# Patient Record
Sex: Female | Born: 1935
Health system: Southern US, Community
[De-identification: ages and names within clinical notes are randomized; demographics above are authoritative.]

## PROBLEM LIST (undated history)

## (undated) DIAGNOSIS — K831 Obstruction of bile duct: Secondary | ICD-10-CM

## (undated) DIAGNOSIS — M810 Age-related osteoporosis without current pathological fracture: Secondary | ICD-10-CM

## (undated) DIAGNOSIS — C169 Malignant neoplasm of stomach, unspecified: Secondary | ICD-10-CM

## (undated) DIAGNOSIS — E785 Hyperlipidemia, unspecified: Secondary | ICD-10-CM

## (undated) DIAGNOSIS — E559 Vitamin D deficiency, unspecified: Secondary | ICD-10-CM

## (undated) DIAGNOSIS — E538 Deficiency of other specified B group vitamins: Secondary | ICD-10-CM

## (undated) DIAGNOSIS — I10 Essential (primary) hypertension: Secondary | ICD-10-CM

## (undated) DIAGNOSIS — A3790 Whooping cough, unspecified species without pneumonia: Secondary | ICD-10-CM

## (undated) DIAGNOSIS — K219 Gastro-esophageal reflux disease without esophagitis: Secondary | ICD-10-CM

## (undated) DIAGNOSIS — I495 Sick sinus syndrome: Secondary | ICD-10-CM

## (undated) DIAGNOSIS — M51369 Other intervertebral disc degeneration, lumbar region without mention of lumbar back pain or lower extremity pain: Secondary | ICD-10-CM

## (undated) DIAGNOSIS — M5136 Other intervertebral disc degeneration, lumbar region: Secondary | ICD-10-CM

## (undated) DIAGNOSIS — N182 Chronic kidney disease, stage 2 (mild): Secondary | ICD-10-CM

## (undated) DIAGNOSIS — E119 Type 2 diabetes mellitus without complications: Secondary | ICD-10-CM

## (undated) DIAGNOSIS — J309 Allergic rhinitis, unspecified: Secondary | ICD-10-CM

## (undated) DIAGNOSIS — I4891 Unspecified atrial fibrillation: Secondary | ICD-10-CM

## (undated) DIAGNOSIS — H3530 Unspecified macular degeneration: Secondary | ICD-10-CM

## (undated) DIAGNOSIS — M1990 Unspecified osteoarthritis, unspecified site: Secondary | ICD-10-CM

## (undated) DIAGNOSIS — D649 Anemia, unspecified: Secondary | ICD-10-CM

## (undated) HISTORY — DX: Sick sinus syndrome: I49.5

## (undated) HISTORY — DX: Deficiency of other specified B group vitamins: E53.8

## (undated) HISTORY — DX: Other intervertebral disc degeneration, lumbar region without mention of lumbar back pain or lower extremity pain: M51.369

## (undated) HISTORY — DX: Obstruction of bile duct: K83.1

## (undated) HISTORY — DX: Anemia, unspecified: D64.9

## (undated) HISTORY — DX: Age-related osteoporosis without current pathological fracture: M81.0

## (undated) HISTORY — DX: Unspecified macular degeneration: H35.30

## (undated) HISTORY — DX: Other intervertebral disc degeneration, lumbar region: M51.36

## (undated) HISTORY — PX: BACK SURGERY: SHX140

## (undated) HISTORY — DX: Whooping cough, unspecified species without pneumonia: A37.90

## (undated) HISTORY — PX: CHOLECYSTECTOMY: SHX55

## (undated) HISTORY — DX: Unspecified osteoarthritis, unspecified site: M19.90

## (undated) HISTORY — DX: Chronic kidney disease, stage 2 (mild): N18.2

## (undated) HISTORY — DX: Vitamin D deficiency, unspecified: E55.9

## (undated) HISTORY — DX: Allergic rhinitis, unspecified: J30.9

## (undated) HISTORY — DX: Gastro-esophageal reflux disease without esophagitis: K21.9

## (undated) HISTORY — DX: Hyperlipidemia, unspecified: E78.5

## (undated) HISTORY — DX: Type 2 diabetes mellitus without complications: E11.9

## (undated) HISTORY — DX: Unspecified atrial fibrillation: I48.91

---

## 1966-01-25 HISTORY — PX: PARTIAL HYSTERECTOMY: SHX80

## 1989-01-25 HISTORY — PX: TOTAL ABDOMINAL HYSTERECTOMY: SHX209

## 1990-01-25 HISTORY — PX: PARTIAL GASTRECTOMY: SHX2172

## 1997-09-16 ENCOUNTER — Emergency Department (HOSPITAL_COMMUNITY): Admission: EM | Admit: 1997-09-16 | Discharge: 1997-09-16 | Payer: Self-pay | Admitting: Emergency Medicine

## 1997-11-08 ENCOUNTER — Other Ambulatory Visit: Admission: RE | Admit: 1997-11-08 | Discharge: 1997-11-08 | Payer: Self-pay | Admitting: Obstetrics and Gynecology

## 1998-04-04 ENCOUNTER — Emergency Department (HOSPITAL_COMMUNITY): Admission: EM | Admit: 1998-04-04 | Discharge: 1998-04-04 | Payer: Self-pay | Admitting: Emergency Medicine

## 1998-04-05 ENCOUNTER — Encounter: Payer: Self-pay | Admitting: Emergency Medicine

## 1998-12-23 ENCOUNTER — Other Ambulatory Visit: Admission: RE | Admit: 1998-12-23 | Discharge: 1998-12-23 | Payer: Self-pay | Admitting: Obstetrics and Gynecology

## 1999-03-24 ENCOUNTER — Inpatient Hospital Stay (HOSPITAL_COMMUNITY): Admission: EM | Admit: 1999-03-24 | Discharge: 1999-03-26 | Payer: Self-pay | Admitting: Cardiology

## 1999-03-25 ENCOUNTER — Encounter: Payer: Self-pay | Admitting: Cardiology

## 1999-12-31 ENCOUNTER — Other Ambulatory Visit: Admission: RE | Admit: 1999-12-31 | Discharge: 1999-12-31 | Payer: Self-pay | Admitting: Obstetrics and Gynecology

## 2000-01-04 ENCOUNTER — Encounter: Admission: RE | Admit: 2000-01-04 | Discharge: 2000-01-04 | Payer: Self-pay | Admitting: Internal Medicine

## 2000-01-04 ENCOUNTER — Encounter: Payer: Self-pay | Admitting: Internal Medicine

## 2000-02-12 ENCOUNTER — Encounter: Payer: Self-pay | Admitting: Gastroenterology

## 2000-02-12 ENCOUNTER — Encounter: Payer: Self-pay | Admitting: Emergency Medicine

## 2000-02-12 ENCOUNTER — Encounter: Payer: Self-pay | Admitting: Internal Medicine

## 2000-02-12 ENCOUNTER — Inpatient Hospital Stay (HOSPITAL_COMMUNITY): Admission: EM | Admit: 2000-02-12 | Discharge: 2000-02-15 | Payer: Self-pay | Admitting: Emergency Medicine

## 2000-04-13 ENCOUNTER — Observation Stay (HOSPITAL_COMMUNITY): Admission: EM | Admit: 2000-04-13 | Discharge: 2000-04-14 | Payer: Self-pay | Admitting: Internal Medicine

## 2000-04-13 ENCOUNTER — Encounter: Payer: Self-pay | Admitting: Internal Medicine

## 2001-01-02 ENCOUNTER — Other Ambulatory Visit: Admission: RE | Admit: 2001-01-02 | Discharge: 2001-01-02 | Payer: Self-pay | Admitting: Obstetrics and Gynecology

## 2001-01-17 ENCOUNTER — Encounter: Admission: RE | Admit: 2001-01-17 | Discharge: 2001-01-17 | Payer: Self-pay | Admitting: Internal Medicine

## 2001-01-17 ENCOUNTER — Encounter: Payer: Self-pay | Admitting: Internal Medicine

## 2002-10-22 ENCOUNTER — Encounter: Payer: Self-pay | Admitting: Internal Medicine

## 2002-10-22 ENCOUNTER — Encounter: Admission: RE | Admit: 2002-10-22 | Discharge: 2002-10-22 | Payer: Self-pay | Admitting: Internal Medicine

## 2003-03-12 ENCOUNTER — Ambulatory Visit (HOSPITAL_COMMUNITY): Admission: RE | Admit: 2003-03-12 | Discharge: 2003-03-12 | Payer: Self-pay | Admitting: Gastroenterology

## 2003-03-12 ENCOUNTER — Encounter (INDEPENDENT_AMBULATORY_CARE_PROVIDER_SITE_OTHER): Payer: Self-pay | Admitting: Specialist

## 2004-01-21 ENCOUNTER — Ambulatory Visit: Payer: Self-pay | Admitting: *Deleted

## 2004-02-10 ENCOUNTER — Encounter: Admission: RE | Admit: 2004-02-10 | Discharge: 2004-02-10 | Payer: Self-pay | Admitting: Internal Medicine

## 2004-08-27 ENCOUNTER — Ambulatory Visit (HOSPITAL_COMMUNITY): Admission: RE | Admit: 2004-08-27 | Discharge: 2004-08-27 | Payer: Self-pay | Admitting: Ophthalmology

## 2005-02-26 ENCOUNTER — Encounter: Admission: RE | Admit: 2005-02-26 | Discharge: 2005-02-26 | Payer: Self-pay | Admitting: Internal Medicine

## 2006-04-08 ENCOUNTER — Encounter: Admission: RE | Admit: 2006-04-08 | Discharge: 2006-04-08 | Payer: Self-pay | Admitting: Internal Medicine

## 2006-06-02 ENCOUNTER — Ambulatory Visit (HOSPITAL_COMMUNITY): Admission: RE | Admit: 2006-06-02 | Discharge: 2006-06-02 | Payer: Self-pay | Admitting: Internal Medicine

## 2007-05-29 ENCOUNTER — Encounter: Admission: RE | Admit: 2007-05-29 | Discharge: 2007-05-29 | Payer: Self-pay | Admitting: Internal Medicine

## 2007-07-05 ENCOUNTER — Ambulatory Visit (HOSPITAL_COMMUNITY): Admission: RE | Admit: 2007-07-05 | Discharge: 2007-07-05 | Payer: Self-pay | Admitting: Internal Medicine

## 2008-06-03 ENCOUNTER — Encounter: Admission: RE | Admit: 2008-06-03 | Discharge: 2008-06-03 | Payer: Self-pay | Admitting: Internal Medicine

## 2009-06-02 ENCOUNTER — Ambulatory Visit: Payer: Self-pay | Admitting: Surgery

## 2009-06-30 ENCOUNTER — Encounter: Admission: RE | Admit: 2009-06-30 | Discharge: 2009-06-30 | Payer: Self-pay | Admitting: Internal Medicine

## 2009-09-04 ENCOUNTER — Emergency Department (HOSPITAL_COMMUNITY): Admission: EM | Admit: 2009-09-04 | Discharge: 2009-09-04 | Payer: Self-pay | Admitting: Emergency Medicine

## 2010-06-09 NOTE — Procedures (Signed)
DUPLEX DEEP VENOUS EXAM - LOWER EXTREMITY   INDICATION:  Swelling in left leg.   HISTORY:  Edema:  Left.  Trauma/Surgery:  No.  Pain:  Left.  PE:  No.  Previous DVT:  No.  Anticoagulants:  No.  Other:   DUPLEX EXAM:                CFV   SFV   PopV  PTV    GSV                R  L  R  L  R  L  R   L  R  L  Thrombosis    o  o     o     o      o     o  Spontaneous   +  +     +     +      +     +  Phasic        +  +     +     +      +     +  Augmentation  +  +     +     +      +     +  Compressible  +  +     +     +      +     +  Competent     +  +     +     +      +     +   Legend:  + - yes  o - no  p - partial  D - decreased    IMPRESSION:  1. Left leg appears to be negative for deep venous thrombosis.  2. Inflammation noted behind left knee.   Preliminary results were called into Dr. Laurey Morale office.        _____________________________  Seth Bake. Charlena Cross, MD   NT/MEDQ  D:  06/02/2009  T:  06/02/2009  Job:  [id number]

## 2010-06-12 NOTE — Op Note (Signed)
[name] [name], [name] [name]                          ACCOUNT NO.:  [id number]   MEDICAL RECORD NO.:  [id number]                   PATIENT TYPE:  AMB   LOCATION:  ENDO                                 FACILITY:  [facility]   PHYSICIAN:  James L. Malon Kindle., M.D.          DATE OF BIRTH:  1917/06/29   DATE OF PROCEDURE:  03/12/2003  DATE OF DISCHARGE:                                 OPERATIVE REPORT   PROCEDURE:  Endoscopy with biopsy.   MEDICATIONS:  1. Cetacaine spray.  2. Fentanyl 20 mcg.  3. Versed 2 mg IV.   INDICATION:  Iron deficiency anemia in a woman who has had a previous  partial gastrectomy for lymphoma years ago.   DESCRIPTION OF PROCEDURE:  The procedure had been explained to the patient  and consent obtained.  The patient in the left lateral decubitus position,  the Olympus scope was inserted and advanced under direct visualization.  The  prep was quite good.  There was still some sticky adherent food material in  the stomach.  The gastric pouch was dramatically inflamed with marked  redness.  The gastric outlet was wide open, and both limbs of the  gastrojejunostomy were entered and were completely normal for 10 cm.  The  scope was withdrawn back in the stomach.  There were no ulcerations, and  multiple biopsies were taken.  Mucosa appeared friable, consistent with  gastritis.  The scope was withdrawn.  The distal and proximal and  esophagitis were endoscopically normal.  The patient tolerated the procedure  well.   ASSESSMENT:  1. Gastritis.  535.00.  2. Iron deficiency anemia.  280.0.   PLAN:  1. We will start her on oral iron.  2. We will check path results, give her Carafate empirically.  3. Proceed with colonoscopy as planned.                                               James L. Malon Kindle., M.D.    Waldron Session  D:  03/12/2003  T:  03/12/2003  Job:  [id number]   cc:   Loraine Leriche A. Waynard Edwards, M.D.  [address]  [place]  [place] [address]  Fax: [phone]

## 2010-06-12 NOTE — Op Note (Signed)
NAMEANEEKA, BOWDEN                          ACCOUNT NO.:  [id number]   MEDICAL RECORD NO.:  [id number]                   PATIENT TYPE:  AMB   LOCATION:  ENDO                                 FACILITY:  [facility]   PHYSICIAN:  James L. Malon Kindle., M.D.          DATE OF BIRTH:  April 09, 1917   DATE OF PROCEDURE:  03/12/2003  DATE OF DISCHARGE:                                 OPERATIVE REPORT   PROCEDURE:  Colonoscopy.   MEDICATIONS:  Fentanyl 50 mcg, Versed 5 mg IV.   INDICATIONS:  Iron-deficiency anemia.  Endoscopy just before this showed  marked gastritis in former postop stomach.   SCOPE:  Pediatric colonoscope.   DESCRIPTION OF PROCEDURE:  The procedure had been explained to the patient  and consent obtained.  With the patient in the left lateral decubitus  position, the Olympus scope was inserted and advanced.  We advanced easily  to the cecum.  The ileocecal valve and appendiceal orifice were seen.  The  scope was withdrawn and the cecum, ascending colon, transverse colon,  descending and sigmoid colon were seen well.  No polyps were seen.  There  was moderate diverticulosis in the sigmoid colon.  The rectum was free of  polyps with minimal hemorrhoids.  The patient tolerated the procedure well.  Was maintained on low-flow oxygen and pulse oximetry throughout the  procedure with no obvious problems.   ASSESSMENT:  1. Iron-deficiency anemia, 280.0.  2. Diverticulosis, 562.10.   PLAN:  Will treat with oral iron.  See back in the office in two months.                                               James L. Malon Kindle., M.D.    Waldron Session  D:  03/12/2003  T:  03/12/2003  Job:  [id number]   cc:   Loraine Leriche A. Waynard Edwards, M.D.  [address]  [place]  [place] [address]  Fax: [phone]

## 2010-06-12 NOTE — H&P (Signed)
[facility]  Patient:    [name] [name], [name] [name]                         MRN: [id number] Adm. Date:  [id number] Attending:  Estella [name] Dictator:   Mike Gip, P.A.-C. CC:         [name] [name], M.D.   History and Physical  CHIEF COMPLAINT:  Bile duct stones.  HISTORY:  [name] [name] is a very nice 77 year old white female, generally in good health, who does have a history of hypertension and remote Billroth II anastomosis; she is also status post remote open cholecystectomy.  Patient originally was admitted to [facility], February 12, 2000, with acute cholangitis associated with E. coli bacteremia.  She was found to have multiple common bile duct stones on ERCP per Dr. Barbette Hair. [name] [name]; however, the stones were large and unable to be removed and therefore a stent was placed with plans for followup ERCP in four to six weeks.  Patient did take a course of antibiotic at the time of discharge.  In the interim, she has done well, her jaundice has resolved and she has been feeling well without any complaints of abdominal pain, nausea, vomiting or fever.  She had seen Dr. Wilhemina [name]. [name] Keys., who has had prior experience with Billroth II anatomy, and plans were made for him to complete her followup ERCP.  ERCP this morning was successful with removal of multiple stones, sphincterotomy was performed and her stent was removed.  She was noted to have a stricture of the left hepatic duct just above the bifurcation and stones above the stricture.  These were not removed despite passing a balloon above the stricture.  Patient has tolerated the procedure well, is pain free at this time and is admitted overnight for IV antibiotics and observation.  CURRENT MEDICATIONS: 1. Metoprolol 25 b.i.d. 2. Baby aspirin daily. 3. Vitamins daily. 4. Lorazepam 0.5 mg p.r.n.  ALLERGIES:  None.  PAST HISTORY:  In addition to above, she is also status partial hysterectomy and  laminectomy.  FAMILY HISTORY:  Negative for GI disease.  SOCIAL HISTORY:  The patient is married, retired and has four grown children. No ETOH and no tobacco.  REVIEW OF SYSTEMS:  CARDIOVASCULAR:  Denies any chest pain or anginal symptoms.  PULMONARY:  Negative for cough, shortness of breath or sputum production.  GENITOURINARY:  Negative for dysuria, urgency or frequency.  GI: As listed above.  MUSCULOSKELETAL:  Pertinent for arthritic symptoms.  PHYSICAL EXAMINATION:  GENERAL:  Well-developed, elderly white female, healthy appearing in no acute distress.  She is comfortable post procedure, alert and oriented.  VITAL SIGNS:  Temperature is 97.4, blood pressure 194/84, pulse is 76.  HEENT:  Non-traumatic, normocephalic.  EOMI.  PERLA.  Sclerae anicteric.  NECK:  There is no JVD and no nodes.  CARDIOVASCULAR:  Regular rate and rhythm with S1 and S2.  No murmur, rub or gallop.  PULMONARY:  Clear to A&P.  ABDOMEN:  Soft.  Bowel sounds are active.  She is basically nontender.  There is no palpable mass or hepatosplenomegaly.  She does have midline incisional scars and prior cholecystectomy scar.  RECTAL:  Exam is not done at this time.  IMPRESSION: 1. [age]-year-old white female, status post endoscopic retrograde    cholangiopancreatogram, stent removal, sphincterotomy and extraction of    multiple common bile duct stones. 2. Left common hepatic duct stone/stricture, probably asymptomatic. 3. Remote cholecystectomy. 4.  Remote Billroth II. 5. Hypertension. 6. Hysterectomy. 7. Laminectomy.  PLAN:  Patient is admitted overnight for observation to the service of Dr. Yancey [name].  She will be kept on a clear liquid diet, IV fluids, will cover her with IV Unasyn, will maintain her home medications and plan discharge to home in a.m., assuming she remains stable and pain free.DD: 04/13/00 TD:  04/14/00 Job: [id number]

## 2010-06-12 NOTE — H&P (Signed)
[facility]  Patient:    [name] [name], [name] [name]                         MRN: [id number] Adm. Date:  [id number] Attending:  Rodrigo Ran A                         History and Physical  CHIEF COMPLAINT:  Abdominal pain and vomiting.  HISTORY OF PRESENT ILLNESS:  [name] [name] is an 77 year old female with past history as below who developed some nausea and vomiting and mild right upper quadrant and epigastric pain at approximately 5 in the morning on February 11, 2000.  However, this eased off and the patient did well for most of the day until approximately 10 p.m. when she developed acute severe abdominal pain and persistent nausea and vomiting.  The patient was advised to present to the Emergency Department and was found to have elevated LFTs very similar to an episode from February of 2001.  She is to be admitted for further management.  PAST MEDICAL HISTORY: 1. Gastric lymphoma, status post gastric resection, 1992. 2. Status post TAH-BSO and appendectomy. 3. History of lumbar disk surgery. 4. History of similar symptoms as today in February 2001, felt to have been    a common bile duct stone which could have passed. 5. History of cholecystectomy in 1963. 6. History of osteoarthritis. 7. History of angina, although no clear workup has ever been done and she    takes a very rare nitroglycerin tablet, although none recently.  ALLERGIES:  No known drug allergies.  MEDICATIONS:  Premarin, aspirin, and a rare nitroglycerin tablet and a rare Actifed tablet.  In the Emergency Department patient has been given normal saline, morphine, and Phenergan.  SOCIAL HISTORY:  Patient is married.  She denies tobacco, alcohol, or drug use.  FAMILY HISTORY:  Father died at age 74 of congestive heart failure.  Mother died at age 14 after childbirth.  REVIEW OF SYSTEMS:  There has been no fever, diarrhea, or constipation.  There has been no blood seen from above or below.   Patient has had normal brown stools.  She denies any dysuria or other urinary symptoms.  Her weight has been stable recently.  She has had no other acute problems.  She did feel some irregularity of her heart rate tonight when she had the pain, but she denies any chest pain, shortness of breath, or edema.  PHYSICAL EXAMINATION:  VITAL SIGNS:  Blood pressure 158/73, pulse 70, respiratory rate 18, temperature 97.6, oxygen saturation 98% on room air.  GENERAL:  She is lying supine.  She is a little sleepy from her Phenergan, but is in no acute distress.  She is still heaving and gagging a little.  HEENT:  Pupils are equally round and reactive to light.  There is no clear icterus seen, nor is there pallor.  There is no JVD.  HEART:  Patient is currently in normal sinus rhythm, both by exam and by telemetry.  Heart is regular with a 1/6 murmur at the left sternal border.  LUNGS:  Clear to auscultation bilaterally with good air movement.  ABDOMEN:  There are decreased bowel sounds, but there are some bowel sounds present.  There is significant right upper quadrant tenderness with perhaps some guarding in the right upper quadrant, but otherwise, the abdomen is soft with no rebound.  EXTREMITIES:  There is no clubbing, cyanosis, or  edema.  There are good pulses throughout.  NEUROLOGIC:  Patient is alert and oriented x 4 and appropriate.  LABORATORY DATA:  Urinalysis is essentially negative except for trace ketones and 100 g percent sugar.  White blood count is 8.7 with 92% segs.  Hemoglobin 11.5, platelet count 208,000.  BMET reveals sodium 139, potassium 3.3, chloride 107, CO2 23, BUN 20, creatinine 0.9, glucose 191, amylase 73, lipase 7.  LFTs revealed mild elevation of the total bilirubin of 1.3.  Alkaline phosphatase is elevated at 160.  AST is elevated at 260 and ALT is elevated at 106.  EKG reveals atrial fibrillation with nonspecific T-wave abnormality and slightly prolonged  QT interval.  Acute abdominal series also personally reviewed reveals no free air with a fair amount of stool, but no obstructive pattern.  There is no acute disease seen on the PA view of the chest.  ASSESSMENT/PLAN:  An 77 year old female with acute abdominal pain and elevated LFTs with an obstructive picture as in February 2001.  She is also presenting with a brief episode of atrial fibrillation, but has now converted spontaneously to normal sinus rhythm.  1. GI:  Will admit to telemetry bed, hydrate with normal saline at 100 cc    an hour.  Will keep n.p.o. except medications for now.  Will follow LFTs.    Coag studies are normal with an INR of 1.0, PTT of 31.  Will obtain an    abdominal ultrasound in the morning to assess for bile duct dilation and    will also ask GI to evaluate the patient, as she may need further imaging    or ERCP at some point.  Will place on PPI for prophylaxis and we will send    a blood culture and start patient on Zosyn as she is at high risk for    cholangitis. 2. Cardiovascular:  Place on telemetry.  Check set of cardiac enzymes and will    continue aspirin, subcu heparin, and DVT prophylaxis.  Will not do any    specific therapy for atrial fibrillation at this point, but if she remains    mildly hypertensive or it recurs, we will add a beta blocker for rate    control. 3. Fluid electrolytes:  Will replace potassium, check magnesium and phosphorus    levels. 4. Mild normocytic anemia:  No change from last year.  Will follow. 5. Condition is fair and patient is a full code status.DD:  02/12/00 TD:  02/12/00 Job: [id number]

## 2010-06-12 NOTE — Procedures (Signed)
[facility]  Patient:    [name] [name], [name] [name]                         MRN: [id number] Proc. Date: 04/13/00 Adm. Date:  [id number] Disc. Date: [id number] Attending:  Ezequiel Kayser CC:         Rodrigo Ran, M.D.   Procedure Report  PROCEDURE: 1. Endoscopic retrograde cholangiography with biliary sphincterotomy. 2. Biliary stent extraction. 3. Common bile duct stone extraction.  GASTROENTEROLOGIST:  Wilhemina Bonito. Eda Keys., M.D. [facility]  INDICATIONS:  Known choledocholithiasis with acute cholangitis.  HISTORY:  This is a pleasant 77 year old white female with a remote history of gastric lymphoma for which she underwent resection with Billroth II anastomosis, remote open cholecystectomy, and osteoarthritis, who was hospitalized in January with acute cholangeitis.  She underwent ERCP and was found to have multiple common duct stone.  Biliary stent was placed to allow drainage.  The patient improved on antibiotics. She was seen in followup in the office on March 15, 2000.  She was asymptomatic.  Followup liver tests were normal except for minimally elevated hepatic transaminase and elevated alkaline phosphatase.  She is now for ERCP with sphincterotomy and common duct stone extraction.  The procedure as well as the risks, benefits, and alternatives were discussed in great detail.  She understood and agreed to proceed.  PHYSICAL EXAMINATION:  GENERAL:  Well appearing female in no acute distress. She is alert and oriented.  VITAL SIGNS:  Stable.  LUNGS:  Clear.  HEART:  Regular.  ABDOMEN:  Soft.  DESCRIPTION OF PROCEDURE:  After informed consent was obtained, the patient was sedated over the course of the procedure with 100 mg of Demerol and 10 mg of Versed IV.   Unasyn 1.5 g IV was given preprocedure.   Glucagon 1.0 mg was given as a duodenal relaxant.  The Olympus therapeutic side viewing endoscope was then passed blindly into the esophagus.  The stomach  revealed gastric remnant Billroth II anatomy.   The efferent limb was entered and the previously placed biliary stent observed to be protruding into the duodenal lumen.  Stat radiograph of the abdomen with the stent and endoscope in position was obtained.  Subsequently, an HPC 2 needle knife was used to perform a biliary sphincterotomy.  The sphincterotomy was made by cutting over the biliary stent in the "6 oclock" position.  The sphincterotomy was deemed large.  The previously placed stent was then removed using a retrieval basket.  The common duct was then selectively and freely cannulated with the biliary basket.  Contrast was injected through the basket.  Filling of the biliary tree revealed a dilated system with multiple common duct stones.  The basket as well as the balloon was used to clear the common duct.  Occlusion cholangiogram demonstrated a stricture at the takeoff of the left hepatic duct junction with the common hepatic duct.  Above this, multiple smaller stones were noted.  A guidewire was passed beyond the stones into the left system. The attempted extraction of the stones was made.  However, the stones would not pass through the small stricture despite multiple attempts.  The right system was devoid of any filling defects.  IMPRESSION: 1. Choledocholithiasis status post biliary sphincterotomy, biliary stent removal, and common bile duct stone removal with complete clearance of the common duct. 2. Stricture in the region of the common hepatic duct and left hepatic duct    with residual stones proximally. 3.  Status post Billroth II gastrectomy for lymphoma.  RECOMMENDATIONS: 1. Admit for observation. 2. Antibiotics overnight. 3. Advance diet as tolerated. 4. Anticipate discharge thereafter. DD:  04/13/00 TD:  04/13/00 Job: [id number]

## 2010-06-12 NOTE — Discharge Summary (Signed)
[facility]  Patient:    [name] [name], [name] [name]                         MRN: [id number] Adm. Date:  [id number] Disc. Date: 02/15/00 Attending:  Ezequiel Kayser CC:         Reece Agar, M.D.  Barbette Hair. Arlyce Dice, M.D. [facility]  John N. Eda Keys., M.D. [facility]   Discharge Summary  PRIMARY CARE PHYSICIAN:  Dr. Rodrigo Ran.  DISCHARGE DIAGNOSES: 1. Cholangitis secondary to common bile duct stones which were multiple,    large, and unable to be removed, therefore, a stent was placed in her    common bile duct this admission. 2. Escherichia coli bacteremia secondary to #1. 3. Mild normocytic anemia, chronic. 4. Past history of gastric lymphoma, status post resection in 1992, no    evidence of recurrence. 5. History of angina with no specific cardiac workup, not active currently. 6. Brief episode of atrial fibrillation on admission, no evidence of    recurrence of this during the remainder of the admission. 7. Osteoarthritis. 8. Mildly hypertensive this admission, felt secondary to volume expansion with    normal saline. 9. Hypokalemia, replaced on admission.  PROCEDURES: 1. Endoscopic retrograde cholangiopancreatography with stent placement. 2. Abdominal ultrasound.  DISCHARGE MEDICATIONS: 1. Ciprofloxacin 500 mg p.o. b.i.d. x 6 days. 2. Metoprolol 25 mg b.i.d. 3. Darvocet-N 100 one or two q.6h. p.r.n. pain. 4. The patient is not to resume her Premarin until further directed or    discussed in clinic. 5. She may take her baby coated aspirin as before.  HISTORY OF PRESENT ILLNESS:  [name] [name] is a pleasant 77 year old female with a history of possible angina long-term as an outpatient, and gastric lymphoma status post resection.  She presented with severe epigastric abdominal pain and nausea and vomiting early in the morning on February 12, 2000.  She also had elevated liver function tests and an obstructive pattern, and this was very similar to an episode from  February 2001.  She was admitted with possible common bile duct stone with cholangitis.  Blood culture was sent, and she was placed on Zosyn.  HOSPITAL COURSE:  [name] [name] subsequently underwent abdominal ultrasound which showed a dilated common bile duct.  Gastroenterology was consulted, and the patient subsequently underwent endoscopic retrograde cholangiopancreatography which showed multiple large common bile duct stones, at least numbering five total.  These were unable to be removed, but a stent was placed.  Subsequently, the patient had a marked improvement in her symptoms, and over the next few days gradual improvement in her liver function tests.  Blood culture did show E. coli and she was continued on antibiotics. The patient remained stable during the remainder of her hospital course.  She had no recurrence of atrial fibrillation which she was briefly in in the emergency room on presentation.  The patient did have some mild hypertension, although this was felt to be largely due to volume expansion from normal saline that she received during this hospital admission, as she has not had a history of this previously.  DISCHARGE LABORATORY DATA:  White blood cell count 5.0 with a normal differential.  Hemoglobin 10.1 with MCV 95, platelet count 155,000.  Basic metabolic panel was within normal limits.  Liver function tests showed a total bilirubin of 1.9, alkaline phosphatase of 138, ALT 44, AST 31, total protein 5.6, albumin 2.4.  ACTIVITY:  The patient can be up as tolerated.  DIET:  She is to eat small portions for the next few weeks.  Low fat, low cholesterol foods.  She is to call if she has any recurrent problems.  FOLLOWUP:  She is to follow up on March 15, 2000, at 10:45 a.m. with Dr. Yancey Flemings at [facility].  Furthermore, she is to call [phone] for a follow-up appointment with Dr. Waynard Edwards in two weeks to follow blood pressure and anemia. DD:  02/15/00 TD:   02/15/00 Job: [id number]

## 2010-06-12 NOTE — Op Note (Signed)
NAMECHERITY, BLICKENSTAFF                ACCOUNT NO.:  [id number]   MEDICAL RECORD NO.:  [id number]          PATIENT TYPE:  AMB   LOCATION:  DAY                           FACILITY:  [facility]   PHYSICIAN:  Trish Fountain, MD    DATE OF BIRTH:  May 02, 1917   DATE OF PROCEDURE:  08/27/2004  DATE OF DISCHARGE:                                 OPERATIVE REPORT   /PREOPERATIVE DIAGNOSIS:  Cataract, left eye.   POSTOPERATIVE DIAGNOSIS:  Cataract, left eye.   SURGERY:  Kelman phacoemulsification, left eye, with posterior chamber  intraocular lens, left eye.   ANESTHESIA:  MAC with topical anesthesia of the left eye.   SURGEON:  Trish Fountain, MD   SPECIMENS:  None.   COMPLICATIONS:  None.   HISTORY:  This is an 77 year old female who has slowly progressive decrease  in vision in the left eye.   Lens model is AMO CLRFLXC 21.0 diopter lens, serial # [id number].   DESCRIPTION OF PROCEDURE:  In the preoperative area, the patient had  Cyclogyl and Neo-Synephrine drops in the left eye in order to dilate the eye  along with Tetracaine to help anesthetize the eye.  Once the patient's left  eye was dilated, the patient was taken to the operating room and prepped.  The left eye was prepped and draped in the usual sterile manner.  A lid  speculum was placed in the left eye, and 2% Xylocaine jelly was placed in  the left eye as well.  A paracentesis was made through clear cornea at the  limbus at approximately the 5 o'clock position of the left eye.  Nonpreserved Xylocaine 1% 1 cc was placed into the anterior chamber for one  minute.  Viscoat was then used to fill the anterior chamber.  Using a 2.75  mm blade at the 3 o'clock position, an incision into the anterior chamber  was made through clear cornea near the limbus.  Viscoat was again used to  reform the anterior chamber.  A 25 gauge bent capsulotomy needle was used to  begin the capsulorrhexis through the anterior capsule of the lens.  Utrata  forceps were used to make a 360 degree anterior capsulorrhexis.  A Chang 27  gauge irrigating cannula was used to hydrodissect and hydrodelineate the  nucleus.  Once hydrodissection and hydrodelineation was carried out, [facility]  phacoemulsification was used to make a deep groove in the lens nucleus.  The  lens was rotated 360 degrees and divided into four quadrants using deep  grooves made by phacoemulsification with the [facility] phacoemulsification tip.  The nucleus was then divided using the phaco tip and the nucleus  manipulator.  The nuclear quadrants were then removed using  phacoemulsification.  The irrigation aspiration was then used to remove the  remainder of the cortex.  The anterior chamber and posterior capsule were  filled with Provisc, and the 3 o'clock position incision was slightly  widened, using the same 2.75 mm blade that was initially used to make the  incision.  An intraocular lens was placed in the shooter, and this was  placed in the eye, followed by placement of the trailing haptic into the  posterior capsule, using the Kugelan.  Irrigation/aspiration was then used  to remove Provisc from the anterior chamber and the posterior capsule.  BSS  on a syringe was then used to hydrate the cornea at the 3 o'clock incision  site.  The incision site was then checked for water tightness, using a Weck-  cel.  Half-strength Betadine solution was placed, 1 drop, in the inner  canthus, and 1 drop in the outer canthus.  After one minute, this was rinsed  from the eye.  Drops were placed in the eye, Vigamox, followed by Nevanac  followed by Econopred.  A shield was placed over the patient's left eye, and  the patient was sent to the recovery room in satisfactory condition.       PVK/MEDQ  D:  08/27/2004  T:  08/27/2004  Job:  [id number]

## 2010-06-12 NOTE — Discharge Summary (Signed)
[facility]  Patient:    [name] [name], [name] [name]                         MRN: [id number] Adm. Date:  [id number] Disc. Date: [id number] Attending:  Estella Husk Dictator:   Mike Gip, P.A.-C. CC:         Wilhemina Bonito. Eda Keys., M.D. [facility]  Rodrigo Ran, M.D.   Discharge Summary  ADMITTING DIAGNOSES: 1. An 77 year old female status post endoscopic retrograde    cholangiopancreatography, stent removal, sphincterotomy, and extraction    of multiple common bile duct stones, admitted for observation. 2. Left common hepatic duct stricture and retained small stones. 3. Remote cholecystectomy. 4. Remote Billroth II. 5. Hypertension. 6. Status post hysterectomy and laminectomy.  DISCHARGE DIAGNOSES: 1. Stable status post endoscopic retrograde cholangiopancreatography,    stent removal, stone extraction of multiple common bile duct stones, and    sphincterotomy. 2. Left common hepatic duct stricture and retained small stones. 3. Remote cholecystectomy. 4. Remote Billroth II. 5. Hypertension. 6. Status post hysterectomy and laminectomy.  CONSULTANTS:  None.  PROCEDURES:  Endoscopic retrograde cholangiopancreatography per Dr. Marina Goodell April 13, 2000.  BRIEF HISTORY:  [name] [name] is a very nice 77 year old white female currently a primary patient of Dr. Rodrigo Ran.  She is generally in good health.  She does have history of hypertension, remote Billroth II, and cholecystectomy.  She was originally admitted through the hospital in January 2002 with acute cholangitis and was found to have multiple common bile duct stones on ERCP per Dr. Arlyce Dice.  The stones were large and unable to be removed and therefore a stent was placed with plans for repeat ERCP in four weeks.  Dr. Marina Goodell has seen in the interim due to prior experience with Billroth II anatomy.  Her jaundice has completely resolved and she has been feeling well over the past several weeks without any complaints of  abdominal pain, nausea, vomiting, or fever.  She is brought to the hospital today for ERCP with Dr. Marina Goodell.  This has been completed and was successful with removal of multiple common bile duct stones, sphincterotomy was done, and stent was removed.  The patient was noted to have a left common hepatic duct stricture and probable small stones above this stricture which were not removed despite passage of a balloon above the stricture.  The patient tolerated the procedure well and was admitted to the hospital overnight for IV antibiotics and observation.  LABORATORY DATA:  On admission, WBC 7.8, hemoglobin 10.4, hematocrit of 30.4, MCV of 91, platelets of 171,000.  Electrolytes within normal limits.  Glucose 111, BUN 16, creatinine 0.8, total bilirubin 0.5, alkaline phosphatase 187. SGOT of 124, SGPT of 79, albumin of 3.2.  HOSPITAL COURSE:  The patient was admitted to the services of Dr. Yancey Flemings for overnight observation post-ERCP, stone extraction, sphincterotomy, and stent removal.  The patient was covered with IV Unasyn.  She was initially kept on a clear liquid diet and then advanced to a regular diet the following morning.  She had a very benign hospital course.  She had no abdominal discomfort postprocedure and the morning following procedure is feeling good.  Labs as mentioned above.  She is felt stable for discharge to home.  DISCHARGE INSTRUCTIONS:  Follow up with Dr. Marina Goodell in the office on May 05, 2000 and call if any problems in the interim with abdominal pain, fever, nausea, vomiting, or recurrence of jaundice.  DISCHARGE MEDICATIONS:  1. Cipro 500 mg b.i.d. x 1 week. 2. Metoprolol 25 mg b.i.d. as previous. 3. Vitamins as previous. 4. She is to hold her aspirin for 10 days and then resume.  DIET:  Regular. DD:  04/14/00 TD:  04/15/00 Job: [id number]

## 2011-04-05 DIAGNOSIS — I10 Essential (primary) hypertension: Secondary | ICD-10-CM | POA: Diagnosis not present

## 2011-04-05 DIAGNOSIS — Z79899 Other long term (current) drug therapy: Secondary | ICD-10-CM | POA: Diagnosis not present

## 2011-04-05 DIAGNOSIS — R82998 Other abnormal findings in urine: Secondary | ICD-10-CM | POA: Diagnosis not present

## 2011-04-05 DIAGNOSIS — N182 Chronic kidney disease, stage 2 (mild): Secondary | ICD-10-CM | POA: Diagnosis not present

## 2011-04-05 DIAGNOSIS — E119 Type 2 diabetes mellitus without complications: Secondary | ICD-10-CM | POA: Diagnosis not present

## 2011-04-05 DIAGNOSIS — E079 Disorder of thyroid, unspecified: Secondary | ICD-10-CM | POA: Diagnosis not present

## 2011-04-05 DIAGNOSIS — D649 Anemia, unspecified: Secondary | ICD-10-CM | POA: Diagnosis not present

## 2011-04-14 DIAGNOSIS — N182 Chronic kidney disease, stage 2 (mild): Secondary | ICD-10-CM | POA: Diagnosis not present

## 2011-04-14 DIAGNOSIS — I10 Essential (primary) hypertension: Secondary | ICD-10-CM | POA: Diagnosis not present

## 2011-04-14 DIAGNOSIS — M810 Age-related osteoporosis without current pathological fracture: Secondary | ICD-10-CM | POA: Diagnosis not present

## 2011-08-03 DIAGNOSIS — M899 Disorder of bone, unspecified: Secondary | ICD-10-CM | POA: Diagnosis not present

## 2011-08-03 DIAGNOSIS — R29890 Loss of height: Secondary | ICD-10-CM | POA: Diagnosis not present

## 2011-08-03 DIAGNOSIS — M5137 Other intervertebral disc degeneration, lumbosacral region: Secondary | ICD-10-CM | POA: Diagnosis not present

## 2011-08-03 DIAGNOSIS — M949 Disorder of cartilage, unspecified: Secondary | ICD-10-CM | POA: Diagnosis not present

## 2011-09-13 DIAGNOSIS — H52229 Regular astigmatism, unspecified eye: Secondary | ICD-10-CM | POA: Diagnosis not present

## 2011-09-13 DIAGNOSIS — H353190 Nonexudative age-related macular degeneration, unspecified eye, stage unspecified: Secondary | ICD-10-CM | POA: Diagnosis not present

## 2011-09-13 DIAGNOSIS — H5200 Hypermetropia, unspecified eye: Secondary | ICD-10-CM | POA: Diagnosis not present

## 2011-09-13 DIAGNOSIS — Z961 Presence of intraocular lens: Secondary | ICD-10-CM | POA: Diagnosis not present

## 2011-10-19 DIAGNOSIS — M810 Age-related osteoporosis without current pathological fracture: Secondary | ICD-10-CM | POA: Diagnosis not present

## 2011-10-19 DIAGNOSIS — Z23 Encounter for immunization: Secondary | ICD-10-CM | POA: Diagnosis not present

## 2011-12-06 DIAGNOSIS — M810 Age-related osteoporosis without current pathological fracture: Secondary | ICD-10-CM | POA: Diagnosis not present

## 2011-12-06 DIAGNOSIS — E119 Type 2 diabetes mellitus without complications: Secondary | ICD-10-CM | POA: Diagnosis not present

## 2011-12-06 DIAGNOSIS — E538 Deficiency of other specified B group vitamins: Secondary | ICD-10-CM | POA: Diagnosis not present

## 2011-12-06 DIAGNOSIS — E559 Vitamin D deficiency, unspecified: Secondary | ICD-10-CM | POA: Diagnosis not present

## 2011-12-06 DIAGNOSIS — I10 Essential (primary) hypertension: Secondary | ICD-10-CM | POA: Diagnosis not present

## 2011-12-06 DIAGNOSIS — E785 Hyperlipidemia, unspecified: Secondary | ICD-10-CM | POA: Diagnosis not present

## 2011-12-06 DIAGNOSIS — R82998 Other abnormal findings in urine: Secondary | ICD-10-CM | POA: Diagnosis not present

## 2011-12-16 DIAGNOSIS — N182 Chronic kidney disease, stage 2 (mild): Secondary | ICD-10-CM | POA: Diagnosis not present

## 2011-12-16 DIAGNOSIS — R82998 Other abnormal findings in urine: Secondary | ICD-10-CM | POA: Diagnosis not present

## 2011-12-16 DIAGNOSIS — Z0000 Encounter for general adult medical examination without abnormal findings: Secondary | ICD-10-CM | POA: Diagnosis not present

## 2011-12-16 DIAGNOSIS — Z79899 Other long term (current) drug therapy: Secondary | ICD-10-CM | POA: Diagnosis not present

## 2011-12-16 DIAGNOSIS — E119 Type 2 diabetes mellitus without complications: Secondary | ICD-10-CM | POA: Diagnosis not present

## 2011-12-17 DIAGNOSIS — Z1212 Encounter for screening for malignant neoplasm of rectum: Secondary | ICD-10-CM | POA: Diagnosis not present

## 2012-04-18 DIAGNOSIS — M810 Age-related osteoporosis without current pathological fracture: Secondary | ICD-10-CM | POA: Diagnosis not present

## 2012-06-26 DIAGNOSIS — E538 Deficiency of other specified B group vitamins: Secondary | ICD-10-CM | POA: Diagnosis not present

## 2012-06-26 DIAGNOSIS — IMO0002 Reserved for concepts with insufficient information to code with codable children: Secondary | ICD-10-CM | POA: Diagnosis not present

## 2012-06-26 DIAGNOSIS — B351 Tinea unguium: Secondary | ICD-10-CM | POA: Diagnosis not present

## 2012-06-26 DIAGNOSIS — Z1331 Encounter for screening for depression: Secondary | ICD-10-CM | POA: Diagnosis not present

## 2012-06-26 DIAGNOSIS — E119 Type 2 diabetes mellitus without complications: Secondary | ICD-10-CM | POA: Diagnosis not present

## 2012-06-26 DIAGNOSIS — I10 Essential (primary) hypertension: Secondary | ICD-10-CM | POA: Diagnosis not present

## 2012-06-26 DIAGNOSIS — D649 Anemia, unspecified: Secondary | ICD-10-CM | POA: Diagnosis not present

## 2012-06-26 DIAGNOSIS — M810 Age-related osteoporosis without current pathological fracture: Secondary | ICD-10-CM | POA: Diagnosis not present

## 2012-07-11 DIAGNOSIS — M79609 Pain in unspecified limb: Secondary | ICD-10-CM | POA: Diagnosis not present

## 2012-07-11 DIAGNOSIS — B351 Tinea unguium: Secondary | ICD-10-CM | POA: Diagnosis not present

## 2012-07-11 DIAGNOSIS — L608 Other nail disorders: Secondary | ICD-10-CM | POA: Diagnosis not present

## 2012-10-09 DIAGNOSIS — H353190 Nonexudative age-related macular degeneration, unspecified eye, stage unspecified: Secondary | ICD-10-CM | POA: Diagnosis not present

## 2012-10-09 DIAGNOSIS — Z961 Presence of intraocular lens: Secondary | ICD-10-CM | POA: Diagnosis not present

## 2012-10-10 DIAGNOSIS — M79609 Pain in unspecified limb: Secondary | ICD-10-CM | POA: Diagnosis not present

## 2012-10-10 DIAGNOSIS — B351 Tinea unguium: Secondary | ICD-10-CM | POA: Diagnosis not present

## 2012-10-19 DIAGNOSIS — Z23 Encounter for immunization: Secondary | ICD-10-CM | POA: Diagnosis not present

## 2012-10-19 DIAGNOSIS — M810 Age-related osteoporosis without current pathological fracture: Secondary | ICD-10-CM | POA: Diagnosis not present

## 2012-12-11 DIAGNOSIS — E538 Deficiency of other specified B group vitamins: Secondary | ICD-10-CM | POA: Diagnosis not present

## 2012-12-11 DIAGNOSIS — R809 Proteinuria, unspecified: Secondary | ICD-10-CM | POA: Diagnosis not present

## 2012-12-11 DIAGNOSIS — I10 Essential (primary) hypertension: Secondary | ICD-10-CM | POA: Diagnosis not present

## 2012-12-11 DIAGNOSIS — R82998 Other abnormal findings in urine: Secondary | ICD-10-CM | POA: Diagnosis not present

## 2012-12-11 DIAGNOSIS — M810 Age-related osteoporosis without current pathological fracture: Secondary | ICD-10-CM | POA: Diagnosis not present

## 2012-12-11 DIAGNOSIS — E119 Type 2 diabetes mellitus without complications: Secondary | ICD-10-CM | POA: Diagnosis not present

## 2012-12-18 DIAGNOSIS — E538 Deficiency of other specified B group vitamins: Secondary | ICD-10-CM | POA: Diagnosis not present

## 2012-12-18 DIAGNOSIS — Z124 Encounter for screening for malignant neoplasm of cervix: Secondary | ICD-10-CM | POA: Diagnosis not present

## 2012-12-18 DIAGNOSIS — E1129 Type 2 diabetes mellitus with other diabetic kidney complication: Secondary | ICD-10-CM | POA: Diagnosis not present

## 2012-12-18 DIAGNOSIS — N182 Chronic kidney disease, stage 2 (mild): Secondary | ICD-10-CM | POA: Diagnosis not present

## 2012-12-18 DIAGNOSIS — E079 Disorder of thyroid, unspecified: Secondary | ICD-10-CM | POA: Diagnosis not present

## 2012-12-18 DIAGNOSIS — B029 Zoster without complications: Secondary | ICD-10-CM | POA: Diagnosis not present

## 2012-12-18 DIAGNOSIS — Z23 Encounter for immunization: Secondary | ICD-10-CM | POA: Diagnosis not present

## 2012-12-18 DIAGNOSIS — Z0000 Encounter for general adult medical examination without abnormal findings: Secondary | ICD-10-CM | POA: Diagnosis not present

## 2012-12-18 DIAGNOSIS — R82998 Other abnormal findings in urine: Secondary | ICD-10-CM | POA: Diagnosis not present

## 2012-12-18 DIAGNOSIS — D649 Anemia, unspecified: Secondary | ICD-10-CM | POA: Diagnosis not present

## 2012-12-18 DIAGNOSIS — R609 Edema, unspecified: Secondary | ICD-10-CM | POA: Diagnosis not present

## 2012-12-18 DIAGNOSIS — B351 Tinea unguium: Secondary | ICD-10-CM | POA: Diagnosis not present

## 2012-12-19 DIAGNOSIS — L97409 Non-pressure chronic ulcer of unspecified heel and midfoot with unspecified severity: Secondary | ICD-10-CM | POA: Diagnosis not present

## 2012-12-19 DIAGNOSIS — I739 Peripheral vascular disease, unspecified: Secondary | ICD-10-CM | POA: Diagnosis not present

## 2012-12-19 DIAGNOSIS — M79609 Pain in unspecified limb: Secondary | ICD-10-CM | POA: Diagnosis not present

## 2012-12-19 DIAGNOSIS — Z1212 Encounter for screening for malignant neoplasm of rectum: Secondary | ICD-10-CM | POA: Diagnosis not present

## 2012-12-27 DIAGNOSIS — I739 Peripheral vascular disease, unspecified: Secondary | ICD-10-CM | POA: Diagnosis not present

## 2012-12-29 ENCOUNTER — Ambulatory Visit: Payer: Self-pay

## 2013-01-05 DIAGNOSIS — L97509 Non-pressure chronic ulcer of other part of unspecified foot with unspecified severity: Secondary | ICD-10-CM | POA: Diagnosis not present

## 2013-01-05 DIAGNOSIS — S91109A Unspecified open wound of unspecified toe(s) without damage to nail, initial encounter: Secondary | ICD-10-CM | POA: Diagnosis not present

## 2013-01-05 DIAGNOSIS — M21619 Bunion of unspecified foot: Secondary | ICD-10-CM | POA: Diagnosis not present

## 2013-01-08 DIAGNOSIS — L97509 Non-pressure chronic ulcer of other part of unspecified foot with unspecified severity: Secondary | ICD-10-CM | POA: Diagnosis not present

## 2013-01-08 DIAGNOSIS — I509 Heart failure, unspecified: Secondary | ICD-10-CM | POA: Diagnosis not present

## 2013-01-09 ENCOUNTER — Ambulatory Visit: Payer: Self-pay

## 2013-01-11 DIAGNOSIS — L97509 Non-pressure chronic ulcer of other part of unspecified foot with unspecified severity: Secondary | ICD-10-CM | POA: Diagnosis not present

## 2013-01-11 DIAGNOSIS — I509 Heart failure, unspecified: Secondary | ICD-10-CM | POA: Diagnosis not present

## 2013-01-15 DIAGNOSIS — I509 Heart failure, unspecified: Secondary | ICD-10-CM | POA: Diagnosis not present

## 2013-01-15 DIAGNOSIS — L97509 Non-pressure chronic ulcer of other part of unspecified foot with unspecified severity: Secondary | ICD-10-CM | POA: Diagnosis not present

## 2013-01-22 DIAGNOSIS — L97509 Non-pressure chronic ulcer of other part of unspecified foot with unspecified severity: Secondary | ICD-10-CM | POA: Diagnosis not present

## 2013-01-22 DIAGNOSIS — S91109A Unspecified open wound of unspecified toe(s) without damage to nail, initial encounter: Secondary | ICD-10-CM | POA: Diagnosis not present

## 2013-01-22 DIAGNOSIS — M21619 Bunion of unspecified foot: Secondary | ICD-10-CM | POA: Diagnosis not present

## 2013-01-26 DIAGNOSIS — I509 Heart failure, unspecified: Secondary | ICD-10-CM | POA: Diagnosis not present

## 2013-01-26 DIAGNOSIS — L97509 Non-pressure chronic ulcer of other part of unspecified foot with unspecified severity: Secondary | ICD-10-CM | POA: Diagnosis not present

## 2013-02-03 DIAGNOSIS — I509 Heart failure, unspecified: Secondary | ICD-10-CM | POA: Diagnosis not present

## 2013-02-03 DIAGNOSIS — L97509 Non-pressure chronic ulcer of other part of unspecified foot with unspecified severity: Secondary | ICD-10-CM | POA: Diagnosis not present

## 2013-02-08 DIAGNOSIS — L97509 Non-pressure chronic ulcer of other part of unspecified foot with unspecified severity: Secondary | ICD-10-CM | POA: Diagnosis not present

## 2013-02-08 DIAGNOSIS — S91109A Unspecified open wound of unspecified toe(s) without damage to nail, initial encounter: Secondary | ICD-10-CM | POA: Diagnosis not present

## 2013-02-13 DIAGNOSIS — I509 Heart failure, unspecified: Secondary | ICD-10-CM | POA: Diagnosis not present

## 2013-02-13 DIAGNOSIS — L97509 Non-pressure chronic ulcer of other part of unspecified foot with unspecified severity: Secondary | ICD-10-CM | POA: Diagnosis not present

## 2013-02-21 DIAGNOSIS — L97509 Non-pressure chronic ulcer of other part of unspecified foot with unspecified severity: Secondary | ICD-10-CM | POA: Diagnosis not present

## 2013-02-21 DIAGNOSIS — I509 Heart failure, unspecified: Secondary | ICD-10-CM | POA: Diagnosis not present

## 2013-02-26 DIAGNOSIS — L97509 Non-pressure chronic ulcer of other part of unspecified foot with unspecified severity: Secondary | ICD-10-CM | POA: Diagnosis not present

## 2013-02-26 DIAGNOSIS — S91109A Unspecified open wound of unspecified toe(s) without damage to nail, initial encounter: Secondary | ICD-10-CM | POA: Diagnosis not present

## 2013-03-29 DIAGNOSIS — S91109A Unspecified open wound of unspecified toe(s) without damage to nail, initial encounter: Secondary | ICD-10-CM | POA: Diagnosis not present

## 2013-03-29 DIAGNOSIS — I739 Peripheral vascular disease, unspecified: Secondary | ICD-10-CM | POA: Diagnosis not present

## 2013-03-29 DIAGNOSIS — L97509 Non-pressure chronic ulcer of other part of unspecified foot with unspecified severity: Secondary | ICD-10-CM | POA: Diagnosis not present

## 2013-03-29 DIAGNOSIS — Q6689 Other  specified congenital deformities of feet: Secondary | ICD-10-CM | POA: Diagnosis not present

## 2013-03-30 DIAGNOSIS — S91109A Unspecified open wound of unspecified toe(s) without damage to nail, initial encounter: Secondary | ICD-10-CM | POA: Diagnosis not present

## 2013-03-30 DIAGNOSIS — L97409 Non-pressure chronic ulcer of unspecified heel and midfoot with unspecified severity: Secondary | ICD-10-CM | POA: Diagnosis not present

## 2013-03-30 DIAGNOSIS — L97509 Non-pressure chronic ulcer of other part of unspecified foot with unspecified severity: Secondary | ICD-10-CM | POA: Diagnosis not present

## 2013-04-02 DIAGNOSIS — L97509 Non-pressure chronic ulcer of other part of unspecified foot with unspecified severity: Secondary | ICD-10-CM | POA: Diagnosis not present

## 2013-04-02 DIAGNOSIS — S91109A Unspecified open wound of unspecified toe(s) without damage to nail, initial encounter: Secondary | ICD-10-CM | POA: Diagnosis not present

## 2013-04-04 DIAGNOSIS — L97509 Non-pressure chronic ulcer of other part of unspecified foot with unspecified severity: Secondary | ICD-10-CM | POA: Diagnosis not present

## 2013-04-04 DIAGNOSIS — S91109A Unspecified open wound of unspecified toe(s) without damage to nail, initial encounter: Secondary | ICD-10-CM | POA: Diagnosis not present

## 2013-04-06 DIAGNOSIS — L97509 Non-pressure chronic ulcer of other part of unspecified foot with unspecified severity: Secondary | ICD-10-CM | POA: Diagnosis not present

## 2013-04-09 DIAGNOSIS — H52229 Regular astigmatism, unspecified eye: Secondary | ICD-10-CM | POA: Diagnosis not present

## 2013-04-09 DIAGNOSIS — L97509 Non-pressure chronic ulcer of other part of unspecified foot with unspecified severity: Secondary | ICD-10-CM | POA: Diagnosis not present

## 2013-04-09 DIAGNOSIS — Z961 Presence of intraocular lens: Secondary | ICD-10-CM | POA: Diagnosis not present

## 2013-04-09 DIAGNOSIS — S91109A Unspecified open wound of unspecified toe(s) without damage to nail, initial encounter: Secondary | ICD-10-CM | POA: Diagnosis not present

## 2013-04-09 DIAGNOSIS — H5200 Hypermetropia, unspecified eye: Secondary | ICD-10-CM | POA: Diagnosis not present

## 2013-04-09 DIAGNOSIS — H353190 Nonexudative age-related macular degeneration, unspecified eye, stage unspecified: Secondary | ICD-10-CM | POA: Diagnosis not present

## 2013-04-12 DIAGNOSIS — L97509 Non-pressure chronic ulcer of other part of unspecified foot with unspecified severity: Secondary | ICD-10-CM | POA: Diagnosis not present

## 2013-04-12 DIAGNOSIS — S91109A Unspecified open wound of unspecified toe(s) without damage to nail, initial encounter: Secondary | ICD-10-CM | POA: Diagnosis not present

## 2013-04-18 DIAGNOSIS — L97509 Non-pressure chronic ulcer of other part of unspecified foot with unspecified severity: Secondary | ICD-10-CM | POA: Diagnosis not present

## 2013-04-18 DIAGNOSIS — S91109A Unspecified open wound of unspecified toe(s) without damage to nail, initial encounter: Secondary | ICD-10-CM | POA: Diagnosis not present

## 2013-04-18 DIAGNOSIS — M810 Age-related osteoporosis without current pathological fracture: Secondary | ICD-10-CM | POA: Diagnosis not present

## 2013-04-20 DIAGNOSIS — L97509 Non-pressure chronic ulcer of other part of unspecified foot with unspecified severity: Secondary | ICD-10-CM | POA: Diagnosis not present

## 2013-04-20 DIAGNOSIS — L97409 Non-pressure chronic ulcer of unspecified heel and midfoot with unspecified severity: Secondary | ICD-10-CM | POA: Diagnosis not present

## 2013-04-25 DIAGNOSIS — L97509 Non-pressure chronic ulcer of other part of unspecified foot with unspecified severity: Secondary | ICD-10-CM | POA: Diagnosis not present

## 2013-04-25 DIAGNOSIS — S91109A Unspecified open wound of unspecified toe(s) without damage to nail, initial encounter: Secondary | ICD-10-CM | POA: Diagnosis not present

## 2013-04-26 DIAGNOSIS — Z1331 Encounter for screening for depression: Secondary | ICD-10-CM | POA: Diagnosis not present

## 2013-04-26 DIAGNOSIS — E538 Deficiency of other specified B group vitamins: Secondary | ICD-10-CM | POA: Diagnosis not present

## 2013-04-26 DIAGNOSIS — R7301 Impaired fasting glucose: Secondary | ICD-10-CM | POA: Diagnosis not present

## 2013-04-26 DIAGNOSIS — I10 Essential (primary) hypertension: Secondary | ICD-10-CM | POA: Diagnosis not present

## 2013-04-26 DIAGNOSIS — L97509 Non-pressure chronic ulcer of other part of unspecified foot with unspecified severity: Secondary | ICD-10-CM | POA: Diagnosis not present

## 2013-04-26 DIAGNOSIS — M810 Age-related osteoporosis without current pathological fracture: Secondary | ICD-10-CM | POA: Diagnosis not present

## 2013-04-26 DIAGNOSIS — E119 Type 2 diabetes mellitus without complications: Secondary | ICD-10-CM | POA: Diagnosis not present

## 2013-04-26 DIAGNOSIS — IMO0002 Reserved for concepts with insufficient information to code with codable children: Secondary | ICD-10-CM | POA: Diagnosis not present

## 2013-05-01 DIAGNOSIS — S91109A Unspecified open wound of unspecified toe(s) without damage to nail, initial encounter: Secondary | ICD-10-CM | POA: Diagnosis not present

## 2013-05-01 DIAGNOSIS — L97509 Non-pressure chronic ulcer of other part of unspecified foot with unspecified severity: Secondary | ICD-10-CM | POA: Diagnosis not present

## 2013-05-04 DIAGNOSIS — L97409 Non-pressure chronic ulcer of unspecified heel and midfoot with unspecified severity: Secondary | ICD-10-CM | POA: Diagnosis not present

## 2013-05-04 DIAGNOSIS — L97509 Non-pressure chronic ulcer of other part of unspecified foot with unspecified severity: Secondary | ICD-10-CM | POA: Diagnosis not present

## 2013-05-10 DIAGNOSIS — S91109A Unspecified open wound of unspecified toe(s) without damage to nail, initial encounter: Secondary | ICD-10-CM | POA: Diagnosis not present

## 2013-05-10 DIAGNOSIS — L97509 Non-pressure chronic ulcer of other part of unspecified foot with unspecified severity: Secondary | ICD-10-CM | POA: Diagnosis not present

## 2013-05-21 DIAGNOSIS — L97409 Non-pressure chronic ulcer of unspecified heel and midfoot with unspecified severity: Secondary | ICD-10-CM | POA: Diagnosis not present

## 2013-05-21 DIAGNOSIS — L97509 Non-pressure chronic ulcer of other part of unspecified foot with unspecified severity: Secondary | ICD-10-CM | POA: Diagnosis not present

## 2013-06-11 DIAGNOSIS — L97509 Non-pressure chronic ulcer of other part of unspecified foot with unspecified severity: Secondary | ICD-10-CM | POA: Diagnosis not present

## 2013-06-11 DIAGNOSIS — L97409 Non-pressure chronic ulcer of unspecified heel and midfoot with unspecified severity: Secondary | ICD-10-CM | POA: Diagnosis not present

## 2013-07-02 DIAGNOSIS — L97409 Non-pressure chronic ulcer of unspecified heel and midfoot with unspecified severity: Secondary | ICD-10-CM | POA: Diagnosis not present

## 2013-07-02 DIAGNOSIS — L97509 Non-pressure chronic ulcer of other part of unspecified foot with unspecified severity: Secondary | ICD-10-CM | POA: Diagnosis not present

## 2013-07-09 DIAGNOSIS — L89509 Pressure ulcer of unspecified ankle, unspecified stage: Secondary | ICD-10-CM | POA: Diagnosis not present

## 2013-07-09 DIAGNOSIS — L8992 Pressure ulcer of unspecified site, stage 2: Secondary | ICD-10-CM | POA: Diagnosis not present

## 2013-07-13 DIAGNOSIS — L89509 Pressure ulcer of unspecified ankle, unspecified stage: Secondary | ICD-10-CM | POA: Diagnosis not present

## 2013-07-13 DIAGNOSIS — L8992 Pressure ulcer of unspecified site, stage 2: Secondary | ICD-10-CM | POA: Diagnosis not present

## 2013-07-16 DIAGNOSIS — L97409 Non-pressure chronic ulcer of unspecified heel and midfoot with unspecified severity: Secondary | ICD-10-CM | POA: Diagnosis not present

## 2013-07-16 DIAGNOSIS — L97509 Non-pressure chronic ulcer of other part of unspecified foot with unspecified severity: Secondary | ICD-10-CM | POA: Diagnosis not present

## 2013-07-19 DIAGNOSIS — L8992 Pressure ulcer of unspecified site, stage 2: Secondary | ICD-10-CM | POA: Diagnosis not present

## 2013-07-19 DIAGNOSIS — L89509 Pressure ulcer of unspecified ankle, unspecified stage: Secondary | ICD-10-CM | POA: Diagnosis not present

## 2013-07-24 DIAGNOSIS — L89509 Pressure ulcer of unspecified ankle, unspecified stage: Secondary | ICD-10-CM | POA: Diagnosis not present

## 2013-07-24 DIAGNOSIS — L8992 Pressure ulcer of unspecified site, stage 2: Secondary | ICD-10-CM | POA: Diagnosis not present

## 2013-07-30 DIAGNOSIS — L97409 Non-pressure chronic ulcer of unspecified heel and midfoot with unspecified severity: Secondary | ICD-10-CM | POA: Diagnosis not present

## 2013-07-30 DIAGNOSIS — L97509 Non-pressure chronic ulcer of other part of unspecified foot with unspecified severity: Secondary | ICD-10-CM | POA: Diagnosis not present

## 2013-08-02 DIAGNOSIS — L8992 Pressure ulcer of unspecified site, stage 2: Secondary | ICD-10-CM | POA: Diagnosis not present

## 2013-08-02 DIAGNOSIS — L89509 Pressure ulcer of unspecified ankle, unspecified stage: Secondary | ICD-10-CM | POA: Diagnosis not present

## 2013-08-07 DIAGNOSIS — L89509 Pressure ulcer of unspecified ankle, unspecified stage: Secondary | ICD-10-CM | POA: Diagnosis not present

## 2013-08-07 DIAGNOSIS — L8992 Pressure ulcer of unspecified site, stage 2: Secondary | ICD-10-CM | POA: Diagnosis not present

## 2013-08-15 DIAGNOSIS — L8992 Pressure ulcer of unspecified site, stage 2: Secondary | ICD-10-CM | POA: Diagnosis not present

## 2013-08-15 DIAGNOSIS — L89509 Pressure ulcer of unspecified ankle, unspecified stage: Secondary | ICD-10-CM | POA: Diagnosis not present

## 2013-08-20 DIAGNOSIS — L97409 Non-pressure chronic ulcer of unspecified heel and midfoot with unspecified severity: Secondary | ICD-10-CM | POA: Diagnosis not present

## 2013-08-20 DIAGNOSIS — L97509 Non-pressure chronic ulcer of other part of unspecified foot with unspecified severity: Secondary | ICD-10-CM | POA: Diagnosis not present

## 2013-08-27 DIAGNOSIS — M810 Age-related osteoporosis without current pathological fracture: Secondary | ICD-10-CM | POA: Diagnosis not present

## 2013-08-27 DIAGNOSIS — E119 Type 2 diabetes mellitus without complications: Secondary | ICD-10-CM | POA: Diagnosis not present

## 2013-08-27 DIAGNOSIS — IMO0002 Reserved for concepts with insufficient information to code with codable children: Secondary | ICD-10-CM | POA: Diagnosis not present

## 2013-08-27 DIAGNOSIS — I10 Essential (primary) hypertension: Secondary | ICD-10-CM | POA: Diagnosis not present

## 2013-08-27 DIAGNOSIS — E538 Deficiency of other specified B group vitamins: Secondary | ICD-10-CM | POA: Diagnosis not present

## 2013-08-27 DIAGNOSIS — D649 Anemia, unspecified: Secondary | ICD-10-CM | POA: Diagnosis not present

## 2013-10-18 DIAGNOSIS — M810 Age-related osteoporosis without current pathological fracture: Secondary | ICD-10-CM | POA: Diagnosis not present

## 2013-10-18 DIAGNOSIS — IMO0002 Reserved for concepts with insufficient information to code with codable children: Secondary | ICD-10-CM | POA: Diagnosis not present

## 2013-10-19 DIAGNOSIS — H353190 Nonexudative age-related macular degeneration, unspecified eye, stage unspecified: Secondary | ICD-10-CM | POA: Diagnosis not present

## 2013-12-21 DIAGNOSIS — I10 Essential (primary) hypertension: Secondary | ICD-10-CM | POA: Diagnosis not present

## 2013-12-21 DIAGNOSIS — E538 Deficiency of other specified B group vitamins: Secondary | ICD-10-CM | POA: Diagnosis not present

## 2013-12-21 DIAGNOSIS — R7301 Impaired fasting glucose: Secondary | ICD-10-CM | POA: Diagnosis not present

## 2013-12-21 DIAGNOSIS — E559 Vitamin D deficiency, unspecified: Secondary | ICD-10-CM | POA: Diagnosis not present

## 2013-12-21 DIAGNOSIS — E785 Hyperlipidemia, unspecified: Secondary | ICD-10-CM | POA: Diagnosis not present

## 2013-12-21 DIAGNOSIS — Z0000 Encounter for general adult medical examination without abnormal findings: Secondary | ICD-10-CM | POA: Diagnosis not present

## 2013-12-24 DIAGNOSIS — D649 Anemia, unspecified: Secondary | ICD-10-CM | POA: Diagnosis not present

## 2013-12-25 DIAGNOSIS — E119 Type 2 diabetes mellitus without complications: Secondary | ICD-10-CM | POA: Diagnosis not present

## 2013-12-25 DIAGNOSIS — R609 Edema, unspecified: Secondary | ICD-10-CM | POA: Diagnosis not present

## 2013-12-25 DIAGNOSIS — B351 Tinea unguium: Secondary | ICD-10-CM | POA: Diagnosis not present

## 2013-12-25 DIAGNOSIS — R946 Abnormal results of thyroid function studies: Secondary | ICD-10-CM | POA: Diagnosis not present

## 2013-12-25 DIAGNOSIS — E538 Deficiency of other specified B group vitamins: Secondary | ICD-10-CM | POA: Diagnosis not present

## 2013-12-25 DIAGNOSIS — Z0000 Encounter for general adult medical examination without abnormal findings: Secondary | ICD-10-CM | POA: Diagnosis not present

## 2013-12-25 DIAGNOSIS — N182 Chronic kidney disease, stage 2 (mild): Secondary | ICD-10-CM | POA: Diagnosis not present

## 2013-12-25 DIAGNOSIS — J302 Other seasonal allergic rhinitis: Secondary | ICD-10-CM | POA: Diagnosis not present

## 2013-12-25 DIAGNOSIS — Z1389 Encounter for screening for other disorder: Secondary | ICD-10-CM | POA: Diagnosis not present

## 2013-12-25 DIAGNOSIS — D649 Anemia, unspecified: Secondary | ICD-10-CM | POA: Diagnosis not present

## 2013-12-26 DIAGNOSIS — Z1212 Encounter for screening for malignant neoplasm of rectum: Secondary | ICD-10-CM | POA: Diagnosis not present

## 2014-03-25 DIAGNOSIS — L97909 Non-pressure chronic ulcer of unspecified part of unspecified lower leg with unspecified severity: Secondary | ICD-10-CM | POA: Diagnosis not present

## 2014-03-25 DIAGNOSIS — S91002A Unspecified open wound, left ankle, initial encounter: Secondary | ICD-10-CM | POA: Diagnosis not present

## 2014-03-25 DIAGNOSIS — L89522 Pressure ulcer of left ankle, stage 2: Secondary | ICD-10-CM | POA: Diagnosis not present

## 2014-03-25 DIAGNOSIS — M7742 Metatarsalgia, left foot: Secondary | ICD-10-CM | POA: Diagnosis not present

## 2014-03-27 DIAGNOSIS — L97329 Non-pressure chronic ulcer of left ankle with unspecified severity: Secondary | ICD-10-CM | POA: Diagnosis not present

## 2014-03-27 DIAGNOSIS — I509 Heart failure, unspecified: Secondary | ICD-10-CM | POA: Diagnosis not present

## 2014-03-29 DIAGNOSIS — L97329 Non-pressure chronic ulcer of left ankle with unspecified severity: Secondary | ICD-10-CM | POA: Diagnosis not present

## 2014-03-29 DIAGNOSIS — I509 Heart failure, unspecified: Secondary | ICD-10-CM | POA: Diagnosis not present

## 2014-04-02 DIAGNOSIS — L97329 Non-pressure chronic ulcer of left ankle with unspecified severity: Secondary | ICD-10-CM | POA: Diagnosis not present

## 2014-04-02 DIAGNOSIS — I509 Heart failure, unspecified: Secondary | ICD-10-CM | POA: Diagnosis not present

## 2014-04-04 DIAGNOSIS — L89522 Pressure ulcer of left ankle, stage 2: Secondary | ICD-10-CM | POA: Diagnosis not present

## 2014-04-04 DIAGNOSIS — L97909 Non-pressure chronic ulcer of unspecified part of unspecified lower leg with unspecified severity: Secondary | ICD-10-CM | POA: Diagnosis not present

## 2014-04-05 DIAGNOSIS — I509 Heart failure, unspecified: Secondary | ICD-10-CM | POA: Diagnosis not present

## 2014-04-05 DIAGNOSIS — L97329 Non-pressure chronic ulcer of left ankle with unspecified severity: Secondary | ICD-10-CM | POA: Diagnosis not present

## 2014-04-08 DIAGNOSIS — L97329 Non-pressure chronic ulcer of left ankle with unspecified severity: Secondary | ICD-10-CM | POA: Diagnosis not present

## 2014-04-08 DIAGNOSIS — I509 Heart failure, unspecified: Secondary | ICD-10-CM | POA: Diagnosis not present

## 2014-04-17 DIAGNOSIS — I509 Heart failure, unspecified: Secondary | ICD-10-CM | POA: Diagnosis not present

## 2014-04-17 DIAGNOSIS — L97329 Non-pressure chronic ulcer of left ankle with unspecified severity: Secondary | ICD-10-CM | POA: Diagnosis not present

## 2014-04-18 DIAGNOSIS — R234 Changes in skin texture: Secondary | ICD-10-CM | POA: Diagnosis not present

## 2014-04-18 DIAGNOSIS — L89522 Pressure ulcer of left ankle, stage 2: Secondary | ICD-10-CM | POA: Diagnosis not present

## 2014-04-18 DIAGNOSIS — L97909 Non-pressure chronic ulcer of unspecified part of unspecified lower leg with unspecified severity: Secondary | ICD-10-CM | POA: Diagnosis not present

## 2014-04-18 DIAGNOSIS — M810 Age-related osteoporosis without current pathological fracture: Secondary | ICD-10-CM | POA: Diagnosis not present

## 2014-04-18 DIAGNOSIS — Z79899 Other long term (current) drug therapy: Secondary | ICD-10-CM | POA: Diagnosis not present

## 2014-04-18 DIAGNOSIS — E559 Vitamin D deficiency, unspecified: Secondary | ICD-10-CM | POA: Diagnosis not present

## 2014-04-18 DIAGNOSIS — Z6822 Body mass index (BMI) 22.0-22.9, adult: Secondary | ICD-10-CM | POA: Diagnosis not present

## 2014-04-22 DIAGNOSIS — H3531 Nonexudative age-related macular degeneration: Secondary | ICD-10-CM | POA: Diagnosis not present

## 2014-04-22 DIAGNOSIS — Z961 Presence of intraocular lens: Secondary | ICD-10-CM | POA: Diagnosis not present

## 2014-04-22 DIAGNOSIS — H524 Presbyopia: Secondary | ICD-10-CM | POA: Diagnosis not present

## 2014-04-24 DIAGNOSIS — I509 Heart failure, unspecified: Secondary | ICD-10-CM | POA: Diagnosis not present

## 2014-04-24 DIAGNOSIS — L97329 Non-pressure chronic ulcer of left ankle with unspecified severity: Secondary | ICD-10-CM | POA: Diagnosis not present

## 2014-04-30 DIAGNOSIS — L97329 Non-pressure chronic ulcer of left ankle with unspecified severity: Secondary | ICD-10-CM | POA: Diagnosis not present

## 2014-04-30 DIAGNOSIS — I509 Heart failure, unspecified: Secondary | ICD-10-CM | POA: Diagnosis not present

## 2014-05-02 DIAGNOSIS — L97909 Non-pressure chronic ulcer of unspecified part of unspecified lower leg with unspecified severity: Secondary | ICD-10-CM | POA: Diagnosis not present

## 2014-05-02 DIAGNOSIS — L89522 Pressure ulcer of left ankle, stage 2: Secondary | ICD-10-CM | POA: Diagnosis not present

## 2014-05-07 DIAGNOSIS — L97329 Non-pressure chronic ulcer of left ankle with unspecified severity: Secondary | ICD-10-CM | POA: Diagnosis not present

## 2014-05-07 DIAGNOSIS — I509 Heart failure, unspecified: Secondary | ICD-10-CM | POA: Diagnosis not present

## 2014-05-14 DIAGNOSIS — I509 Heart failure, unspecified: Secondary | ICD-10-CM | POA: Diagnosis not present

## 2014-05-14 DIAGNOSIS — L97329 Non-pressure chronic ulcer of left ankle with unspecified severity: Secondary | ICD-10-CM | POA: Diagnosis not present

## 2014-05-15 ENCOUNTER — Encounter (HOSPITAL_COMMUNITY): Payer: Self-pay | Admitting: General Practice

## 2014-05-15 ENCOUNTER — Inpatient Hospital Stay (HOSPITAL_COMMUNITY)
Admission: EM | Admit: 2014-05-15 | Discharge: 2014-05-20 | DRG: 243 | Disposition: A | Discharge status: Home / Self Care | Payer: Medicare Other | Attending: Internal Medicine | Admitting: Internal Medicine

## 2014-05-15 ENCOUNTER — Other Ambulatory Visit (HOSPITAL_COMMUNITY): Payer: Self-pay

## 2014-05-15 DIAGNOSIS — I483 Typical atrial flutter: Secondary | ICD-10-CM

## 2014-05-15 DIAGNOSIS — Z90710 Acquired absence of both cervix and uterus: Secondary | ICD-10-CM

## 2014-05-15 DIAGNOSIS — L97509 Non-pressure chronic ulcer of other part of unspecified foot with unspecified severity: Secondary | ICD-10-CM

## 2014-05-15 DIAGNOSIS — I4892 Unspecified atrial flutter: Secondary | ICD-10-CM | POA: Diagnosis not present

## 2014-05-15 DIAGNOSIS — I10 Essential (primary) hypertension: Secondary | ICD-10-CM | POA: Diagnosis present

## 2014-05-15 DIAGNOSIS — Z6822 Body mass index (BMI) 22.0-22.9, adult: Secondary | ICD-10-CM | POA: Diagnosis not present

## 2014-05-15 DIAGNOSIS — J939 Pneumothorax, unspecified: Secondary | ICD-10-CM

## 2014-05-15 DIAGNOSIS — R000 Tachycardia, unspecified: Secondary | ICD-10-CM | POA: Diagnosis not present

## 2014-05-15 DIAGNOSIS — I4891 Unspecified atrial fibrillation: Secondary | ICD-10-CM | POA: Diagnosis not present

## 2014-05-15 DIAGNOSIS — J95811 Postprocedural pneumothorax: Secondary | ICD-10-CM | POA: Diagnosis not present

## 2014-05-15 DIAGNOSIS — Z9689 Presence of other specified functional implants: Secondary | ICD-10-CM

## 2014-05-15 DIAGNOSIS — R001 Bradycardia, unspecified: Secondary | ICD-10-CM

## 2014-05-15 DIAGNOSIS — Z85028 Personal history of other malignant neoplasm of stomach: Secondary | ICD-10-CM

## 2014-05-15 DIAGNOSIS — Z903 Acquired absence of stomach [part of]: Secondary | ICD-10-CM | POA: Diagnosis present

## 2014-05-15 DIAGNOSIS — Z950 Presence of cardiac pacemaker: Secondary | ICD-10-CM

## 2014-05-15 DIAGNOSIS — I495 Sick sinus syndrome: Principal | ICD-10-CM | POA: Diagnosis present

## 2014-05-15 HISTORY — DX: Malignant neoplasm of stomach, unspecified: C16.9

## 2014-05-15 HISTORY — DX: Essential (primary) hypertension: I10

## 2014-05-15 LAB — CBC
HEMATOCRIT: 34.5 % — AB (ref 36.0–46.0)
HEMOGLOBIN: 11 g/dL — AB (ref 12.0–15.0)
MCH: 31.3 pg (ref 26.0–34.0)
MCHC: 31.9 g/dL (ref 30.0–36.0)
MCV: 98 fL (ref 78.0–100.0)
Platelets: 259 10*3/uL (ref 150–400)
RBC: 3.52 MIL/uL — ABNORMAL LOW (ref 3.87–5.11)
RDW: 13.8 % (ref 11.5–15.5)
WBC: 8 10*3/uL (ref 4.0–10.5)

## 2014-05-15 LAB — I-STAT CHEM 8, ED
BUN: 25 mg/dL — AB (ref 6–23)
Calcium, Ion: 1.24 mmol/L (ref 1.13–1.30)
Chloride: 103 mmol/L (ref 96–112)
Creatinine, Ser: 1 mg/dL (ref 0.50–1.10)
Glucose, Bld: 91 mg/dL (ref 70–99)
HCT: 37 % (ref 36.0–46.0)
Hemoglobin: 12.6 g/dL (ref 12.0–15.0)
Potassium: 4.7 mmol/L (ref 3.5–5.1)
Sodium: 139 mmol/L (ref 135–145)
TCO2: 22 mmol/L (ref 0–100)

## 2014-05-15 LAB — PROTIME-INR
INR: 1.15 (ref 0.00–1.49)
Prothrombin Time: 14.8 s (ref 11.6–15.2)

## 2014-05-15 MED ORDER — ONDANSETRON HCL 4 MG/2ML IJ SOLN: 4.0000 mg | Freq: Four times a day (QID) | INTRAMUSCULAR | Status: DC | PRN

## 2014-05-15 MED ORDER — APIXABAN 2.5 MG PO TABS
2.5000 mg | ORAL_TABLET | Freq: Two times a day (BID) | ORAL | Status: DC
  Administered 2014-05-15: 2.5 mg via ORAL
  Filled 2014-05-15: qty 1

## 2014-05-15 MED ORDER — SODIUM CHLORIDE 0.9 % IJ SOLN: 3.0000 mL | INTRAMUSCULAR | Status: DC | PRN

## 2014-05-15 MED ORDER — DILTIAZEM HCL 25 MG/5ML IV SOLN
10.0000 mg | Freq: Once | INTRAVENOUS | Status: AC
  Administered 2014-05-15: 10 mg via INTRAVENOUS
  Filled 2014-05-15: qty 5

## 2014-05-15 MED ORDER — SODIUM CHLORIDE 0.9 % IJ SOLN
3.0000 mL | Freq: Two times a day (BID) | INTRAMUSCULAR | Status: DC
  Administered 2014-05-15: 3 mL via INTRAVENOUS

## 2014-05-15 MED ORDER — ACETAMINOPHEN 325 MG PO TABS
650.0000 mg | ORAL_TABLET | ORAL | Status: DC | PRN
  Administered 2014-05-15: 650 mg via ORAL
  Filled 2014-05-15: qty 2

## 2014-05-15 MED ORDER — DILTIAZEM HCL 100 MG IV SOLR
5.0000 mg/h | INTRAVENOUS | Status: DC
  Administered 2014-05-15: 10 mg/h via INTRAVENOUS

## 2014-05-15 MED ORDER — DILTIAZEM HCL 100 MG IV SOLR
5.0000 mg/h | Freq: Once | INTRAVENOUS | Status: AC
  Administered 2014-05-15: 5 mg/h via INTRAVENOUS

## 2014-05-15 MED ORDER — DILTIAZEM HCL 30 MG PO TABS
30.0000 mg | ORAL_TABLET | Freq: Four times a day (QID) | ORAL | Status: DC
  Administered 2014-05-15 – 2014-05-16 (×3): 30 mg via ORAL
  Filled 2014-05-15 (×5): qty 1

## 2014-05-15 MED ORDER — SODIUM CHLORIDE 0.9 % IV SOLN: 250.0000 mL | INTRAVENOUS | Status: DC | PRN

## 2014-05-15 NOTE — ED Notes (Signed)
Meal tray ordered for patient.

## 2014-05-15 NOTE — ED Notes (Signed)
Pt sent from MD office to ED for tachycardia. Pt has a history of A-fib. Pt denies any chest pain or SOB. Pt afebrile.

## 2014-05-15 NOTE — ED Notes (Signed)
Dr. Joylene Draft called ahead.   States patient had HR 130's and had been sent to ED to check on SVT / A flutter.   Patient came POV to hospital.

## 2014-05-15 NOTE — ED Provider Notes (Signed)
CSN: [id number]     Arrival date & time 05/15/14  1024 History   First MD Initiated Contact with Patient 05/15/14 1025     No chief complaint on file.  chief complaint fast heartbeat   (Consider location/radiation/quality/duration/timing/severity/associated sxs/prior Treatment) HPI Patient sent from Dr Perini's office. Patient was checked yesterday by a home health nurse ongoing treatment for chronic foot ulcers. Nurse noted her to be tachycardic, and suggested that she go her primary care physician's office for a checkup. Dr. Joylene Draft evaluated patient performed EKG consistent with SVT/atrial flutter. Sent patient here for further evaluation and treatment. Patient is asymptomatic and denies chest pain denies lightheadedness denies shortness of breath no other symptoms. No past medical history on file. No past surgical history on file. past medical history osteoporosis abnormal LFTs hyperlipidemia type 2 diabetes hypertension chronic kidney disease anemia gastric lymphoma GI bleed foot ulcers vitamin B12 deficiency No family history on file. History  Substance Use Topics  . Smoking status: Not on file  . Smokeless tobacco: Not on file  . Alcohol Use: Not on file    no tobacco no alcohol no drugs OB History    No data available     Review of Systems  Constitutional: Negative.   HENT: Negative.   Respiratory: Negative.   Cardiovascular: Negative.   Gastrointestinal: Negative.   Musculoskeletal: Negative.   Skin: Positive for wound.       Foot ulcers  Neurological: Negative.   Psychiatric/Behavioral: Negative.   All other systems reviewed and are negative.     Allergies  Review of patient's allergies indicates not on file.  Home Medications   Prior to Admission medications   Not on File   There were no vitals taken for this visit. Physical Exam  Constitutional: She appears well-developed and well-nourished. No distress.  HENT:  Head: Normocephalic and atraumatic.  Eyes:  Conjunctivae are normal. Pupils are equal, round, and reactive to light.  Neck: Neck supple. No tracheal deviation present. No thyromegaly present.  Cardiovascular: Regular rhythm.   No murmur heard. Tachycardic  Pulmonary/Chest: Effort normal and breath sounds normal.  Abdominal: Soft. Bowel sounds are normal. She exhibits no distension. There is no tenderness.  Musculoskeletal: Normal range of motion. She exhibits no edema or tenderness.  Neurological: She is alert. Coordination normal.  Skin: Skin is warm and dry. No rash noted.  Dime-sized ulcer at left lateral malleolus. 2 mm ulcers over distal most aspects of the great toes bilaterally. No signs of infection  Psychiatric: She has a normal mood and affect.  Nursing note and vitals reviewed.   ED Course  Procedures (including critical care time) Labs Review Labs Reviewed - No data to display  Imaging Review No results found.   EKG Interpretation   Date/Time:  Wednesday May 15 2014 10:29:33 EDT Ventricular Rate:  137 PR Interval:  166 QRS Duration: 78 QT Interval:  236 QTC Calculation: 356 R Axis:   59 Text Interpretation:  Atrial fibrillation Nonspecific ST and T wave  abnormality Abnormal ECG SINCE LAST TRACING HEART RATE HAS INCREASED  Confirmed by Winfred Leeds  MD, Kathyjo Briere [phone]) on 05/15/2014 10:37:07 AM     EKG is unchanged from tracing performed 9:50 AM today at Dr. Silvestre Mesi office  ED ECG REPORT   Date: 05/15/2014  Rate: 75  Rhythm: atrial flutter  QRS Axis: normal  Intervals: normal  ST/T Wave abnormalities: nonspecific T wave changes  Conduction Disutrbances:none  Narrative Interpretation:   Old EKG Reviewed: Rate slower than  previous tracing  I have personally reviewed the EKG tracing and agree with the computerized printout as noted. 11:10 AM patient resting comfortably, remains asymptomatic after treatment with intravenous Cardizem. Rhythm has changed to atrial flutter with 4:1 block. After treatment  with intravenous Cardizem   11:40 AM I was under the patient's heart rate had increased into the 130s. Patient resting comfortably. Asymptomatic. Intravenous Cardizem drip ordered   12:50 PM heart rate remains at 136 while on Cardizem drip. Cardizem increased to 10 mg per hour. Patient remains asymptomatic. Results for orders placed or performed during the hospital encounter of 05/15/14  CBC  Result Value Ref Range   WBC 8.0 4.0 - 10.5 K/uL   RBC 3.52 (L) 3.87 - 5.11 MIL/uL   Hemoglobin 11.0 (L) 12.0 - 15.0 g/dL   HCT 34.5 (L) 36.0 - 46.0 %   MCV 98.0 78.0 - 100.0 fL   MCH 31.3 26.0 - 34.0 pg   MCHC 31.9 30.0 - 36.0 g/dL   RDW 13.8 11.5 - 15.5 %   Platelets 259 150 - 400 K/uL  I-stat chem 8, ed  Result Value Ref Range   Sodium 139 135 - 145 mmol/L   Potassium 4.7 3.5 - 5.1 mmol/L   Chloride 103 96 - 112 mmol/L   BUN 25 (H) 6 - 23 mg/dL   Creatinine, Ser 1.00 0.50 - 1.10 mg/dL   Glucose, Bld 91 70 - 99 mg/dL   Calcium, Ion 1.24 1.13 - 1.30 mmol/L   TCO2 22 0 - 100 mmol/L   Hemoglobin 12.6 12.0 - 15.0 g/dL   HCT 37.0 36.0 - 46.0 %   No results found.  MDM  spoke with cardiology service who will evaluate patient in ED . Dr.Hochrein Evaluated patient in ED and made arrangements for inpatient stay. Final diagnoses:  None   diagnosis atrial flutter       Orlie Dakin, MD 05/15/14 2334

## 2014-05-15 NOTE — H&P (Addendum)
CARDIOLOGY ADMISSION NOTE  Patient ID: [name] [name] MRN: [id number] DOB/AGE: 79-Dec-1919 79 y.o.  Admit date: 05/15/2014 Primary Physician   Jerlyn Ly, MD Primary Cardiologist   None Chief Complaint    Atrial flutter  HPI:  The patient has no past cardiac history.  She was noted by home health, who is dressing an ankle wound, to have an elevated HR.  She does not notice this.  The rate was in the 120s.  At home she lives alone and uses a walker.  She does very well.  The patient denies any new symptoms such as chest discomfort, neck or arm discomfort. There has been no new shortness of breath, PND or orthopnea. There have been no reported palpitations, presyncope or syncope.  She has had no falls.  No bleeding.  Her wound is apparently just a pressure wound because she is thin.   Past Medical History  Diagnosis Date  . Hypertension     History reviewed. No pertinent past surgical history.  No Known Allergies No current facility-administered medications on file prior to encounter.   No current outpatient prescriptions on file prior to encounter.   History   Social History  . Marital Status: Married    Spouse Name: N/A  . Number of Children: N/A  . Years of Education: N/A   Occupational History  . Not on file.   Social History Main Topics  . Smoking status: Never Smoker   . Smokeless tobacco: Not on file  . Alcohol Use: No  . Drug Use: Not on file  . Sexual Activity: Not on file   Other Topics Concern  . Not on file   Social History Narrative  . No narrative on file    No family history on file.   ROS:  As stated in the HPI and negative for all other systems.  Physical Exam: Blood pressure 107/64, pulse 52, temperature 97.5 F (36.4 C), temperature source Oral, resp. rate 17, height 5\' 4"  (1.626 m), weight 120 lb (54.432 kg), SpO2 98 %.  GENERAL:  Well appearing, dentures.  Looks younger than stated age. HEENT:  Pupils equal round and reactive, fundi not  visualized, oral mucosa unremarkable NECK:  No jugular venous distention, waveform within normal limits, carotid upstroke brisk and symmetric, no bruits, no thyromegaly LYMPHATICS:  No cervical, inguinal adenopathy LUNGS:  Clear to auscultation bilaterally BACK:  No CVA tenderness CHEST:  Unremarkable HEART:  PMI not displaced or sustained,S1 and S2 within normal limits, no W0JW clicks, no rubs, no murmurs ABD:  Flat, positive bowel sounds normal in frequency in pitch, no bruits, no rebound, no guarding, no midline pulsatile mass, no hepatomegaly, no splenomegaly EXT:  2 plus pulses throughout, no edema, no cyanosis no clubbing SKIN:  No rashes no nodules, pressure wound on left ankle NEURO:  Cranial nerves II through XII grossly intact, motor grossly intact throughout PSYCH:  Cognitively intact, oriented to person place and time  Labs: Lab Results  Component Value Date   BUN 25* 05/15/2014   Lab Results  Component Value Date   CREATININE 1.00 05/15/2014   Lab Results  Component Value Date   NA 139 05/15/2014   K 4.7 05/15/2014   CL 103 05/15/2014   No results found for: TROPONINI Lab Results  Component Value Date   WBC 8.0 05/15/2014   HGB 12.6 05/15/2014   HCT 37.0 05/15/2014   MCV 98.0 05/15/2014   PLT 259 05/15/2014    EKG:  typical  atrial flutter.  Ventricular rate 70, no acute ST T wave changes.  05/15/2014\  ASSESSMENT AND PLAN:    ATRIAL FLUTTER:  Convert to PO Cardizem.  No contraindication to NOAC.  Start Eliquis at 2.5 bid.  Check echo.  I will see her back in [place] for follow up.  Possible DCCV in the AM.  I discussed at length the risk benefits of anticoagulation with the patient and her son.  [name] [name] has a CHA2DS2 - VASc score of 4 with a risk of stroke of 4%    HTN:  The blood pressure is at target. No change in medications is indicated. No change in therapy is indicated.  SignedMinus Breeding 05/15/2014, 2:06 PM

## 2014-05-16 ENCOUNTER — Ambulatory Visit (HOSPITAL_COMMUNITY): Admission: RE | Admit: 2014-05-16 | Payer: Medicare Other | Source: Ambulatory Visit

## 2014-05-16 ENCOUNTER — Encounter (HOSPITAL_COMMUNITY)
Admission: EM | Disposition: A | Discharge status: Home / Self Care | Payer: Medicare Other | Source: Home / Self Care | Attending: Internal Medicine

## 2014-05-16 ENCOUNTER — Other Ambulatory Visit (HOSPITAL_COMMUNITY): Payer: Self-pay | Admitting: Internal Medicine

## 2014-05-16 ENCOUNTER — Observation Stay (HOSPITAL_COMMUNITY): Payer: Medicare Other

## 2014-05-16 DIAGNOSIS — I4892 Unspecified atrial flutter: Secondary | ICD-10-CM | POA: Diagnosis not present

## 2014-05-16 DIAGNOSIS — Z90710 Acquired absence of both cervix and uterus: Secondary | ICD-10-CM | POA: Diagnosis not present

## 2014-05-16 DIAGNOSIS — J9811 Atelectasis: Secondary | ICD-10-CM | POA: Diagnosis not present

## 2014-05-16 DIAGNOSIS — J181 Lobar pneumonia, unspecified organism: Secondary | ICD-10-CM | POA: Diagnosis not present

## 2014-05-16 DIAGNOSIS — I10 Essential (primary) hypertension: Secondary | ICD-10-CM

## 2014-05-16 DIAGNOSIS — J90 Pleural effusion, not elsewhere classified: Secondary | ICD-10-CM | POA: Diagnosis not present

## 2014-05-16 DIAGNOSIS — Z4682 Encounter for fitting and adjustment of non-vascular catheter: Secondary | ICD-10-CM | POA: Diagnosis not present

## 2014-05-16 DIAGNOSIS — Z789 Other specified health status: Secondary | ICD-10-CM | POA: Diagnosis not present

## 2014-05-16 DIAGNOSIS — I495 Sick sinus syndrome: Secondary | ICD-10-CM

## 2014-05-16 DIAGNOSIS — Z903 Acquired absence of stomach [part of]: Secondary | ICD-10-CM | POA: Diagnosis present

## 2014-05-16 DIAGNOSIS — J939 Pneumothorax, unspecified: Secondary | ICD-10-CM | POA: Diagnosis not present

## 2014-05-16 DIAGNOSIS — I4891 Unspecified atrial fibrillation: Secondary | ICD-10-CM | POA: Diagnosis not present

## 2014-05-16 DIAGNOSIS — J95811 Postprocedural pneumothorax: Secondary | ICD-10-CM | POA: Diagnosis not present

## 2014-05-16 DIAGNOSIS — R001 Bradycardia, unspecified: Secondary | ICD-10-CM

## 2014-05-16 DIAGNOSIS — L97509 Non-pressure chronic ulcer of other part of unspecified foot with unspecified severity: Secondary | ICD-10-CM

## 2014-05-16 DIAGNOSIS — Z950 Presence of cardiac pacemaker: Secondary | ICD-10-CM | POA: Diagnosis not present

## 2014-05-16 DIAGNOSIS — Z85028 Personal history of other malignant neoplasm of stomach: Secondary | ICD-10-CM | POA: Diagnosis not present

## 2014-05-16 DIAGNOSIS — I480 Paroxysmal atrial fibrillation: Secondary | ICD-10-CM

## 2014-05-16 HISTORY — PX: PERMANENT PACEMAKER INSERTION: SHX5480

## 2014-05-16 LAB — COMPREHENSIVE METABOLIC PANEL
ALBUMIN: 3.2 g/dL — AB (ref 3.5–5.2)
ALT: 23 U/L (ref 0–35)
ANION GAP: 10 (ref 5–15)
AST: 26 U/L (ref 0–37)
Alkaline Phosphatase: 115 U/L (ref 39–117)
BILIRUBIN TOTAL: 0.5 mg/dL (ref 0.3–1.2)
BUN: 22 mg/dL (ref 6–23)
CO2: 26 mmol/L (ref 19–32)
CREATININE: 1.08 mg/dL (ref 0.50–1.10)
Calcium: 9.5 mg/dL (ref 8.4–10.5)
Chloride: 105 mmol/L (ref 96–112)
GFR, EST AFRICAN AMERICAN: 49 mL/min — AB (ref >90)
GFR, EST NON AFRICAN AMERICAN: 43 mL/min — AB (ref >90)
GLUCOSE: 111 mg/dL — AB (ref 70–99)
Potassium: 4.2 mmol/L (ref 3.5–5.1)
Sodium: 141 mmol/L (ref 135–145)
Total Protein: 6.4 g/dL (ref 6.0–8.3)

## 2014-05-16 LAB — CBC
HCT: 31.2 % — ABNORMAL LOW (ref 36.0–46.0)
HEMOGLOBIN: 9.9 g/dL — AB (ref 12.0–15.0)
MCH: 30.9 pg (ref 26.0–34.0)
MCHC: 31.7 g/dL (ref 30.0–36.0)
MCV: 97.5 fL (ref 78.0–100.0)
PLATELETS: 251 10*3/uL (ref 150–400)
RBC: 3.2 MIL/uL — AB (ref 3.87–5.11)
RDW: 13.7 % (ref 11.5–15.5)
WBC: 7.6 10*3/uL (ref 4.0–10.5)

## 2014-05-16 LAB — TSH: TSH: 5.969 u[IU]/mL — ABNORMAL HIGH (ref 0.350–4.500)

## 2014-05-16 SURGERY — PERMANENT PACEMAKER INSERTION

## 2014-05-16 MED ORDER — ONDANSETRON HCL 4 MG/2ML IJ SOLN
4.0000 mg | Freq: Four times a day (QID) | INTRAMUSCULAR | Status: DC | PRN
  Administered 2014-05-17: 4 mg via INTRAVENOUS
  Filled 2014-05-16: qty 2

## 2014-05-16 MED ORDER — CEFAZOLIN SODIUM-DEXTROSE 2-3 GM-% IV SOLR
INTRAVENOUS | Status: AC
  Filled 2014-05-16: qty 50

## 2014-05-16 MED ORDER — LIDOCAINE HCL (PF) 1 % IJ SOLN
INTRAMUSCULAR | Status: AC
  Filled 2014-05-16: qty 30

## 2014-05-16 MED ORDER — POTASSIUM CHLORIDE CRYS ER 10 MEQ PO TBCR
10.0000 meq | EXTENDED_RELEASE_TABLET | Freq: Every day | ORAL | Status: DC
  Administered 2014-05-17 – 2014-05-20 (×4): 10 meq via ORAL
  Filled 2014-05-16 (×4): qty 1

## 2014-05-16 MED ORDER — CEFAZOLIN SODIUM-DEXTROSE 2-3 GM-% IV SOLR
2.0000 g | INTRAVENOUS | Status: DC
Start: 2014-05-16 — End: 2014-05-16
  Filled 2014-05-16: qty 50

## 2014-05-16 MED ORDER — TRAMADOL HCL 50 MG PO TABS
50.0000 mg | ORAL_TABLET | Freq: Four times a day (QID) | ORAL | Status: DC | PRN
  Administered 2014-05-16 (×2): 50 mg via ORAL
  Filled 2014-05-16 (×2): qty 1

## 2014-05-16 MED ORDER — SIMVASTATIN 40 MG PO TABS
40.0000 mg | ORAL_TABLET | Freq: Every day | ORAL | Status: DC
  Administered 2014-05-16 – 2014-05-17 (×2): 40 mg via ORAL
  Filled 2014-05-16 (×2): qty 1

## 2014-05-16 MED ORDER — TRAMADOL HCL 50 MG PO TABS
50.0000 mg | ORAL_TABLET | Freq: Three times a day (TID) | ORAL | Status: DC | PRN
  Administered 2014-05-16: 50 mg via ORAL
  Filled 2014-05-16 (×2): qty 1

## 2014-05-16 MED ORDER — CHLORHEXIDINE GLUCONATE 4 % EX LIQD
60.0000 mL | Freq: Once | CUTANEOUS | Status: DC
  Filled 2014-05-16: qty 30

## 2014-05-16 MED ORDER — SUCRALFATE 1 G PO TABS
1.0000 g | ORAL_TABLET | Freq: Three times a day (TID) | ORAL | Status: DC
  Administered 2014-05-16 – 2014-05-20 (×15): 1 g via ORAL
  Filled 2014-05-16 (×15): qty 1

## 2014-05-16 MED ORDER — HYDROCHLOROTHIAZIDE 25 MG PO TABS
12.5000 mg | ORAL_TABLET | Freq: Every day | ORAL | Status: DC
  Administered 2014-05-16 – 2014-05-20 (×5): 12.5 mg via ORAL
  Filled 2014-05-16 (×5): qty 1

## 2014-05-16 MED ORDER — FENTANYL CITRATE (PF) 100 MCG/2ML IJ SOLN
INTRAMUSCULAR | Status: AC
  Filled 2014-05-16: qty 2

## 2014-05-16 MED ORDER — OFF THE BEAT BOOK
Freq: Once | Status: DC
  Filled 2014-05-16: qty 1

## 2014-05-16 MED ORDER — MIDAZOLAM HCL 5 MG/5ML IJ SOLN
INTRAMUSCULAR | Status: AC
  Filled 2014-05-16: qty 5

## 2014-05-16 MED ORDER — TRAMADOL HCL 50 MG PO TABS
50.0000 mg | ORAL_TABLET | Freq: Two times a day (BID) | ORAL | Status: DC | PRN
  Administered 2014-05-16: 50 mg via ORAL
  Administered 2014-05-17: 100 mg via ORAL
  Administered 2014-05-18 – 2014-05-19 (×3): 50 mg via ORAL
  Filled 2014-05-16: qty 2
  Filled 2014-05-16 (×3): qty 1

## 2014-05-16 MED ORDER — SODIUM CHLORIDE 0.9 % IV SOLN
INTRAVENOUS | Status: DC
  Administered 2014-05-16: 10:00:00 via INTRAVENOUS

## 2014-05-16 MED ORDER — SODIUM CHLORIDE 0.9 % IR SOLN
80.0000 mg | Status: AC
  Administered 2014-05-16: 80 mg
  Filled 2014-05-16 (×2): qty 2

## 2014-05-16 MED ORDER — CEFAZOLIN SODIUM 1-5 GM-% IV SOLN
1.0000 g | Freq: Four times a day (QID) | INTRAVENOUS | Status: AC
  Administered 2014-05-16 – 2014-05-17 (×3): 1 g via INTRAVENOUS
  Filled 2014-05-16 (×3): qty 50

## 2014-05-16 MED ORDER — LORAZEPAM 1 MG PO TABS
1.0000 mg | ORAL_TABLET | Freq: Every day | ORAL | Status: DC
  Administered 2014-05-16 – 2014-05-19 (×4): 1 mg via ORAL
  Filled 2014-05-16 (×4): qty 1

## 2014-05-16 MED ORDER — ACETAMINOPHEN 325 MG PO TABS: 325.0000 mg | ORAL_TABLET | ORAL | Status: DC | PRN

## 2014-05-16 MED ORDER — METOPROLOL TARTRATE 50 MG PO TABS
50.0000 mg | ORAL_TABLET | Freq: Two times a day (BID) | ORAL | Status: DC
  Administered 2014-05-16 – 2014-05-20 (×8): 50 mg via ORAL
  Filled 2014-05-16 (×8): qty 1

## 2014-05-16 MED ORDER — CHLORHEXIDINE GLUCONATE 4 % EX LIQD
60.0000 mL | Freq: Once | CUTANEOUS | Status: DC
  Filled 2014-05-16: qty 15

## 2014-05-16 NOTE — Interval H&P Note (Signed)
History and Physical Interval Note:  05/16/2014 3:06 PM  [name] [name]  has presented today for surgery, with the diagnosis of bradicardia  The various methods of treatment have been discussed with the patient and family. After consideration of risks, benefits and other options for treatment, the patient has consented to  Procedure(s): PERMANENT PACEMAKER INSERTION (N/A) as a surgical intervention .  The patient's history has been reviewed, patient examined, no change in status, stable for surgery.  I have reviewed the patient's chart and labs.  Questions were answered to the patient's satisfaction.     Mikle Bosworth.D.

## 2014-05-16 NOTE — CV Procedure (Signed)
SURGEON:  Cristopher Peru, MD     PREPROCEDURE DIAGNOSIS:  Symptomatic Bradycardia due to sinus node dysfunction    POSTPROCEDURE DIAGNOSIS:  Same as preprocedure diagnosis     PROCEDURES:   1. Left upper extremity venography.   2. Pacemaker implantation.     INTRODUCTION: [name] [name] is a 81 y.o. female  with a history of bradycardia with symptomatic 7 second pauses who presents today for pacemaker implantation.  The patient reports palpitations and she has been found to have atrial flutter with an RVR.  No reversible causes have been identified.  The patient therefore presents today for pacemaker implantation.     DESCRIPTION OF PROCEDURE:  Informed written consent was obtained, and   the patient was brought to the electrophysiology lab in a fasting state.  The patient required no sedation for the procedure today.  The patients left chest was prepped and draped in the usual sterile fashion by the EP lab staff. The skin overlying the left deltopectoral region was infiltrated with lidocaine for local analgesia.  A 4-cm incision was made over the left deltopectoral region.  A left subcutaneous pacemaker pocket was fashioned using a combination of sharp and blunt dissection. Electrocautery was required to assure hemostasis.    Left Upper Extremity Venography: A venogram of the left upper extremity was performed, after initial attempts to puncture the axillary vein were unsuccessful which revealed a small left cephalic vein, which emptied into a small left subclavian vein, caudally displaced.  The left axillary vein was small in size.    RA/RV Lead Placement: The left axillary vein was therefore directly visualized and cannulated.  Through the left axillary vein, a St. Jude (serial number [id number]) right atrial lead and a St. Jude (serial number [id number]) right ventricular lead were advanced with fluoroscopic visualization into the right atrial appendage and right ventricular apical septal  positions respectively.  Initial atrial lead P- waves measured 2 mV with impedance of 455 ohms and a threshold of 0.5 V at 0.5 msec.  Right ventricular lead R-waves measured 17 mV with an impedance of 708 ohms and a threshold of 0.5 V at 0.5 msec.  Both leads were secured to the pectoralis fascia using #2-0 silk over the suture sleeves.   Device Placement:  The leads were then connected to a St. Jude (serial number [id number]) pacemaker.  The pocket was irrigated with copious gentamicin solution.  The pacemaker was then placed into the pocket.  The pocket was then closed in 2 layers with 2.0 Vicryl suture for the subcutaneous and subcuticular layers.  Steri-Strips and a sterile dressing were then applied.  There were no early apparent complications.     CONCLUSIONS:   1. Successful implantation of a St.Jude dual-chamber pacemaker for symptomatic bradycardia due to sinus node dysfunction  2. No early apparent complications.           Cristopher Peru, MD 05/16/2014 6:03 PM

## 2014-05-16 NOTE — Care Management Note (Signed)
    Page 1 of 1   05/20/2014     1:13:23 PM CARE MANAGEMENT NOTE 05/20/2014  Patient:  [name] [name], [name] [name]   Account Number:  [id number]  Date Initiated:  05/16/2014  Documentation initiated by:  Elenor Quinones  Subjective/Objective Assessment:   Pt admittted with Chest Pains.  EKG showed A flutter with increased HR     Action/Plan:   Patient is from home.  CM will assist with Eliquis medication as ordered.   Anticipated DC Date:  05/18/2014   Anticipated DC Plan:  HOME/SELF CARE      DC Planning Services  CM consult  Medication Assistance      Choice offered to / List presented to:             Status of service:  Completed, signed off Medicare Important Message given?  YES (If response is "NO", the following Medicare IM given date fields will be blank) Date Medicare IM given:  05/17/2014 Medicare IM given by:  Elenor Quinones Date Additional Medicare IM given:  05/20/2014 Additional Medicare IM given by:  Elenor Quinones  Discharge Disposition:  HOME/SELF CARE  Per UR Regulation:  Reviewed for med. necessity/level of care/duration of stay  If discussed at Long Length of Stay Meetings, dates discussed:    Comments:  MD please write 30 presciption for Eliquis with refills. Thanks!   05/16/14  Elenor Quinones, RN, BSN, [phone] PT COPAY WILL BE $73.23 - PRIOR AUTH NOT REQUIRED.  CM communicated with pt copay and provided Eliquis 30 day free card and informed of customer service number located on pack.  CM assessed home situtaion with pt, per pt; she already has a walker at home and wears a life alert, daughter at bedside and confirmed that at this time pt doesnt need any other equipment or services post discharge.  CM will continue to monitor pt needs for disposition.  No other CM needs at this time   05/16/14 Elenor Quinones, RN, BSN [phone].  CM spoke with pt regarding pharmacy preference.  Pt stated that [facility] uses [facility] [place].  [facility] and was  informed that both 2.5mg  and 5 mg dose (BID) is available for pick up.  CM has requested benifit check and will provide information to pt once recieved.

## 2014-05-16 NOTE — Progress Notes (Signed)
UR completed 

## 2014-05-16 NOTE — H&P (View-Only) (Signed)
ELECTROPHYSIOLOGY CONSULT NOTE    Patient ID: [name] [name] MRN: [id number], DOB/AGE: 79-Dec-1919 79 y.o.  Admit date: 05/15/2014 Date of Consult: 05/16/2014  Primary Physician: Jerlyn Ly, MD Primary Cardiologist: Percival Spanish (new this admission)  Reason for Consultation: tachy/brady syndrome  HPI:  [name] [name] is a 81 y.o. female with a past medical history only for hypertension and prior stomach cancer.  She remains very functional and active and lives at home alone.  She was seen at her PCP's office yesterday and found to have AF with RVR with ventricular rates in the 130's.  She was evaluated in the ER and started on Diltiazem drip.  She then converted to SR with a 7 second pause.  She had junctional rhythm afterwards with rates in the 30's.  She has since maintained SR with rates in the 50-60's.  EP has been asked to evaluate for treatment options.   Past Medical History  Diagnosis Date  . Hypertension   . Stomach cancer     1992     Surgical History:  Past Surgical History  Procedure Laterality Date  . Partial gastrectomy  1992  . Cholecystectomy    . Back surgery    . Total abdominal hysterectomy       No prescriptions prior to admission    Inpatient Medications:  . sodium chloride  3 mL Intravenous Q12H    Allergies: No Known Allergies  History   Social History  . Marital Status: Married    Spouse Name: N/A  . Number of Children: 4  . Years of Education: N/A   Occupational History  . Not on file.   Social History Main Topics  . Smoking status: Never Smoker   . Smokeless tobacco: Not on file  . Alcohol Use: No  . Drug Use: Not on file  . Sexual Activity: Not on file   Other Topics Concern  . Not on file   Social History Narrative   Lives alone.       Family History  Problem Relation Age of Onset  . Heart disease Father     Dropsy  . Pneumonia Mother   . Sudden death Daughter 72  . Heart attack Son 27     Review of  Systems: General: No chills, fever, night sweats or weight changes  Cardiovascular:  No chest pain, dyspnea on exertion, edema, orthopnea, palpitations, paroxysmal nocturnal dyspnea Dermatological: No rash, lesions or masses Respiratory: No cough, dyspnea Urologic: No hematuria, dysuria Abdominal: No nausea, vomiting, diarrhea, bright red blood per rectum, melena, or hematemesis Neurologic: No visual changes, weakness, changes in mental status All other systems reviewed and are otherwise negative except as noted above.  Physical Exam: Filed Vitals:   05/15/14 1815 05/15/14 1902 05/15/14 2242 05/16/14 0546  BP: 136/66 139/80 141/51 172/58  Pulse: 68  72 77  Temp:  99 F (37.2 C) 98 F (36.7 C) 98.5 F (36.9 C)  TempSrc:  Oral Oral Oral  Resp: 19 18 18 18   Height:  5\' 4"  (1.626 m)    Weight:  127 lb (57.607 kg)  127 lb (57.607 kg)  SpO2: 99% 100% 97% 97%    GEN- The patient is well appearing, alert and oriented x 3 today.   HEENT: normocephalic, atraumatic; sclera clear, conjunctiva pink; hearing intact; oropharynx clear; neck supple, no JVP Lymph- no cervical lymphadenopathy Lungs- Clear to ausculation bilaterally, normal work of breathing.  No wheezes, rales, rhonchi Heart- Regular rate and rhythm, no murmurs,  rubs or gallops  GI- soft, non-tender, non-distended, bowel sounds present  Extremities- no clubbing, cyanosis, or edema; DP/PT/radial pulses 2+ bilaterally MS- no significant deformity or atrophy Skin- warm and dry, no rash or lesion Psych- euthymic mood, full affect Neuro- strength and sensation are intact  Labs:   Lab Results  Component Value Date   WBC 7.6 05/16/2014   HGB 9.9* 05/16/2014   HCT 31.2* 05/16/2014   MCV 97.5 05/16/2014   PLT 251 05/16/2014    Recent Labs Lab 05/16/14 0607  NA 141  K 4.2  CL 105  CO2 26  BUN 22  CREATININE 1.08  CALCIUM 9.5  PROT 6.4  BILITOT 0.5  ALKPHOS 115  ALT 23  AST 26  GLUCOSE 111*       Radiology/Studies: No results found.  EKG: typical atrial flutter, ventricular rate 137  TELEMETRY: atrial flutter with conversion to SR with 7 second pause followed by junctional rhythm in the 30's then SR  Assessment/Plan: 1.  Atrial flutter with 7 second post termination pause - tachy/brady syndrome The patient has newly diagnosed atrial flutter with post termination pauses She has been appropriately started on Eliquis for CHADS2VASC score of 4 Flutter ablation would be an option, but with advanced age, may be more prudent to implant pacemaker for post termination pauses and treat medically.  Will discuss with Dr Lovena Le.  She has been placed on add-on board for pacemaker implant later today.   2.  HTN Stable No change required today  Signed, Chanetta Marshall, NP 05/16/2014 7:28 AM  EP Attending  Patient seen and examined. Agree with the history, exam, data, assessment and plan as noted by Chanetta Marshall, NP. The patient has symptomatic sinus node dysfunction and atrial flutter with an RVR. I have recommended she undergo PPM insertion (least invasive option at her advanced age). The risks/benefits/goals/expectations of the procedure have been discussed with the patient and she whishes to proceed.   Mikle Bosworth.D.

## 2014-05-16 NOTE — Progress Notes (Signed)
  Patient: [name] [name] / Admit Date: 05/15/2014 / Date of Encounter: 05/16/2014, 7:00 AM   Subjective: Feeling fine. No chest pain or SOB. Did not have any dizziness or syncope yesterday with 6.7 second pause around 3:30pm. No one was notified of this. We will be discussing with the ER to find out what happened.   Objective: Telemetry: 6.7sec post-conversion pause yesterday with subsequent HR 30s-40s, then remainder of time was NSR HR 50s-70s Physical Exam: Blood pressure 172/58, pulse 77, temperature 98.5 F (36.9 C), temperature source Oral, resp. rate 18, height 5\' 4"  (1.626 m), weight 127 lb (57.607 kg), SpO2 97 %. General: Well developed, well nourished WF, in no acute distress. Head: Normocephalic, atraumatic, sclera non-icteric, no xanthomas, nares are without discharge. Neck: Negative for carotid bruits. JVP not elevated. Lungs: Clear bilaterally to auscultation without wheezes, rales, or rhonchi. Breathing is unlabored. Heart: RRR S1 S2 without murmurs, rubs, or gallops.  Abdomen: Soft, non-tender, non-distended with normoactive bowel sounds. No rebound/guarding. Extremities: No clubbing or cyanosis. No edema. Distal pedal pulses are 2+ and equal bilaterally. Neuro: Alert and oriented X 3. Moves all extremities spontaneously. Psych:  Responds to questions appropriately with a normal affect.  No intake or output data in the 24 hours ending 05/16/14 0700  Inpatient Medications:  . apixaban  2.5 mg Oral BID  . sodium chloride  3 mL Intravenous Q12H   Infusions:    Labs:  Recent Labs  05/15/14 1111  NA 139  K 4.7  CL 103  GLUCOSE 91  BUN 25*  CREATININE 1.00    Radiology/Studies:  No results found.   Assessment and Plan  1. Newly recognized atrial fibrillation with RVR, with suspected tachybrady syndrome 2. Foot ulcer, followed by podiatry/PCP/home health 3. H/o essential HTN  The patient is still a quite functional 81 y/o F who ambulates with a walker at home  but lives alone. Family assists her with ADLs but she and her daughter still describe her as quite active. Her mental status is completely in tact. I have asked EP to see her this morning to consider pacemaker implantation, which the patient is amenable to. She will require AVN blocking agents for her AF, however, yesterday demonstrated a 6.7 post-conversion pause and subsequent transient HRs in the 30-40s. She has remained in NSR HR 50s-70s since that time. I discontinued any further dilitazem for now and have also held apixaban which was newly started last night in case procedure will occur.  Labwork still pending at this time. Will also obtain baseline CXR. 2D echo pending.  Signed, [name] PA-C Pager: [phone]   Personally seen and examined. Agree with above. Lungs clear, smiling. Very functional 81 year old. Walking back from bathroom.  Agree with EP - stop dilt. Holding Eliquis. Pacemaker seems prudent. Await final decision by Dr. Lovena Le and EP team.   Candee Furbish, MD

## 2014-05-16 NOTE — Consult Note (Signed)
ELECTROPHYSIOLOGY CONSULT NOTE    Patient ID: [name] [name] MRN: [id number], DOB/AGE: 79-24-1919 36 y.o.  Admit date: 05/15/2014 Date of Consult: 05/16/2014  Primary Physician: Jerlyn Ly, MD Primary Cardiologist: Percival Spanish (new this admission)  Reason for Consultation: tachy/brady syndrome  HPI:  [name] [name] is a 81 y.o. female with a past medical history only for hypertension and prior stomach cancer.  She remains very functional and active and lives at home alone.  She was seen at her PCP's office yesterday and found to have AF with RVR with ventricular rates in the 130's.  She was evaluated in the ER and started on Diltiazem drip.  She then converted to SR with a 7 second pause.  She had junctional rhythm afterwards with rates in the 30's.  She has since maintained SR with rates in the 50-60's.  EP has been asked to evaluate for treatment options.   Past Medical History  Diagnosis Date  . Hypertension   . Stomach cancer     1992     Surgical History:  Past Surgical History  Procedure Laterality Date  . Partial gastrectomy  1992  . Cholecystectomy    . Back surgery    . Total abdominal hysterectomy       No prescriptions prior to admission    Inpatient Medications:  . sodium chloride  3 mL Intravenous Q12H    Allergies: No Known Allergies  History   Social History  . Marital Status: Married    Spouse Name: N/A  . Number of Children: 4  . Years of Education: N/A   Occupational History  . Not on file.   Social History Main Topics  . Smoking status: Never Smoker   . Smokeless tobacco: Not on file  . Alcohol Use: No  . Drug Use: Not on file  . Sexual Activity: Not on file   Other Topics Concern  . Not on file   Social History Narrative   Lives alone.       Family History  Problem Relation Age of Onset  . Heart disease Father     Dropsy  . Pneumonia Mother   . Sudden death Daughter 19  . Heart attack Son 75     Review of  Systems: General: No chills, fever, night sweats or weight changes  Cardiovascular:  No chest pain, dyspnea on exertion, edema, orthopnea, palpitations, paroxysmal nocturnal dyspnea Dermatological: No rash, lesions or masses Respiratory: No cough, dyspnea Urologic: No hematuria, dysuria Abdominal: No nausea, vomiting, diarrhea, bright red blood per rectum, melena, or hematemesis Neurologic: No visual changes, weakness, changes in mental status All other systems reviewed and are otherwise negative except as noted above.  Physical Exam: Filed Vitals:   05/15/14 1815 05/15/14 1902 05/15/14 2242 05/16/14 0546  BP: 136/66 139/80 141/51 172/58  Pulse: 68  72 77  Temp:  99 F (37.2 C) 98 F (36.7 C) 98.5 F (36.9 C)  TempSrc:  Oral Oral Oral  Resp: 19 18 18 18   Height:  5\' 4"  (1.626 m)    Weight:  127 lb (57.607 kg)  127 lb (57.607 kg)  SpO2: 99% 100% 97% 97%    GEN- The patient is well appearing, alert and oriented x 3 today.   HEENT: normocephalic, atraumatic; sclera clear, conjunctiva pink; hearing intact; oropharynx clear; neck supple, no JVP Lymph- no cervical lymphadenopathy Lungs- Clear to ausculation bilaterally, normal work of breathing.  No wheezes, rales, rhonchi Heart- Regular rate and rhythm, no murmurs,  rubs or gallops  GI- soft, non-tender, non-distended, bowel sounds present  Extremities- no clubbing, cyanosis, or edema; DP/PT/radial pulses 2+ bilaterally MS- no significant deformity or atrophy Skin- warm and dry, no rash or lesion Psych- euthymic mood, full affect Neuro- strength and sensation are intact  Labs:   Lab Results  Component Value Date   WBC 7.6 05/16/2014   HGB 9.9* 05/16/2014   HCT 31.2* 05/16/2014   MCV 97.5 05/16/2014   PLT 251 05/16/2014    Recent Labs Lab 05/16/14 0607  NA 141  K 4.2  CL 105  CO2 26  BUN 22  CREATININE 1.08  CALCIUM 9.5  PROT 6.4  BILITOT 0.5  ALKPHOS 115  ALT 23  AST 26  GLUCOSE 111*       Radiology/Studies: No results found.  EKG: typical atrial flutter, ventricular rate 137  TELEMETRY: atrial flutter with conversion to SR with 7 second pause followed by junctional rhythm in the 30's then SR  Assessment/Plan: 1.  Atrial flutter with 7 second post termination pause - tachy/brady syndrome The patient has newly diagnosed atrial flutter with post termination pauses She has been appropriately started on Eliquis for CHADS2VASC score of 4 Flutter ablation would be an option, but with advanced age, may be more prudent to implant pacemaker for post termination pauses and treat medically.  Will discuss with Dr Lovena Le.  She has been placed on add-on board for pacemaker implant later today.   2.  HTN Stable No change required today  Signed, Chanetta Marshall, NP 05/16/2014 7:28 AM  EP Attending  Patient seen and examined. Agree with the history, exam, data, assessment and plan as noted by Chanetta Marshall, NP. The patient has symptomatic sinus node dysfunction and atrial flutter with an RVR. I have recommended she undergo PPM insertion (least invasive option at her advanced age). The risks/benefits/goals/expectations of the procedure have been discussed with the patient and she whishes to proceed.   Mikle Bosworth.D.

## 2014-05-16 NOTE — Progress Notes (Signed)
  Echocardiogram 2D Echocardiogram has been performed.  [name] [name] 05/16/2014, 1:46 PM

## 2014-05-17 ENCOUNTER — Inpatient Hospital Stay (HOSPITAL_COMMUNITY): Payer: Medicare Other

## 2014-05-17 ENCOUNTER — Encounter (HOSPITAL_COMMUNITY): Payer: Self-pay | Admitting: Internal Medicine

## 2014-05-17 DIAGNOSIS — Z950 Presence of cardiac pacemaker: Secondary | ICD-10-CM

## 2014-05-17 DIAGNOSIS — J939 Pneumothorax, unspecified: Secondary | ICD-10-CM

## 2014-05-17 MED ORDER — MIDAZOLAM HCL 2 MG/2ML IJ SOLN
INTRAMUSCULAR | Status: AC
  Administered 2014-05-17: 1 mg
  Filled 2014-05-17: qty 4

## 2014-05-17 MED ORDER — FENTANYL CITRATE (PF) 100 MCG/2ML IJ SOLN: 100.0000 ug | Freq: Once | INTRAMUSCULAR | Status: DC

## 2014-05-17 MED ORDER — DILTIAZEM HCL 30 MG PO TABS
30.0000 mg | ORAL_TABLET | Freq: Four times a day (QID) | ORAL | Status: DC
  Administered 2014-05-17 – 2014-05-20 (×12): 30 mg via ORAL
  Filled 2014-05-17 (×12): qty 1

## 2014-05-17 MED ORDER — FENTANYL CITRATE (PF) 100 MCG/2ML IJ SOLN
25.0000 ug | INTRAMUSCULAR | Status: DC | PRN
  Administered 2014-05-17 – 2014-05-19 (×6): 50 ug via INTRAVENOUS
  Filled 2014-05-17 (×6): qty 2

## 2014-05-17 MED ORDER — MIDAZOLAM HCL 2 MG/2ML IJ SOLN: 1.0000 mg | Freq: Once | INTRAMUSCULAR | Status: DC

## 2014-05-17 MED ORDER — FENTANYL CITRATE (PF) 100 MCG/2ML IJ SOLN
INTRAMUSCULAR | Status: AC
  Administered 2014-05-17: 100 ug
  Filled 2014-05-17: qty 2

## 2014-05-17 NOTE — Procedures (Signed)
Chest Tube Insertion Procedure Note  Indications:  Pneumothorax  Pre-operative Diagnosis: Pneumothorax  Post-operative Diagnosis: Pneumothorax  Procedure Details  Informed consent was obtained for the procedure, including sedation.  Risks of lung perforation, hemorrhage, arrhythmia, and adverse drug reaction were discussed.   After sterile skin prep, using standard technique, a 14 French tube was placed in the left anterior 5th rib space.  Findings: None  Estimated Blood Loss:  Minimal         Specimens:  None              Complications:  None; patient tolerated the procedure well.         Disposition: Telemetry bed and stable.         Condition: stable   Montey Hora, [place] [facility] Pager: [phone]  or [phone] 05/17/2014, 12:52 PM    Attending Attestation: I was present for the entire procedure.

## 2014-05-17 NOTE — Progress Notes (Signed)
Chest xray Results reported from radiologist. Montey Hora, PA notified of chest xray results. Chanetta Marshall, NP notified of chest xray results.  Will continue to monitor.

## 2014-05-17 NOTE — Consult Note (Signed)
Name: [name] [name] MRN: [id number] DOB: 1917-08-17    ADMISSION DATE:  05/15/2014 CONSULTATION DATE:  05/17/2014  REFERRING MD :  Lovena Le  CHIEF COMPLAINT:  PTX following placement of PPM  BRIEF PATIENT DESCRIPTION: 81 y.o. F who had PPM placed 4/21 for symptomatic bradycardia due to sinus node dysfunction.  On CXR following morning, found to have moderate left PTX.  PCCM consulted for recs.  SIGNIFICANT EVENTS  4/20 - seen in PCP office, found to have AF with RVR.   Started on dilt gtt in ED, converted to SR then sinus brady 4/21 - PPM placed (Dr. Lovena Le) 4/22 - PCCM consulted for moderate left PTX following PPM placement  STUDIES:  CXR 4/22 >>> moderate left PTX post pacemaker placement.   HISTORY OF PRESENT ILLNESS:  [name] [name] is a 81 y.o. F with PMH of HTN and stomach CA.  She was seen at her PCP office on 4/20 and found to have AF with RVR.  She was sent to ED where she was placed on diltiazem gtt.  She converted to SR initially, but later had a 7 second pause with a junctional rhythm and HR in the 30's.  She then remained in sinus brady with HR in 50's - 60's.  She was evaluated by EP and decision was made to place pacemaker for symptomatic bradycardia due to sinus node dysfunction.  This was done 4/21.  On CXR AM of 4/22, she was found to have moderate left PTX s/p PPM placement.  PCCM was consulted for recs.  PAST MEDICAL HISTORY :   has a past medical history of Hypertension and Stomach cancer.  has past surgical history that includes Partial gastrectomy (1992); Cholecystectomy; Back surgery; Total abdominal hysterectomy; and permanent pacemaker insertion (N/A, 05/16/2014). Prior to Admission medications   Medication Sig Start Date End Date Taking? Authorizing Provider  acetaminophen (TYLENOL) 500 MG tablet Take 500 mg by mouth every 6 (six) hours as needed for mild pain.   Yes Historical Provider, MD  aspirin 81 MG tablet Take 81 mg by mouth daily.   Yes Historical Provider,  MD  calcium-vitamin D (OSCAL WITH D) 500-200 MG-UNIT per tablet Take 1 tablet by mouth daily with breakfast.   Yes Historical Provider, MD  Collagenase (SANTYL EX) Apply 1 application topically daily.   Yes Historical Provider, MD  Cyanocobalamin (NASCOBAL NA) Place 1 spray into both nostrils once a week. saturday   Yes Historical Provider, MD  denosumab (PROLIA) 60 MG/ML SOLN injection Inject 60 mg into the skin every 6 (six) months. Administer in upper arm, thigh, or abdomen   Yes Historical Provider, MD  ferrous sulfate (SLOW FE) 160 (50 FE) MG TBCR SR tablet Take 160 mg by mouth daily.   Yes Historical Provider, MD  hydrochlorothiazide (HYDRODIURIL) 25 MG tablet Take 12.5 mg by mouth daily.   Yes Historical Provider, MD  LORazepam (ATIVAN) 1 MG tablet Take 1 mg by mouth at bedtime.   Yes Historical Provider, MD  metoprolol (LOPRESSOR) 50 MG tablet Take 50 mg by mouth 2 (two) times daily.   Yes Historical Provider, MD  mometasone (NASONEX) 50 MCG/ACT nasal spray Place 2 sprays into both nostrils daily.   Yes Historical Provider, MD  Multiple Vitamins-Minerals (MULTIVITAMIN WITH MINERALS) tablet Take 1 tablet by mouth daily.   Yes Historical Provider, MD  Omega-3 Fatty Acids (FISH OIL) 1000 MG CPDR Take 1,000 mg by mouth daily.   Yes Historical Provider, MD  Polyethyl Glycol-Propyl Glycol 0.4-0.3 %  SOLN Apply 1 drop to eye daily as needed (dryness).   Yes Historical Provider, MD  potassium chloride (K-DUR,KLOR-CON) 10 MEQ tablet Take 10 mEq by mouth daily.   Yes Historical Provider, MD  simvastatin (ZOCOR) 40 MG tablet Take 40 mg by mouth at bedtime.   Yes Historical Provider, MD  sucralfate (CARAFATE) 1 G tablet Take 1 g by mouth 4 (four) times daily -  before meals and at bedtime.   Yes Historical Provider, MD  traMADol (ULTRAM) 50 MG tablet Take 50-100 mg by mouth 3 (three) times daily as needed (pain).   Yes Historical Provider, MD   No Known Allergies  FAMILY HISTORY:  family history  includes Heart attack (age of onset: 49) in her son; Heart disease in her father; Pneumonia in her mother; Sudden death (age of onset: 27) in her daughter. SOCIAL HISTORY:  reports that she has never smoked. She does not have any smokeless tobacco history on file. She reports that she does not drink alcohol.  REVIEW OF SYSTEMS:   All negative; except for those that are bolded, which indicate positives.  Constitutional: weight loss, weight gain, night sweats, fevers, chills, fatigue, weakness.  HEENT: headaches, sore throat, sneezing, nasal congestion, post nasal drip, difficulty swallowing, tooth/dental problems, visual complaints, visual changes, ear aches. Neuro: difficulty with speech, weakness, numbness, ataxia. CV:  chest pain, orthopnea, PND, swelling in lower extremities, dizziness, palpitations, syncope.  Resp: cough, hemoptysis, dyspnea, wheezing. GI  heartburn, indigestion, abdominal pain, nausea, vomiting, diarrhea, constipation, change in bowel habits, loss of appetite, hematemesis, melena, hematochezia.  GU: dysuria, change in color of urine, urgency or frequency, flank pain, hematuria. MSK: joint pain or swelling, decreased range of motion. Psych: change in mood or affect, depression, anxiety, suicidal ideations, homicidal ideations. Skin: rash, itching, bruising.    SUBJECTIVE:  Denies chest pain, SOB.  States she feels normal.  Currently on NRB, SpO2 98%.  VITAL SIGNS: Temp:  [97.7 F (36.5 C)-99 F (37.2 C)] 97.7 F (36.5 C) (04/22 0551) Pulse Rate:  [87-117] 87 (04/22 0405) Resp:  [15-22] 18 (04/22 0405) BP: (137-191)/(59-90) 143/62 mmHg (04/22 0405) SpO2:  [94 %-100 %] 94 % (04/22 0405) Weight:  [58.287 kg (128 lb 8 oz)] 58.287 kg (128 lb 8 oz) (04/22 0551)  PHYSICAL EXAMINATION: General: Elderly female, resting in bed, in NAD. Neuro: A&O x 3, non-focal.  HEENT: [place]/AT. PERRL, sclerae anicteric. Cardiovascular: RRR, no M/R/G.  Lungs: Respirations even and  unlabored.  Very slightly decreased LUL, otherwise CTA with no W/R/R.  Abdomen: BS x 4, soft, NT/ND.  Musculoskeletal: No gross deformities, no edema.  Skin: Intact, warm, no rashes.   Recent Labs Lab 05/15/14 1111 05/16/14 0607  NA 139 141  K 4.7 4.2  CL 103 105  CO2  --  26  BUN 25* 22  CREATININE 1.00 1.08  GLUCOSE 91 111*    Recent Labs Lab 05/15/14 1047 05/15/14 1111 05/16/14 0607  HGB 11.0* 12.6 9.9*  HCT 34.5* 37.0 31.2*  WBC 8.0  --  7.6  PLT 259  --  251   Dg Chest 2 View  05/17/2014   CLINICAL DATA:  Subsequent encounter for permanent pacemaker placement.  EXAM: CHEST  2 VIEW  COMPARISON:  05/16/2014.  FINDINGS: Two views study shows interval placement of a left-sided dual lead permanent pacemaker. Lead tip overlying the expected location of the right atrial appendage and right ventricular apex. There is a moderate pneumothorax evident on the left. No pleural effusion. Right  lung is clear. Posttraumatic deformity noted in the left humeral neck.  IMPRESSION: Moderate left pneumothorax status post permanent pacemaker placement.  These results will be called to the ordering clinician or representative by the Radiologist Assistant, and communication documented in the PACS or zVision Dashboard.   Electronically Signed   By: Misty Stanley M.D.   On: 05/17/2014 07:53   Dg Chest Port 1 View  05/16/2014   CLINICAL DATA:  Atrial fibrillation  EXAM: PORTABLE CHEST - 1 VIEW  COMPARISON:  None.  FINDINGS: Cardiac shadow is within normal limits. Aortic calcifications are seen. The lungs are well aerated bilaterally. Minimal left basilar atelectasis is noted. No acute bony abnormality is seen. An old left humeral fracture is noted.  IMPRESSION: Mild left basilar atelectasis.   Electronically Signed   By: Inez Catalina M.D.   On: 05/16/2014 07:44    ASSESSMENT / PLAN:  Left Pneumothorax following PPM placement 4/21 - she appears very comfortable on NRB, denies any SOB, chest pain.   Vitals remain stable. Plan: Change NRB to high flow O2 to facilitate re-expansion and sealing of PTX. Repeat pCXR at 11:00AM. If PTX remains unchanged or increases at all, then will require placement of wayne catheter.  Rest per primary.  Son updated at bedside.  Pt and son in agreement of the plan above.   Montey Hora, [facility] Pager: [phone]  or [phone] 05/17/2014, 8:39 AM

## 2014-05-17 NOTE — Progress Notes (Signed)
Report called from [facility] radiology that pt has moderate left pneumothrax. Reported to Chanetta Marshall, NP that pt has moderate left pneumothrax. Chanetta Marshall, NP came to see pt. Pt oxygen sat 100% on non-rebreather.  Dr. Lovena Le notified as well. Will continue to monitor.

## 2014-05-17 NOTE — Progress Notes (Signed)
SUBJECTIVE: The patient is doing well today.  At this time, she denies chest pain, shortness of breath, or any new concerns.  CURRENT MEDICATIONS: . hydrochlorothiazide  12.5 mg Oral Daily  . LORazepam  1 mg Oral QHS  . metoprolol  50 mg Oral BID  . potassium chloride  10 mEq Oral Daily  . simvastatin  40 mg Oral QHS  . sucralfate  1 g Oral TID AC & HS      OBJECTIVE: Physical Exam: Filed Vitals:   05/16/14 2225 05/16/14 2325 05/17/14 0405 05/17/14 0551  BP: 167/90 137/73 143/62   Pulse: 92 87 87   Temp:    97.7 F (36.5 C)  TempSrc:    Oral  Resp: 21 18 18    Height:      Weight:    128 lb 8 oz (58.287 kg)  SpO2: 94% 94% 94%     Intake/Output Summary (Last 24 hours) at 05/17/14 0831 Last data filed at 05/17/14 9485  Gross per 24 hour  Intake 526.67 ml  Output    600 ml  Net -73.33 ml    Telemetry reveals sinus rhythm, short run of AT  GEN- The patient is well appearing, alert and oriented x 3 today.   Head- normocephalic, atraumatic Eyes-  Sclera clear, conjunctiva pink Ears- hearing intact Oropharynx- clear Neck- supple, no JVP Lymph- no cervical lymphadenopathy Lungs- Clear to ausculation bilaterally, normal work of breathing Heart- Regular rate and rhythm, no murmurs, rubs or gallops  GI- soft, NT, ND, + BS Extremities- no clubbing, cyanosis, or edema Skin- no rash or lesion, left chest without hematoma/ecchymosis Psych- euthymic mood, full affect Neuro- strength and sensation are intact  LABS: Basic Metabolic Panel:  Recent Labs  05/15/14 1111 05/16/14 0607  NA 139 141  K 4.7 4.2  CL 103 105  CO2  --  26  GLUCOSE 91 111*  BUN 25* 22  CREATININE 1.00 1.08  CALCIUM  --  9.5   Liver Function Tests:  Recent Labs  05/16/14 0607  AST 26  ALT 23  ALKPHOS 115  BILITOT 0.5  PROT 6.4  ALBUMIN 3.2*   CBC:  Recent Labs  05/15/14 1047 05/15/14 1111 05/16/14 0607  WBC 8.0  --  7.6  HGB 11.0* 12.6 9.9*  HCT 34.5* 37.0 31.2*  MCV  98.0  --  97.5  PLT 259  --  251   Thyroid Function Tests:  Recent Labs  05/16/14 0607  TSH 5.969*    RADIOLOGY: Dg Chest 2 View 05/17/2014   CLINICAL DATA:  Subsequent encounter for permanent pacemaker placement.  EXAM: CHEST  2 VIEW  COMPARISON:  05/16/2014.  FINDINGS: Two views study shows interval placement of a left-sided dual lead permanent pacemaker. Lead tip overlying the expected location of the right atrial appendage and right ventricular apex. There is a moderate pneumothorax evident on the left. No pleural effusion. Right lung is clear. Posttraumatic deformity noted in the left humeral neck.  IMPRESSION: Moderate left pneumothorax status post permanent pacemaker placement.  These results will be called to the ordering clinician or representative by the Radiologist Assistant, and communication documented in the PACS or zVision Dashboard.   Electronically Signed   By: Misty Stanley M.D.   On: 05/17/2014 07:53    ASSESSMENT AND PLAN:  Active Problems:   Atrial fibrillation   Tachy-brady syndrome   Bradycardia   Foot ulcer   Essential hypertension   Sinus node dysfunction   Atrial flutter  1.  Sinus node dysfunction S/p PPM implant 05/16/14 Device interrogation reviewed and normal CXR demonstrates moderate ptx - will call pulmonary to help manage  2.  Atrial flutter CHADS2VASC score is 4 Will resume Eliquis on Monday (unless chest tube required, then would delay restart) Will continue Metoprolol, can add Cardizem if needed for RVR  3.  HTN Stable No change required today  Chanetta Marshall, NP 05/17/2014 8:36 AM   EP Attending  Patient seen and examined. I agree with the above findings and plan. She appears to have a PTX secondary to her PPM. I did not appreciated aspiration of air during the procedure. She will likely need a thoracostomy tube. Will need to hold off on systemic anti-coagulation at this time but would still be able to initiate on Monday if stable.    Myiesha Edgar,M.D.  Cristopher Peru, M.D.

## 2014-05-17 NOTE — Progress Notes (Signed)
Spoke with pt and pt's family. Pt appears to be resting comfortably.   Blood pressure 164/70, pulse 62, temperature 97.4 F (36.3 C), temperature source Oral, resp. rate 13, height 5\' 4"  (1.626 m), weight 128 lb 8 oz (58.287 kg), SpO2 100 %.   HHFNC 30lpm, reduced to 80%.

## 2014-05-18 ENCOUNTER — Inpatient Hospital Stay (HOSPITAL_COMMUNITY): Payer: Medicare Other

## 2014-05-18 DIAGNOSIS — Z789 Other specified health status: Secondary | ICD-10-CM

## 2014-05-18 DIAGNOSIS — J939 Pneumothorax, unspecified: Secondary | ICD-10-CM

## 2014-05-18 MED ORDER — ATORVASTATIN CALCIUM 20 MG PO TABS
20.0000 mg | ORAL_TABLET | Freq: Every day | ORAL | Status: DC
  Administered 2014-05-18 – 2014-05-19 (×2): 20 mg via ORAL
  Filled 2014-05-18 (×2): qty 1

## 2014-05-18 NOTE — Progress Notes (Signed)
Subjective: Stable overnight.  No airleak on chest tube, and no increased wob.  Objective: Vital signs in last 24 hours: Blood pressure 147/61, pulse 69, temperature 97.6 F (36.4 C), temperature source Oral, resp. rate 15, height 5\' 4"  (1.626 m), weight 57.153 kg (126 lb), SpO2 100 %.  Intake/Output from previous day: 04/22 0701 - 04/23 0700 In: 58 [P.O.:420] Out: 450 [Urine:450]   Physical Exam:   wd female in nad Nose without purulence or d/c noted. Chest with basilar crackles, no wheezing Cor with ?rrr LE without edema, no cyanosis Alert and oriented, moves all 4.     Assessment/Plan:  1) Left PTX post PPM placement. The pt is without respiratory distress, and no air leak on tube this am.  -check cxr, and consider d/c tube if stable.    Kathee Delton, M.D. 05/18/2014, 1:29 PM

## 2014-05-18 NOTE — Progress Notes (Signed)
   SUBJECTIVE: The patient is doing better today.  Pain ismuch improved.  . diltiazem  30 mg Oral 4 times per day  . hydrochlorothiazide  12.5 mg Oral Daily  . LORazepam  1 mg Oral QHS  . metoprolol  50 mg Oral BID  . potassium chloride  10 mEq Oral Daily  . simvastatin  40 mg Oral QHS  . sucralfate  1 g Oral TID AC & HS      OBJECTIVE: Physical Exam: Filed Vitals:   05/17/14 2129 05/18/14 0044 05/18/14 0606 05/18/14 0607  BP:  158/57 147/61 147/61  Pulse: 62 60  69  Temp:    97.6 F (36.4 C)  TempSrc:    Oral  Resp: 13 12  15   Height:      Weight:    126 lb (57.153 kg)  SpO2: 100% 100%  100%    Intake/Output Summary (Last 24 hours) at 05/18/14 0936 Last data filed at 05/18/14 0400  Gross per 24 hour  Intake    120 ml  Output    450 ml  Net   -330 ml    Telemetry reveals sinus rhythm  GEN- The patient is elderly and frail appearing, alert and oriented x 3 today.   Head- normocephalic, atraumatic Eyes-  Sclera clear, conjunctiva pink Ears- hearing intact Oropharynx- clear Neck- supple,   Lungs- slightly diminshed at apex, otherwise clear, normal work of breathing.  CT site ok Heart- Regular rate and rhythm  GI- soft, NT, ND, + BS Extremities- no clubbing, cyanosis, or edema Skin- no rash or lesion Psych- euthymic mood, full affect Neuro- strength and sensation are intact  LABS: Basic Metabolic Panel:  Recent Labs  05/15/14 1111 05/16/14 0607  NA 139 141  K 4.7 4.2  CL 103 105  CO2  --  26  GLUCOSE 91 111*  BUN 25* 22  CREATININE 1.00 1.08  CALCIUM  --  9.5   Liver Function Tests:  Recent Labs  05/16/14 0607  AST 26  ALT 23  ALKPHOS 115  BILITOT 0.5  PROT 6.4  ALBUMIN 3.2*   No results for input(s): LIPASE, AMYLASE in the last 72 hours. CBC:  Recent Labs  05/15/14 1047 05/15/14 1111 05/16/14 0607  WBC 8.0  --  7.6  HGB 11.0* 12.6 9.9*  HCT 34.5* 37.0 31.2*  MCV 98.0  --  97.5  PLT 259  --  251   RADIOLOGY: Personally  reviewed CXR,  PTX is near resolved, CT in place  ASSESSMENT AND PLAN:  Active Problems:   Atrial fibrillation   Tachy-brady syndrome   Bradycardia   Foot ulcer   Essential hypertension   Sinus node dysfunction   Atrial flutter   Pneumothorax, left  1. Sinus node dysfunction S/p PPM implant 05/16/14 PTX is much improved,  Hopefully CT can come out in next day or so Up to chair today if ok with pulmonary  2. Atrial flutter CHADS2VASC score is 4 Off eliquis until CT is out. Will continue Metoprolol, can add Cardizem if needed for RVR  3. HTN Stable No change required today  Up to chair if ok with pulm Will ambulate once CT is out.   Thompson Grayer, MD 05/18/2014 9:36 AM

## 2014-05-18 NOTE — Plan of Care (Signed)
Problem: Phase III Progression Outcomes Goal: Pain controlled on oral analgesia Outcome: Completed/Met Date Met:  05/18/14 Pt denies pain except expected surgical painl

## 2014-05-19 ENCOUNTER — Inpatient Hospital Stay (HOSPITAL_COMMUNITY): Payer: Medicare Other

## 2014-05-19 MED ORDER — TRAMADOL HCL 50 MG PO TABS
50.0000 mg | ORAL_TABLET | Freq: Four times a day (QID) | ORAL | Status: DC | PRN
  Administered 2014-05-19: 50 mg via ORAL
  Filled 2014-05-19: qty 2

## 2014-05-19 MED ORDER — CETYLPYRIDINIUM CHLORIDE 0.05 % MT LIQD
7.0000 mL | Freq: Two times a day (BID) | OROMUCOSAL | Status: DC
  Administered 2014-05-19 – 2014-05-20 (×3): 7 mL via OROMUCOSAL

## 2014-05-19 NOTE — Progress Notes (Signed)
Patient taken off of high flow nasal cannula and placed on regular nasal cannula with flow of 5.  Sats currently 100%.  Vitals are stable.  Will continue to monitor.

## 2014-05-19 NOTE — Progress Notes (Signed)
eLink Physician-Brief Progress Note Patient Name: [name] [name] DOB: 06-06-1917 MRN: [id number]   Date of Service  05/19/2014  HPI/Events of Note  CXR with L chest pigtail catheter with side port no longer in chest. Small residual L pneumothorax.   eICU Interventions  Will order: 1. Pull out pigtail catheter.  2. Portable CXR at 9:30 PM.      Intervention Category Intermediate Interventions: Other:  Lysle Dingwall 05/19/2014, 5:15 PM

## 2014-05-19 NOTE — Progress Notes (Signed)
Subjective: Stable overnight.  No airleak on chest tube, and no increased wob.  Objective: Vital signs in last 24 hours: Blood pressure 128/60, pulse 74, temperature 98.3 F (36.8 C), temperature source Axillary, resp. rate 21, height 5\' 4"  (1.626 m), weight 130 lb (58.968 kg), SpO2 100 %.  Intake/Output from previous day: 04/23 0701 - 04/24 0700 In: 70 [P.O.:960] Out: 500 [Urine:500]   Physical Exam:   wd female in nad Nose without purulence or d/c noted. Chest with basilar crackles, no wheezing Cor with rrr LE without edema, no cyanosis Alert and oriented, moves all 4.     Assessment/Plan:  1) Left PTX post PPM placement. The pt is without respiratory distress, and no air leak on tube this am.  She had a cxr ordered for this am, and still has not been done.  Xray yesterday showed improved but persistent small ptx, along with atx at left base due to effusion  -check cxr, and consider d/c tube if stable.    Kathee Delton, M.D. 05/19/2014, 1:50 PM

## 2014-05-19 NOTE — Progress Notes (Signed)
Lt chest tube d/c by Jeneen Rinks, RN ([facility] [address]).  I was at bedside assisting Jeneen Rinks.  Dsg applied.  Dr. Rayann Heman aware.  Pt remains stable, currently 100% on 3L O2 nasal canula.  Will continue to monitor.

## 2014-05-19 NOTE — Progress Notes (Signed)
   SUBJECTIVE: The patient is doing better today.  Pain at chest tube site  . antiseptic oral rinse  7 mL Mouth Rinse BID  . atorvastatin  20 mg Oral QHS  . diltiazem  30 mg Oral 4 times per day  . hydrochlorothiazide  12.5 mg Oral Daily  . LORazepam  1 mg Oral QHS  . metoprolol  50 mg Oral BID  . potassium chloride  10 mEq Oral Daily  . sucralfate  1 g Oral TID AC & HS      OBJECTIVE: Physical Exam: Filed Vitals:   05/18/14 2201 05/18/14 2349 05/19/14 0545 05/19/14 0546  BP: 145/65 126/60 131/66 131/66  Pulse: 81   60  Temp:    98.3 F (36.8 C)  TempSrc:    Axillary  Resp:    13  Height:      Weight:    130 lb (58.968 kg)  SpO2:    100%    Intake/Output Summary (Last 24 hours) at 05/19/14 8110 Last data filed at 05/19/14 0800  Gross per 24 hour  Intake    600 ml  Output    800 ml  Net   -200 ml    Telemetry reveals sinus rhythm  GEN- The patient is elderly and frail appearing, alert and oriented x 3 today.   Head- normocephalic, atraumatic Eyes-  Sclera clear, conjunctiva pink Ears- hearing intact Oropharynx- clear Neck- supple,   Lungs- clear throughout, has difficulty taking deep breath due to chest tube, normal work of breathing.  CT site ok Heart- Regular rate and rhythm  GI- soft, NT, ND, + BS Extremities- no clubbing, cyanosis, or edema Skin- no rash or lesion Psych- euthymic mood, full affect Neuro- strength and sensation are intact  CXR is pending this am  ASSESSMENT AND PLAN:  Active Problems:   Atrial fibrillation   Tachy-brady syndrome   Bradycardia   Foot ulcer   Essential hypertension   Sinus node dysfunction   Atrial flutter   Pneumothorax, left  1. Sinus node dysfunction S/p PPM implant 05/16/14 PTX is much improved  We need to get chest tube out as soon as possible. No air leak.  I have ordered CXR for today.  Hopefully tube can be removed today.  Pulm to see.  2. Atrial flutter CHADS2VASC score is 4 Off eliquis until CT is  out--> hopefully we can restart soon. Will continue Metoprolol, can add Cardizem if needed for RVR  3. HTN Stable No change required today  Up to chair if ok with pulm Will ambulate once CT is out. Dr Lovena Le to see tomorrow   Thompson Grayer, MD 05/19/2014 8:08 AM

## 2014-05-20 ENCOUNTER — Inpatient Hospital Stay (HOSPITAL_COMMUNITY): Payer: Medicare Other

## 2014-05-20 ENCOUNTER — Encounter (HOSPITAL_COMMUNITY): Payer: Self-pay | Admitting: Physician Assistant

## 2014-05-20 MED ORDER — APIXABAN 2.5 MG PO TABS: 2.5000 mg | ORAL_TABLET | Freq: Two times a day (BID) | ORAL | Status: DC

## 2014-05-20 MED ORDER — ATORVASTATIN CALCIUM 20 MG PO TABS: 20.0000 mg | ORAL_TABLET | Freq: Every day | ORAL | Status: DC

## 2014-05-20 MED ORDER — DILTIAZEM HCL ER COATED BEADS 120 MG PO CP24: 120.0000 mg | ORAL_CAPSULE | Freq: Every day | ORAL | Status: DC

## 2014-05-20 NOTE — Discharge Instructions (Signed)
Supplemental Discharge Instructions for  Pacemaker/Defibrillator Patients  Activity No heavy lifting or vigorous activity with your left/right arm for 6 to 8 weeks.  Do not raise your left/right arm above your head for one week.  Gradually raise your affected arm as drawn below.           __4/25/16                            05/21/14                     05/22/14                05/23/14  NO DRIVING for  1 week   ; you may begin driving on  8/91/69   .  WOUND CARE - Keep the wound area clean and dry.  Do not get this area wet for one week. No showers for one week; you may shower on  05/23/14   . - The tape/steri-strips on your wound will fall off; do not pull them off.  No bandage is needed on the site.  DO  NOT apply any creams, oils, or ointments to the wound area. - If you notice any drainage or discharge from the wound, any swelling or bruising at the site, or you develop a fever > 101? F after you are discharged home, call the office at once.  Special Instructions - You are still able to use cellular telephones; use the ear opposite the side where you have your pacemaker/defibrillator.  Avoid carrying your cellular phone near your device. - When traveling through airports, show security personnel your identification card to avoid being screened in the metal detectors.  Ask the security personnel to use the hand wand. - Avoid arc welding equipment, MRI testing (magnetic resonance imaging), TENS units (transcutaneous nerve stimulators).  Call the office for questions about other devices. - Avoid electrical appliances that are in poor condition or are not properly grounded. - Microwave ovens are safe to be near or to operate. Information on my medicine - ELIQUIS (apixaban)  This medication education was reviewed with me or my healthcare representative as part of my discharge preparation.  The pharmacist that spoke with me during my hospital stay was:  Eudelia Bunch, [facility]  Why was  Eliquis prescribed for you? Eliquis was prescribed for you to reduce the risk of forming blood clots that can cause a stroke if you have a medical condition called atrial fibrillation (a type of irregular heartbeat) OR to reduce the risk of a blood clots forming after orthopedic surgery.  What do You need to know about Eliquis ? Take your Eliquis TWICE DAILY - one tablet in the morning and one tablet in the evening with or without food.  It would be best to take the doses about the same time each day.  If you have difficulty swallowing the tablet whole please discuss with your pharmacist how to take the medication safely.  Take Eliquis exactly as prescribed by your doctor and DO NOT stop taking Eliquis without talking to the doctor who prescribed the medication.  Stopping may increase your risk of developing a new clot or stroke.  Refill your prescription before you run out.  After discharge, you should have regular check-up appointments with your healthcare provider that is prescribing your Eliquis.  In the future your dose may need to be changed if your kidney function  or weight changes by a significant amount or as you get older.  What do you do if you miss a dose? If you miss a dose, take it as soon as you remember on the same day and resume taking twice daily.  Do not take more than one dose of ELIQUIS at the same time.  Important Safety Information A possible side effect of Eliquis is bleeding. You should call your healthcare provider right away if you experience any of the following: ? Bleeding from an injury or your nose that does not stop. ? Unusual colored urine (red or dark brown) or unusual colored stools (red or black). ? Unusual bruising for unknown reasons. ? A serious fall or if you hit your head (even if there is no bleeding).  Some medicines may interact with Eliquis and might increase your risk of bleeding or clotting while on Eliquis. To help avoid this, consult  your healthcare provider or pharmacist prior to using any new prescription or non-prescription medications, including herbals, vitamins, non-steroidal anti-inflammatory drugs (NSAIDs) and supplements.  This website has more information on Eliquis (apixaban): [email or web address].no.

## 2014-05-20 NOTE — Discharge Summary (Signed)
CARDIOLOGY DISCHARGE SUMMARY   Patient ID: [name] [name] MRN: [id number] DOB/AGE: [birth year] 79 y.o.  Admit date: 05/15/2014 Discharge date: 05/20/2014  PCP: Jerlyn Ly, MD Primary Cardiologist: Dr Percival Spanish Electrophysiologist: Dr Lovena Le   Primary Discharge Diagnosis: Sinus node dysfunction - s/p St. Jude (serial number [id number]) PPM  Secondary Discharge Diagnosis:    Atrial fibrillation   Tachy-brady syndrome   Bradycardia   Foot ulcer   Essential hypertension   Atrial flutter   Pneumothorax, left   Consults: CCM  Procedures:  1. Left upper extremity venography.  2. Pacemaker implantation.   3. Chest tube implantation  4. Serial chest X rays  Hospital Course: [name] [name] is a 81 y.o. female with a history of HTN. She was admitted 04/20 with atrial flutter, rapid ventricular response.  She was maintained on telemetry during her hospital stay. She was placed on IV Cardizem for rate control, and once her heart rate was better controlled, the medication was changed to oral ambulation. She spontaneously converted to sinus rhythm, but had a 7 second pause. An EP consult was called.  She was seen by Dr. Lovena Le felt to have tachybradycardia syndrome. Permanent pacemaker insertion was recommended. The procedure was performed on 05/16/2014. Results are below.  Once the pacemaker was inserted, she was continued on her home dose of beta blocker plus the Cardizem. She will continue the Cardizem at discharge, in a long-acting form. Because she is on Cardizem, her Zocor will be changed to Lipitor.  Because of the procedures, anticoagulation was held. She will start Eliquis tonight.  She initially tolerated the procedure well. However, on routine follow-up chest x-ray, there was a moderate pneumothorax. A pulmonary consult was called.  It was felt that it would be difficult for this to spontaneously resolve. Because of her age, frailty, and technical details, chest tube  placement would be difficult. High flow O2 and serial chest x-rays were felt to be the best option.  On serial chest x-ray, the pneumothorax was noted to be worsening. It was decided to insert a chest tube. A Wayne catheter was inserted on 05/17/2014, to water seal. Follow-up chest x-ray showed improvement in the pneumothorax. Chest tube was removed on 04/24.  On 04/25, she was seen by Dr. Lovena Le and all data were reviewed. Her follow-up chest x-ray showed a small residual pneumothorax but it had not increased overnight. She is to get a follow-up CXR as and outpatient, arranged by CCM.   [name] [name] is otherwise doing well and is stable from a cardiac standpoint. No further inpatient treatment is indicated and she is considered stable for discharge, to follow-up as an outpatient.   Labs:   Lab Results  Component Value Date   WBC 7.6 05/16/2014   HGB 9.9* 05/16/2014   HCT 31.2* 05/16/2014   MCV 97.5 05/16/2014   PLT 251 05/16/2014     Recent Labs Lab 05/16/14 0607  NA 141  K 4.2  CL 105  CO2 26  BUN 22  CREATININE 1.08  CALCIUM 9.5  PROT 6.4  BILITOT 0.5  ALKPHOS 115  ALT 23  AST 26  GLUCOSE 111*      Radiology: Dg Chest 2 View 05/17/2014   CLINICAL DATA:  Subsequent encounter for permanent pacemaker placement.  EXAM: CHEST  2 VIEW  COMPARISON:  05/16/2014.  FINDINGS: Two views study shows interval placement of a left-sided dual lead permanent pacemaker. Lead tip overlying the expected location of the right atrial appendage and  right ventricular apex. There is a moderate pneumothorax evident on the left. No pleural effusion. Right lung is clear. Posttraumatic deformity noted in the left humeral neck.  IMPRESSION: Moderate left pneumothorax status post permanent pacemaker placement.  These results will be called to the ordering clinician or representative by the Radiologist Assistant, and communication documented in the PACS or zVision Dashboard.   Electronically Signed   By: Misty Stanley M.D.   On: 05/17/2014 07:53   Dg Chest Port 1 View 05/20/2014   CLINICAL DATA:  Follow-up left-sided pneumothorax, status post removal of chest tube. Subsequent encounter.  EXAM: PORTABLE CHEST - 1 VIEW  COMPARISON:  Chest radiograph performed earlier today at 03:11 p.m.  FINDINGS: Status post removal of left-sided chest tube, there is a persistent residual small left apical pneumothorax. There also appears to be a small left-sided pleural effusion. The right lung appears relatively clear.  The cardiomediastinal silhouette is borderline normal in size. A pacemaker is noted overlying the left chest wall, with leads ending overlying the right atrium and right ventricle. No acute osseous abnormalities are seen. There is chronic deformity involving the proximal left humerus.  IMPRESSION: Status post removal of left-sided chest tube, there is a persistent residual small left apical pneumothorax. Small left-sided pleural effusion also noted.   Electronically Signed   By: Garald Balding M.D.   On: 05/20/2014 01:49   Dg Chest Port 1 View 05/19/2014   CLINICAL DATA:  81 year old female with a history of pneumothorax.  EXAM: PORTABLE CHEST - 1 VIEW  COMPARISON:  05/18/2014  FINDINGS: Cardiomediastinal silhouette unchanged in size and contour.  Stable position of left chest wall cardiac pacing device with 2 leads in place.  No confluent airspace disease.  Chronic background lung changes.  Question of small residual pneumothorax on the left.  The left-sided thoracostomy tube has been slightly withdrawn, with side holes outside of the thoracic cavity projecting laterally to the ribs.  Posttraumatic changes of the left humerus.  IMPRESSION: Question of small residual pneumothorax at the apex of the left chest, which would be unchanged from the comparison.  The left-sided thoracostomy tube has been withdrawn, with the side holes projecting outside of the thoracic cavity.  These results were called by telephone at the  time of interpretation on 05/19/2014 at 4:59 pm to the nurse caring for the patient, [name] [name], who verbally acknowledged these results.  Signed,  Dulcy Fanny. Earleen Newport, DO  [facility]  [facility]   Electronically Signed   By: Corrie Mckusick D.O.   On: 05/19/2014 17:03   Dg Chest Port 1 View 05/18/2014   CLINICAL DATA:  Pneumothorax.  EXAM: PORTABLE CHEST - 1 VIEW  COMPARISON:  One-view chest 05/17/2014  FINDINGS: A left apical pneumothorax is slightly smaller than on the prior study. A pleural drain remains in place. Pacing wires are again noted.  Left basilar airspace disease has slightly increased. A left pleural effusion is suspected. Posttraumatic changes of left shoulder are again noted.  IMPRESSION: 1. Slight decrease in small left apical pneumothorax. 2. Stable positioning of the left pleural drain. 3. Increasing left pleural effusion and basilar airspace disease. While this likely reflects atelectasis, early infection must also be considered.   Electronically Signed   By: San Morelle M.D.   On: 05/18/2014 14:39   Dg Chest Port 1 View 05/17/2014   CLINICAL DATA:  Followup pneumothorax.  EXAM: PORTABLE CHEST - 1 VIEW  COMPARISON:  05/17/2014  FINDINGS: Left-sided pacemaker unchanged. Small caliber left-sided chest tube unchanged. Slight interval improvement patient's small left apical pneumothorax. Minimal streaky left retrocardiac density unchanged and likely atelectasis. Cardiomediastinal silhouette and remainder of the exam is unchanged.  IMPRESSION: Slight interval improvement in patient's small left apical pneumothorax. Small caliber left-sided chest tube unchanged.  Minimal streaky density in the left retrocardiac region unchanged likely atelectasis.   Electronically Signed   By: Marin Olp M.D.   On: 05/17/2014 17:44   Dg Chest Port 1 View 05/17/2014   CLINICAL DATA:  Status post small caliber left-sided chest tube placement for  pneumothorax.  EXAM: PORTABLE CHEST - 1 VIEW  COMPARISON:  Portable chest x-ray of earlier today.  FINDINGS: The patient has undergone placement of a small caliber chest tube on the left. The tube projects over the posterior lateral aspects of the sixth and seventh ribs. There has been partial re-expansion of the left lung. There is minimal left basilar atelectasis. The right lung is clear. The heart and pulmonary vascularity are normal. The permanent pacemaker is in reasonable position radiographically. There is chronic deformity of the proximal left humerus.  IMPRESSION: Interval placement of small caliber left-sided chest tube with interval decrease in the size of the left-sided pneumothorax.   Electronically Signed   By: David  Martinique M.D.   On: 05/17/2014 13:15   Dg Chest Port 1 View 05/17/2014   CLINICAL DATA:  Pneumothorax  EXAM: PORTABLE CHEST - 1 VIEW  COMPARISON:  05/17/2014  FINDINGS: Cardiomediastinal silhouette is stable. Dual lead cardiac pacemaker is unchanged in position. Slight increased left pneumothorax about 50 %. There is atelectasis of left lung. Right lung is clear.  IMPRESSION: Dual lead cardiac pacemaker is unchanged in position. Slight increased left pneumothorax about 50 %. There is atelectasis of left lung. Right lung is clear.  These results were called by telephone at the time of interpretation on 05/17/2014 at 11:38 am to patient's nurse on [place], who verbally acknowledged these results.   Electronically Signed   By: Lahoma Crocker M.D.   On: 05/17/2014 11:38   Dg Chest Port 1 View 05/16/2014   CLINICAL DATA:  Atrial fibrillation  EXAM: PORTABLE CHEST - 1 VIEW  COMPARISON:  None.  FINDINGS: Cardiac shadow is within normal limits. Aortic calcifications are seen. The lungs are well aerated bilaterally. Minimal left basilar atelectasis is noted. No acute bony abnormality is seen. An old left humeral fracture is noted.  IMPRESSION: Mild left basilar atelectasis.   Electronically Signed    By: Inez Catalina M.D.   On: 05/16/2014 07:44    PPM Insertion: 05/16/2014 RA/RV Lead Placement: The left axillary vein was therefore directly visualized and cannulated. Through the left axillary vein, a St. Jude (serial number [id number]) right atrial lead and a St. Jude (serial number [id number]) right ventricular lead were advanced with fluoroscopic visualization into the right atrial appendage and right ventricular apical septal positions respectively. Initial atrial lead P- waves measured 2 mV with impedance of 455 ohms and a threshold of 0.5 V at 0.5 msec. Right ventricular lead R-waves measured 17 mV with an impedance of 708 ohms and a threshold of 0.5 V at 0.5 msec. Both leads were secured to the pectoralis fascia using #2-0 silk over the suture sleeves.   Device Placement: The leads were then connected to a St. Jude (serial number [id number]) pacemaker. The pocket was irrigated with copious gentamicin solution. The pacemaker was then placed into the pocket. The pocket was then  closed in 2 layers with 2.0 Vicryl suture for the subcutaneous and subcuticular layers. Steri-Strips and a sterile dressing were then applied. There were no early apparent complications.    CONCLUSIONS:  1. Successful implantation of a St.Jude dual-chamber pacemaker for symptomatic bradycardia due to sinus node dysfunction 2. No early apparent complications.    EKG: 05/16/2014 Atrial flutter, RVR Rate 137  Echo: 05/16/2014 - Left ventricle: The cavity size was normal. There was moderate concentric hypertrophy. Systolic function was normal. The estimated ejection fraction was in the range of 60% to 65%. Wall motion was normal; there were no regional wall motion abnormalities. Doppler parameters are consistent with abnormal left ventricular relaxation (grade 1 diastolic dysfunction). - Aortic valve: There was no regurgitation. - Ascending aorta: The ascending aorta was normal in size. -  Mitral valve: Calcified annulus. Mildly thickened leaflets . - Left atrium: The atrium was normal in size. - Right atrium: The atrium was normal in size. - Tricuspid valve: There was mild regurgitation. - Pulmonic valve: There was no regurgitation. - Pulmonary arteries: Systolic pressure was within the normal range. PA peak pressure: 33 mm Hg (S). - Inferior vena cava: The vessel was normal in size. - Pericardium, extracardiac: There was no pericardial effusion.  FOLLOW UP PLANS AND APPOINTMENTS No Known Allergies   Medication List    STOP taking these medications        aspirin 81 MG tablet     simvastatin 40 MG tablet  Commonly known as:  ZOCOR      TAKE these medications        acetaminophen 500 MG tablet  Commonly known as:  TYLENOL  Take 500 mg by mouth every 6 (six) hours as needed for mild pain.     apixaban 2.5 MG Tabs tablet  Commonly known as:  ELIQUIS  Take 1 tablet (2.5 mg total) by mouth 2 (two) times daily.     atorvastatin 20 MG tablet  Commonly known as:  LIPITOR  Take 1 tablet (20 mg total) by mouth at bedtime.     calcium-vitamin D 500-200 MG-UNIT per tablet  Commonly known as:  OSCAL WITH D  Take 1 tablet by mouth daily with breakfast.     denosumab 60 MG/ML Soln injection  Commonly known as:  PROLIA  Inject 60 mg into the skin every 6 (six) months. Administer in upper arm, thigh, or abdomen     diltiazem 120 MG 24 hr capsule  Commonly known as:  CARDIZEM CD  Take 1 capsule (120 mg total) by mouth daily.     ferrous sulfate 160 (50 FE) MG Tbcr SR tablet  Commonly known as:  SLOW FE  Take 160 mg by mouth daily.     Fish Oil 1000 MG Cpdr  Take 1,000 mg by mouth daily.     hydrochlorothiazide 25 MG tablet  Commonly known as:  HYDRODIURIL  Take 12.5 mg by mouth daily.     LORazepam 1 MG tablet  Commonly known as:  ATIVAN  Take 1 mg by mouth at bedtime.     metoprolol 50 MG tablet  Commonly known as:  LOPRESSOR  Take 50 mg by mouth 2  (two) times daily.     mometasone 50 MCG/ACT nasal spray  Commonly known as:  NASONEX  Place 2 sprays into both nostrils daily.     multivitamin with minerals tablet  Take 1 tablet by mouth daily.     NASCOBAL NA  Place 1 spray into both  nostrils once a week. saturday     Polyethyl Glycol-Propyl Glycol 0.4-0.3 % Soln  Apply 1 drop to eye daily as needed (dryness).     potassium chloride 10 MEQ tablet  Commonly known as:  K-DUR,KLOR-CON  Take 10 mEq by mouth daily.     SANTYL EX  Apply 1 application topically daily.     sucralfate 1 G tablet  Commonly known as:  CARAFATE  Take 1 g by mouth 4 (four) times daily -  before meals and at bedtime.     traMADol 50 MG tablet  Commonly known as:  ULTRAM  Take 50-100 mg by mouth 3 (three) times daily as needed (pain).        Discharge Instructions    Call MD for:  redness, tenderness, or signs of infection (pain, swelling, redness, odor or green/yellow discharge around incision site)    Complete by:  As directed      Call MD for:  temperature >100.4    Complete by:  As directed      Diet - low sodium heart healthy    Complete by:  As directed      Increase activity slowly    Complete by:  As directed           Follow-up Information    Follow up with Patsey Berthold, NP On 06/03/2014.   Specialty:  Nurse Practitioner   Why:  at 11:30AM   Contact information:   [place] [address] [phone]       Follow up with Cristopher Peru, MD On 08/20/2014.   Specialty:  Cardiology   Why:  at [place] information:   [address] [place] [id number] [phone]       BRING ALL MEDICATIONS WITH YOU TO FOLLOW UP APPOINTMENTS  Time spent with patient to include physician time: 39 min Signed: Rosaria Ferries, PA-C 05/20/2014, 10:16 AM Co-Sign MD

## 2014-05-20 NOTE — Progress Notes (Signed)
Patient ID: CARLI LEFEVERS, female   DOB: 1917-11-18, 81 y.o.   MRN: [id number]    Patient Name: [name] [name] Date of Encounter: 05/20/2014     Principal Problem:   Sinus node dysfunction - s/p St. Jude (serial number [id number]) PPM Active Problems:   Atrial fibrillation   Tachy-brady syndrome   Bradycardia   Foot ulcer   Essential hypertension   Atrial flutter   Pneumothorax, left    SUBJECTIVE  No chest pain or sob.   CURRENT MEDS . antiseptic oral rinse  7 mL Mouth Rinse BID  . atorvastatin  20 mg Oral QHS  . diltiazem  30 mg Oral 4 times per day  . hydrochlorothiazide  12.5 mg Oral Daily  . LORazepam  1 mg Oral QHS  . metoprolol  50 mg Oral BID  . potassium chloride  10 mEq Oral Daily  . sucralfate  1 g Oral TID AC & HS    OBJECTIVE  Filed Vitals:   05/19/14 1507 05/19/14 1820 05/19/14 2100 05/20/14 0552  BP: 129/57 127/56 131/55 130/51  Pulse: 70 76 76 70  Temp: 98.5 F (36.9 C)  97.3 F (36.3 C) 97.9 F (36.6 C)  TempSrc: Oral  Oral Oral  Resp: 20 18 21 15   Height:      Weight:    129 lb 12.5 oz (58.868 kg)  SpO2: 100% 99% 98% 100%    Intake/Output Summary (Last 24 hours) at 05/20/14 0748 Last data filed at 05/19/14 2129  Gross per 24 hour  Intake    360 ml  Output    650 ml  Net   -290 ml   Filed Weights   05/18/14 0607 05/19/14 0546 05/20/14 0552  Weight: 126 lb (57.153 kg) 130 lb (58.968 kg) 129 lb 12.5 oz (58.868 kg)    PHYSICAL EXAM  General: Pleasant, NAD. Neuro: Alert and oriented X 3. Moves all extremities spontaneously. Psych: Normal affect. HEENT:  Normal  Neck: Supple without bruits or JVD. Lungs:  Resp regular and unlabored, CTA. Heart: RRR no s3, s4, or murmurs. Abdomen: Soft, non-tender, non-distended, BS + x 4.  Extremities: No clubbing, cyanosis or edema. DP/PT/Radials 2+ and equal bilaterally.  Accessory Clinical Findings  CBC No results for input(s): WBC, NEUTROABS, HGB, HCT, MCV, PLT in the last 72 hours. Basic  Metabolic Panel No results for input(s): NA, K, CL, CO2, GLUCOSE, BUN, CREATININE, CALCIUM, MG, PHOS in the last 72 hours. Liver Function Tests No results for input(s): AST, ALT, ALKPHOS, BILITOT, PROT, ALBUMIN in the last 72 hours. No results for input(s): LIPASE, AMYLASE in the last 72 hours. Cardiac Enzymes No results for input(s): CKTOTAL, CKMB, CKMBINDEX, TROPONINI in the last 72 hours. BNP Invalid input(s): POCBNP D-Dimer No results for input(s): DDIMER in the last 72 hours. Hemoglobin A1C No results for input(s): HGBA1C in the last 72 hours. Fasting Lipid Panel No results for input(s): CHOL, HDL, LDLCALC, TRIG, CHOLHDL, LDLDIRECT in the last 72 hours. Thyroid Function Tests No results for input(s): TSH, T4TOTAL, T3FREE, THYROIDAB in the last 72 hours.  Invalid input(s): FREET3  TELE  nsr  Radiology/Studies  Dg Chest 2 View  05/17/2014   CLINICAL DATA:  Subsequent encounter for permanent pacemaker placement.  EXAM: CHEST  2 VIEW  COMPARISON:  05/16/2014.  FINDINGS: Two views study shows interval placement of a left-sided dual lead permanent pacemaker. Lead tip overlying the expected location of the right atrial appendage and right ventricular apex. There is a moderate pneumothorax  evident on the left. No pleural effusion. Right lung is clear. Posttraumatic deformity noted in the left humeral neck.  IMPRESSION: Moderate left pneumothorax status post permanent pacemaker placement.  These results will be called to the ordering clinician or representative by the Radiologist Assistant, and communication documented in the PACS or zVision Dashboard.   Electronically Signed   By: Misty Stanley M.D.   On: 05/17/2014 07:53   Dg Chest Port 1 View  05/20/2014   CLINICAL DATA:  Follow-up left-sided pneumothorax, status post removal of chest tube. Subsequent encounter.  EXAM: PORTABLE CHEST - 1 VIEW  COMPARISON:  Chest radiograph performed earlier today at 03:11 p.m.  FINDINGS: Status post  removal of left-sided chest tube, there is a persistent residual small left apical pneumothorax. There also appears to be a small left-sided pleural effusion. The right lung appears relatively clear.  The cardiomediastinal silhouette is borderline normal in size. A pacemaker is noted overlying the left chest wall, with leads ending overlying the right atrium and right ventricle. No acute osseous abnormalities are seen. There is chronic deformity involving the proximal left humerus.  IMPRESSION: Status post removal of left-sided chest tube, there is a persistent residual small left apical pneumothorax. Small left-sided pleural effusion also noted.   Electronically Signed   By: Garald Balding M.D.   On: 05/20/2014 01:49   Dg Chest Port 1 View  05/19/2014   CLINICAL DATA:  81 year old female with a history of pneumothorax.  EXAM: PORTABLE CHEST - 1 VIEW  COMPARISON:  05/18/2014  FINDINGS: Cardiomediastinal silhouette unchanged in size and contour.  Stable position of left chest wall cardiac pacing device with 2 leads in place.  No confluent airspace disease.  Chronic background lung changes.  Question of small residual pneumothorax on the left.  The left-sided thoracostomy tube has been slightly withdrawn, with side holes outside of the thoracic cavity projecting laterally to the ribs.  Posttraumatic changes of the left humerus.  IMPRESSION: Question of small residual pneumothorax at the apex of the left chest, which would be unchanged from the comparison.  The left-sided thoracostomy tube has been withdrawn, with the side holes projecting outside of the thoracic cavity.  These results were called by telephone at the time of interpretation on 05/19/2014 at 4:59 pm to the nurse caring for the patient, Ms Abigail Butts, who verbally acknowledged these results.  Signed,  Dulcy Fanny. Earleen Newport, DO  [facility]  [facility]   Electronically Signed   By: Corrie Mckusick D.O.   On:  05/19/2014 17:03   Dg Chest Port 1 View  05/18/2014   CLINICAL DATA:  Pneumothorax.  EXAM: PORTABLE CHEST - 1 VIEW  COMPARISON:  One-view chest 05/17/2014  FINDINGS: A left apical pneumothorax is slightly smaller than on the prior study. A pleural drain remains in place. Pacing wires are again noted.  Left basilar airspace disease has slightly increased. A left pleural effusion is suspected. Posttraumatic changes of left shoulder are again noted.  IMPRESSION: 1. Slight decrease in small left apical pneumothorax. 2. Stable positioning of the left pleural drain. 3. Increasing left pleural effusion and basilar airspace disease. While this likely reflects atelectasis, early infection must also be considered.   Electronically Signed   By: San Morelle M.D.   On: 05/18/2014 14:39   Dg Chest Port 1 View  05/17/2014   CLINICAL DATA:  Followup pneumothorax.  EXAM: PORTABLE CHEST - 1 VIEW  COMPARISON:  05/17/2014  FINDINGS: Left-sided pacemaker unchanged.  Small caliber left-sided chest tube unchanged. Slight interval improvement patient's small left apical pneumothorax. Minimal streaky left retrocardiac density unchanged and likely atelectasis. Cardiomediastinal silhouette and remainder of the exam is unchanged.  IMPRESSION: Slight interval improvement in patient's small left apical pneumothorax. Small caliber left-sided chest tube unchanged.  Minimal streaky density in the left retrocardiac region unchanged likely atelectasis.   Electronically Signed   By: Marin Olp M.D.   On: 05/17/2014 17:44   Dg Chest Port 1 View  05/17/2014   CLINICAL DATA:  Status post small caliber left-sided chest tube placement for pneumothorax.  EXAM: PORTABLE CHEST - 1 VIEW  COMPARISON:  Portable chest x-ray of earlier today.  FINDINGS: The patient has undergone placement of a small caliber chest tube on the left. The tube projects over the posterior lateral aspects of the sixth and seventh ribs. There has been partial  re-expansion of the left lung. There is minimal left basilar atelectasis. The right lung is clear. The heart and pulmonary vascularity are normal. The permanent pacemaker is in reasonable position radiographically. There is chronic deformity of the proximal left humerus.  IMPRESSION: Interval placement of small caliber left-sided chest tube with interval decrease in the size of the left-sided pneumothorax.   Electronically Signed   By: David  Martinique M.D.   On: 05/17/2014 13:15   Dg Chest Port 1 View  05/17/2014   CLINICAL DATA:  Pneumothorax  EXAM: PORTABLE CHEST - 1 VIEW  COMPARISON:  05/17/2014  FINDINGS: Cardiomediastinal silhouette is stable. Dual lead cardiac pacemaker is unchanged in position. Slight increased left pneumothorax about 50 %. There is atelectasis of left lung. Right lung is clear.  IMPRESSION: Dual lead cardiac pacemaker is unchanged in position. Slight increased left pneumothorax about 50 %. There is atelectasis of left lung. Right lung is clear.  These results were called by telephone at the time of interpretation on 05/17/2014 at 11:38 am to patient's nurse on [place], who verbally acknowledged these results.   Electronically Signed   By: Lahoma Crocker M.D.   On: 05/17/2014 11:38   Dg Chest Port 1 View  05/16/2014   CLINICAL DATA:  Atrial fibrillation  EXAM: PORTABLE CHEST - 1 VIEW  COMPARISON:  None.  FINDINGS: Cardiac shadow is within normal limits. Aortic calcifications are seen. The lungs are well aerated bilaterally. Minimal left basilar atelectasis is noted. No acute bony abnormality is seen. An old left humeral fracture is noted.  IMPRESSION: Mild left basilar atelectasis.   Electronically Signed   By: Inez Catalina M.D.   On: 05/16/2014 07:44    ASSESSMENT AND PLAN  1. Symptomatic bradycardia 2. Atrial fib/flutter with a RVR 3. S/p PPM 4. Postop PTX 5. Chest tube for #4, removed with small residual PTX Rec: ok for discharge home today, usual post op followup. Start Eliquis  2.5 mg twice daily, first dose tonight.   Gregg Taylor,M.D.  Gregg Taylor,M.D.  05/20/2014 7:48 AM

## 2014-05-20 NOTE — Progress Notes (Signed)
Subjective: Chest tubed D/c'd yesterday. Stable overnight, small residual PTX after CT removal. Cardiology to discharge today.   Objective: Vital signs in last 24 hours: Blood pressure 118/52, pulse 60, temperature 97.9 F (36.6 C), temperature source Oral, resp. rate 21, height 5\' 4"  (1.626 m), weight 58.868 kg (129 lb 12.5 oz), SpO2 100 %.  Intake/Output from previous day: 04/24 0701 - 04/25 0700 In: 360 [P.O.:360] Out: 650 [Urine:650]   Physical Exam:   wd female in nad Nose without purulence or d/c noted. Chest with basilar crackles, no wheezing Cor with rrr LE without edema, no cyanosis Alert and oriented, moves all 4.     Assessment/Plan:  1) Left PTX post PPM placement. The pt is without respiratory distress.  Chest tube DC/d 4/24 PM. CXR this AM shows small residual apical PTX. Cardiology to discharge.   - recommend CXR ~48 hours after discharge to assess PTX.   - Will set up with PCP.   Georgann Housekeeper, AGACNP-BC [facility] Pulmonology/Critical Care Pager [phone] or [phone]  05/20/2014 10:18 AM

## 2014-05-21 DIAGNOSIS — I509 Heart failure, unspecified: Secondary | ICD-10-CM | POA: Diagnosis not present

## 2014-05-21 DIAGNOSIS — L97329 Non-pressure chronic ulcer of left ankle with unspecified severity: Secondary | ICD-10-CM | POA: Diagnosis not present

## 2014-05-22 DIAGNOSIS — I10 Essential (primary) hypertension: Secondary | ICD-10-CM | POA: Diagnosis not present

## 2014-05-22 DIAGNOSIS — R2689 Other abnormalities of gait and mobility: Secondary | ICD-10-CM | POA: Diagnosis not present

## 2014-05-22 DIAGNOSIS — Z6822 Body mass index (BMI) 22.0-22.9, adult: Secondary | ICD-10-CM | POA: Diagnosis not present

## 2014-05-22 DIAGNOSIS — J939 Pneumothorax, unspecified: Secondary | ICD-10-CM | POA: Diagnosis not present

## 2014-05-22 DIAGNOSIS — I4892 Unspecified atrial flutter: Secondary | ICD-10-CM | POA: Diagnosis not present

## 2014-05-24 DIAGNOSIS — I509 Heart failure, unspecified: Secondary | ICD-10-CM | POA: Diagnosis not present

## 2014-05-24 DIAGNOSIS — L97329 Non-pressure chronic ulcer of left ankle with unspecified severity: Secondary | ICD-10-CM | POA: Diagnosis not present

## 2014-05-26 DIAGNOSIS — Z48812 Encounter for surgical aftercare following surgery on the circulatory system: Secondary | ICD-10-CM | POA: Diagnosis not present

## 2014-05-26 DIAGNOSIS — I872 Venous insufficiency (chronic) (peripheral): Secondary | ICD-10-CM | POA: Diagnosis not present

## 2014-05-28 DIAGNOSIS — I872 Venous insufficiency (chronic) (peripheral): Secondary | ICD-10-CM | POA: Diagnosis not present

## 2014-05-28 DIAGNOSIS — Z48812 Encounter for surgical aftercare following surgery on the circulatory system: Secondary | ICD-10-CM | POA: Diagnosis not present

## 2014-05-29 DIAGNOSIS — L97329 Non-pressure chronic ulcer of left ankle with unspecified severity: Secondary | ICD-10-CM | POA: Diagnosis not present

## 2014-05-29 DIAGNOSIS — L89522 Pressure ulcer of left ankle, stage 2: Secondary | ICD-10-CM | POA: Diagnosis not present

## 2014-05-29 DIAGNOSIS — T148 Other injury of unspecified body region: Secondary | ICD-10-CM | POA: Diagnosis not present

## 2014-06-03 ENCOUNTER — Encounter: Payer: Self-pay | Admitting: Nurse Practitioner

## 2014-06-03 ENCOUNTER — Ambulatory Visit (INDEPENDENT_AMBULATORY_CARE_PROVIDER_SITE_OTHER): Payer: Medicare Other | Admitting: Nurse Practitioner

## 2014-06-03 VITALS — BP 148/68 | HR 60 | Ht 64.0 in | Wt 132.4 lb

## 2014-06-03 DIAGNOSIS — I480 Paroxysmal atrial fibrillation: Secondary | ICD-10-CM

## 2014-06-03 DIAGNOSIS — R001 Bradycardia, unspecified: Secondary | ICD-10-CM | POA: Diagnosis not present

## 2014-06-03 DIAGNOSIS — I495 Sick sinus syndrome: Secondary | ICD-10-CM

## 2014-06-03 LAB — CUP PACEART INCLINIC DEVICE CHECK
Lead Channel Impedance Value: 450 Ohm
Lead Channel Pacing Threshold Amplitude: 0.5 V
Lead Channel Pacing Threshold Amplitude: 0.5 V
Lead Channel Pacing Threshold Pulse Width: 0.5 ms
Lead Channel Sensing Intrinsic Amplitude: 2 mV
Lead Channel Setting Pacing Pulse Width: 0.5 ms
MDC IDC MSMT BATTERY VOLTAGE: 3.08 V
MDC IDC MSMT LEADCHNL RA IMPEDANCE VALUE: 360 Ohm
MDC IDC MSMT LEADCHNL RA PACING THRESHOLD PULSEWIDTH: 0.5 ms
MDC IDC MSMT LEADCHNL RV SENSING INTR AMPL: 12 mV
MDC IDC SESS DTM: 20160509121024
MDC IDC SET LEADCHNL RA PACING AMPLITUDE: 3.5 V
MDC IDC SET LEADCHNL RV PACING AMPLITUDE: 3.5 V
MDC IDC STAT BRADY RA PERCENT PACED: 50 %
MDC IDC STAT BRADY RV PERCENT PACED: 1 %
Pulse Gen Model: 2240
Pulse Gen Serial Number: 7761558

## 2014-06-03 LAB — BASIC METABOLIC PANEL
BUN: 29 mg/dL — ABNORMAL HIGH (ref 6–23)
CO2: 28 mEq/L (ref 19–32)
Calcium: 9.4 mg/dL (ref 8.4–10.5)
Chloride: 105 mEq/L (ref 96–112)
Creatinine, Ser: 0.99 mg/dL (ref 0.40–1.20)
GFR: 55.41 mL/min — ABNORMAL LOW (ref >60.00)
Glucose, Bld: 98 mg/dL (ref 70–99)
Potassium: 3.9 mEq/L (ref 3.5–5.1)
Sodium: 138 mEq/L (ref 135–145)

## 2014-06-03 LAB — CBC WITH DIFFERENTIAL/PLATELET
BASOS ABS: 0 10*3/uL (ref 0.0–0.1)
Basophils Relative: 0.6 % (ref 0.0–3.0)
EOS PCT: 3.1 % (ref 0.0–5.0)
Eosinophils Absolute: 0.2 10*3/uL (ref 0.0–0.7)
HEMATOCRIT: 28.9 % — AB (ref 36.0–46.0)
Hemoglobin: 9.7 g/dL — ABNORMAL LOW (ref 12.0–15.0)
LYMPHS ABS: 1.6 10*3/uL (ref 0.7–4.0)
LYMPHS PCT: 19.4 % (ref 12.0–46.0)
MCHC: 33.7 g/dL (ref 30.0–36.0)
MCV: 92.3 fl (ref 78.0–100.0)
MONOS PCT: 8.4 % (ref 3.0–12.0)
Monocytes Absolute: 0.7 10*3/uL (ref 0.1–1.0)
Neutro Abs: 5.5 10*3/uL (ref 1.4–7.7)
Neutrophils Relative %: 68.5 % (ref 43.0–77.0)
PLATELETS: 210 10*3/uL (ref 150.0–400.0)
RBC: 3.13 Mil/uL — ABNORMAL LOW (ref 3.87–5.11)
RDW: 14 % (ref 11.5–15.5)
WBC: 8.1 10*3/uL (ref 4.0–10.5)

## 2014-06-03 NOTE — Progress Notes (Signed)
Electrophysiology Office Note Date: 06/03/2014  ID:  [name] [name], DOB 07/19/1917, MRN [id number]  PCP: Jerlyn Ly, MD Electrophysiologist: Lovena Le  CC: Pacemaker follow-up  [name] [name] is a 81 y.o. female is seen today for Dr Lovena Le.  She was recently admitted after being seen at her PCP's office and was found to be in AF with RVR.  She was placed on diltiazem and converted to SR with a 7 second post termination pause with resultant junctional rates in the 30's.  She was evaluated by Dr Lovena Le and underwent pacemaker implantation that was complicated by a ptx and required a chest tube for resolution.  Since discharge, the patient reports doing very well.  She denies chest pain, palpitations, dyspnea, PND, orthopnea, nausea, vomiting, dizziness, syncope.  She had a follow up chest x-ray with Dr Joylene Draft which demonstrated resolution of ptx.   Device History: STJ dual chamber PPM implanted 2016 for tachy-brady syndrome   Past Medical History  Diagnosis Date  . Hypertension   . Stomach cancer     1992  . Atrial fibrillation     a. on Eliquis  . Tachy-brady syndrome     a. s/p STJ dual chamber PPM implant   Past Surgical History  Procedure Laterality Date  . Partial gastrectomy  1992  . Cholecystectomy    . Back surgery    . Total abdominal hysterectomy    . Permanent pacemaker insertion N/A 05/16/2014    STJ Assurity dual chamber pacemaker implanted by Dr Lovena Le    Current Outpatient Prescriptions  Medication Sig Dispense Refill  . acetaminophen (TYLENOL) 500 MG tablet Take 500 mg by mouth every 6 (six) hours as needed for mild pain.    Marland Kitchen apixaban (ELIQUIS) 2.5 MG TABS tablet Take 1 tablet (2.5 mg total) by mouth 2 (two) times daily. 60 tablet 0  . atorvastatin (LIPITOR) 20 MG tablet Take 1 tablet (20 mg total) by mouth at bedtime. 30 tablet 11  . calcium-vitamin D (OSCAL WITH D) 500-200 MG-UNIT per tablet Take 1 tablet by mouth daily with breakfast.    . Collagenase  (SANTYL EX) Apply 1 application topically daily.    . Cyanocobalamin (NASCOBAL NA) Place 1 spray into both nostrils once a week. saturday    . denosumab (PROLIA) 60 MG/ML SOLN injection Inject 60 mg into the skin every 6 (six) months. Administer in upper arm, thigh, or abdomen    . diltiazem (CARDIZEM CD) 120 MG 24 hr capsule Take 1 capsule (120 mg total) by mouth daily. 30 capsule 11  . ferrous sulfate (SLOW FE) 160 (50 FE) MG TBCR SR tablet Take 160 mg by mouth daily.    . hydrochlorothiazide (HYDRODIURIL) 25 MG tablet Take 12.5 mg by mouth daily.    Marland Kitchen LORazepam (ATIVAN) 1 MG tablet Take 1 mg by mouth at bedtime.    . metoprolol (LOPRESSOR) 50 MG tablet Take 50 mg by mouth 2 (two) times daily.    . mometasone (NASONEX) 50 MCG/ACT nasal spray Place 2 sprays into both nostrils daily.    . Multiple Vitamins-Minerals (MULTIVITAMIN WITH MINERALS) tablet Take 1 tablet by mouth daily.    . Omega-3 Fatty Acids (FISH OIL) 1000 MG CPDR Take 1,000 mg by mouth daily.    [name] [name] 0.4-0.3 % SOLN Apply 1 drop to eye daily as needed (dryness).    . potassium chloride (K-DUR,KLOR-CON) 10 MEQ tablet Take 10 mEq by mouth daily.    . sucralfate (CARAFATE) 1  G tablet Take 1 g by mouth 4 (four) times daily -  before meals and at bedtime.    . traMADol (ULTRAM) 50 MG tablet Take 50-100 mg by mouth 3 (three) times daily as needed (pain).     No current facility-administered medications for this visit.    Allergies:   Review of patient's allergies indicates no known allergies.   Social History: History   Social History  . Marital Status: Married    Spouse Name: N/A  . Number of Children: 4  . Years of Education: N/A   Occupational History  . Not on file.   Social History Main Topics  . Smoking status: Never Smoker   . Smokeless tobacco: Not on file  . Alcohol Use: No  . Drug Use: Not on file  . Sexual Activity: Not on file   Other Topics Concern  . Not on file   Social  History Narrative   Lives alone.      Family History: Family History  Problem Relation Age of Onset  . Heart disease Father     Dropsy  . Pneumonia Mother   . Sudden death Daughter 13  . Heart attack Son 26     Review of Systems: All other systems reviewed and are otherwise negative except as noted above.   Physical Exam: VS:  BP 180/72 mmHg  Pulse 60  Ht 5\' 4"  (1.626 m)  Wt 132 lb 6.4 oz (60.056 kg)  BMI 22.72 kg/m2  SpO2 98% , BMI Body mass index is 22.72 kg/(m^2).  GEN- The patient is elderly, thin appearing, alert and oriented x 3 today.   HEENT: normocephalic, atraumatic; sclera clear, conjunctiva pink; hearing intact; oropharynx clear; neck supple, no JVP Lymph- no cervical lymphadenopathy Lungs- Clear to ausculation bilaterally, normal work of breathing.  No wheezes, rales, rhonchi Heart- Regular rate and rhythm, no murmurs, rubs or gallops  GI- soft, non-tender, non-distended, bowel sounds present Extremities- left leg 2+ edema to knee, left foot in boot with healing ulcer, right leg with no clubbing, cyanosis, or edema; DP/PT/radial pulses 1+ bilaterally MS- no significant deformity or atrophy Skin- warm and dry, no rash or lesion; PPM pocket well healed Psych- euthymic mood, full affect Neuro- strength and sensation are intact  PPM Interrogation- reviewed in detail today,  See PACEART report  EKG:  EKG is not ordered today.  Recent Labs: 05/16/2014: ALT 23; BUN 22; Creatinine 1.08; Hemoglobin 9.9*; Platelets 251; Potassium 4.2; Sodium 141; TSH 5.969*   Wt Readings from Last 3 Encounters:  06/03/14 132 lb 6.4 oz (60.056 kg)  05/20/14 129 lb 12.5 oz (58.868 kg)     Other studies Reviewed: Additional studies/ records that were reviewed today include: hospital records  Assessment and Plan:  1.  Tachy/brady syndrome Normal PPM function Wound well healed See Pace Art report Atrial pacing 50% of the time, rate response turned on today.  2.   HTN Stable No change required today  3.  Atrial fibrillation Continue Eliquis for CHADS2VASC score of at least 4 BMET, CBC today   Current medicines are reviewed at length with the patient today.   The patient does not have concerns regarding her medicines.  The following changes were made today:  none  Labs/ tests ordered today include:  Orders Placed This Encounter  Procedures  . CBC with Differential/Platelet  . Basic Metabolic Panel (BMET)     Disposition:   Follow up with Dr Lovena Le in July as scheduled   Signed,  Chanetta Marshall, NP 06/03/2014 11:38 AM  [facility] [address] [place] [address] [phone] (office) [phone] (fax)

## 2014-06-03 NOTE — Patient Instructions (Signed)
Medication Instructions: - no changes  Labwork: - Your physician recommends that you have lab work today: BMP/ CBC  Procedures/Testing: - none  Follow-Up: - Keep your follow up scheduled with Dr. Lovena Le- Tuesday 08/20/14 at 10:00 am   Any Additional Special Instructions Will Be Listed Below (If Applicable). - none

## 2014-06-05 ENCOUNTER — Encounter (HOSPITAL_COMMUNITY): Payer: Self-pay

## 2014-06-05 ENCOUNTER — Encounter (HOSPITAL_COMMUNITY)
Admit: 2014-06-05 | Discharge: 2014-06-05 | Disposition: A | Discharge status: Home / Self Care | Payer: Medicare Other | Source: Ambulatory Visit | Attending: Internal Medicine | Admitting: Internal Medicine

## 2014-06-05 ENCOUNTER — Other Ambulatory Visit (HOSPITAL_COMMUNITY): Payer: Self-pay | Admitting: Internal Medicine

## 2014-06-05 DIAGNOSIS — K219 Gastro-esophageal reflux disease without esophagitis: Secondary | ICD-10-CM | POA: Diagnosis not present

## 2014-06-05 DIAGNOSIS — M810 Age-related osteoporosis without current pathological fracture: Secondary | ICD-10-CM | POA: Insufficient documentation

## 2014-06-05 DIAGNOSIS — N182 Chronic kidney disease, stage 2 (mild): Secondary | ICD-10-CM | POA: Insufficient documentation

## 2014-06-05 MED ORDER — DENOSUMAB 60 MG/ML ~~LOC~~ SOLN
60.0000 mg | Freq: Once | SUBCUTANEOUS | Status: AC
  Administered 2014-06-05: 60 mg via SUBCUTANEOUS
  Filled 2014-06-05: qty 1

## 2014-06-05 NOTE — Discharge Instructions (Signed)
Denosumab injection What is this medicine? DENOSUMAB (den oh sue mab) slows bone breakdown. Prolia is used to treat osteoporosis in women after menopause and in men. Xgeva is used to prevent bone fractures and other bone problems caused by cancer bone metastases. Xgeva is also used to treat giant cell tumor of the bone. This medicine may be used for other purposes; ask your health care provider or pharmacist if you have questions. COMMON BRAND NAME(S): Prolia, XGEVA What should I tell my health care provider before I take this medicine? They need to know if you have any of these conditions: -dental disease -eczema -infection or history of infections -kidney disease or on dialysis -low blood calcium or vitamin D -malabsorption syndrome -scheduled to have surgery or tooth extraction -taking medicine that contains denosumab -thyroid or parathyroid disease -an unusual reaction to denosumab, other medicines, foods, dyes, or preservatives -pregnant or trying to get pregnant -breast-feeding How should I use this medicine? This medicine is for injection under the skin. It is given by a health care professional in a hospital or clinic setting. If you are getting Prolia, a special MedGuide will be given to you by the pharmacist with each prescription and refill. Be sure to read this information carefully each time. For Prolia, talk to your pediatrician regarding the use of this medicine in children. Special care may be needed. For Xgeva, talk to your pediatrician regarding the use of this medicine in children. While this drug may be prescribed for children as young as 13 years for selected conditions, precautions do apply. Overdosage: If you think you've taken too much of this medicine contact a poison control center or emergency room at once. Overdosage: If you think you have taken too much of this medicine contact a poison control center or emergency room at once. NOTE: This medicine is only for  you. Do not share this medicine with others. What if I miss a dose? It is important not to miss your dose. Call your doctor or health care professional if you are unable to keep an appointment. What may interact with this medicine? Do not take this medicine with any of the following medications: -other medicines containing denosumab This medicine may also interact with the following medications: -medicines that suppress the immune system -medicines that treat cancer -steroid medicines like prednisone or cortisone This list may not describe all possible interactions. Give your health care provider a list of all the medicines, herbs, non-prescription drugs, or dietary supplements you use. Also tell them if you smoke, drink alcohol, or use illegal drugs. Some items may interact with your medicine. What should I watch for while using this medicine? Visit your doctor or health care professional for regular checks on your progress. Your doctor or health care professional may order blood tests and other tests to see how you are doing. Call your doctor or health care professional if you get a cold or other infection while receiving this medicine. Do not treat yourself. This medicine may decrease your body's ability to fight infection. You should make sure you get enough calcium and vitamin D while you are taking this medicine, unless your doctor tells you not to. Discuss the foods you eat and the vitamins you take with your health care professional. See your dentist regularly. Brush and floss your teeth as directed. Before you have any dental work done, tell your dentist you are receiving this medicine. Do not become pregnant while taking this medicine or for 5 months after stopping   it. Women should inform their doctor if they wish to become pregnant or think they might be pregnant. There is a potential for serious side effects to an unborn child. Talk to your health care professional or pharmacist for more  information. What side effects may I notice from receiving this medicine? Side effects that you should report to your doctor or health care professional as soon as possible: -allergic reactions like skin rash, itching or hives, swelling of the face, lips, or tongue -breathing problems -chest pain -fast, irregular heartbeat -feeling faint or lightheaded, falls -fever, chills, or any other sign of infection -muscle spasms, tightening, or twitches -numbness or tingling -skin blisters or bumps, or is dry, peels, or red -slow healing or unexplained pain in the mouth or jaw -unusual bleeding or bruising Side effects that usually do not require medical attention (Report these to your doctor or health care professional if they continue or are bothersome.): -muscle pain -stomach upset, gas This list may not describe all possible side effects. Call your doctor for medical advice about side effects. You may report side effects to FDA at 1-800-FDA-1088. Where should I keep my medicine? This medicine is only given in a clinic, doctor's office, or other health care setting and will not be stored at home. NOTE: This sheet is a summary. It may not cover all possible information. If you have questions about this medicine, talk to your doctor, pharmacist, or health care provider.  2015, Elsevier/Gold Standard. (2011-07-12 12:37:47)  

## 2014-06-06 DIAGNOSIS — Z48812 Encounter for surgical aftercare following surgery on the circulatory system: Secondary | ICD-10-CM | POA: Diagnosis not present

## 2014-06-06 DIAGNOSIS — I872 Venous insufficiency (chronic) (peripheral): Secondary | ICD-10-CM | POA: Diagnosis not present

## 2014-06-11 DIAGNOSIS — Z48812 Encounter for surgical aftercare following surgery on the circulatory system: Secondary | ICD-10-CM | POA: Diagnosis not present

## 2014-06-11 DIAGNOSIS — I872 Venous insufficiency (chronic) (peripheral): Secondary | ICD-10-CM | POA: Diagnosis not present

## 2014-06-12 ENCOUNTER — Ambulatory Visit (HOSPITAL_COMMUNITY)
Admission: RE | Admit: 2014-06-12 | Discharge: 2014-06-12 | Disposition: A | Discharge status: Home / Self Care | Payer: Medicare Other | Source: Ambulatory Visit | Attending: Cardiology | Admitting: Cardiology

## 2014-06-12 ENCOUNTER — Other Ambulatory Visit (HOSPITAL_COMMUNITY): Payer: Self-pay | Admitting: Foot & Ankle Surgery

## 2014-06-12 DIAGNOSIS — M7989 Other specified soft tissue disorders: Secondary | ICD-10-CM | POA: Insufficient documentation

## 2014-06-12 DIAGNOSIS — I82402 Acute embolism and thrombosis of unspecified deep veins of left lower extremity: Secondary | ICD-10-CM

## 2014-06-12 DIAGNOSIS — S91302D Unspecified open wound, left foot, subsequent encounter: Secondary | ICD-10-CM | POA: Diagnosis not present

## 2014-06-12 DIAGNOSIS — I82409 Acute embolism and thrombosis of unspecified deep veins of unspecified lower extremity: Secondary | ICD-10-CM | POA: Diagnosis not present

## 2014-06-18 DIAGNOSIS — I872 Venous insufficiency (chronic) (peripheral): Secondary | ICD-10-CM | POA: Diagnosis not present

## 2014-06-18 DIAGNOSIS — Z48812 Encounter for surgical aftercare following surgery on the circulatory system: Secondary | ICD-10-CM | POA: Diagnosis not present

## 2014-06-20 ENCOUNTER — Encounter (HOSPITAL_COMMUNITY): Payer: Medicare Other

## 2014-06-21 ENCOUNTER — Telehealth (HOSPITAL_COMMUNITY): Payer: Self-pay | Admitting: *Deleted

## 2014-06-25 ENCOUNTER — Encounter: Payer: Self-pay | Admitting: Internal Medicine

## 2014-06-25 DIAGNOSIS — Z1389 Encounter for screening for other disorder: Secondary | ICD-10-CM | POA: Diagnosis not present

## 2014-06-25 DIAGNOSIS — J939 Pneumothorax, unspecified: Secondary | ICD-10-CM | POA: Diagnosis not present

## 2014-06-25 DIAGNOSIS — Z6822 Body mass index (BMI) 22.0-22.9, adult: Secondary | ICD-10-CM | POA: Diagnosis not present

## 2014-06-25 DIAGNOSIS — I872 Venous insufficiency (chronic) (peripheral): Secondary | ICD-10-CM | POA: Diagnosis not present

## 2014-06-25 DIAGNOSIS — D649 Anemia, unspecified: Secondary | ICD-10-CM | POA: Diagnosis not present

## 2014-06-25 DIAGNOSIS — I4891 Unspecified atrial fibrillation: Secondary | ICD-10-CM | POA: Diagnosis not present

## 2014-06-25 DIAGNOSIS — I10 Essential (primary) hypertension: Secondary | ICD-10-CM | POA: Diagnosis not present

## 2014-06-25 DIAGNOSIS — R7301 Impaired fasting glucose: Secondary | ICD-10-CM | POA: Diagnosis not present

## 2014-06-25 DIAGNOSIS — L97529 Non-pressure chronic ulcer of other part of left foot with unspecified severity: Secondary | ICD-10-CM | POA: Diagnosis not present

## 2014-06-25 DIAGNOSIS — Z48812 Encounter for surgical aftercare following surgery on the circulatory system: Secondary | ICD-10-CM | POA: Diagnosis not present

## 2014-06-26 DIAGNOSIS — L97329 Non-pressure chronic ulcer of left ankle with unspecified severity: Secondary | ICD-10-CM | POA: Diagnosis not present

## 2014-06-26 DIAGNOSIS — L89521 Pressure ulcer of left ankle, stage 1: Secondary | ICD-10-CM | POA: Diagnosis not present

## 2014-06-26 DIAGNOSIS — S91302D Unspecified open wound, left foot, subsequent encounter: Secondary | ICD-10-CM | POA: Diagnosis not present

## 2014-06-27 DIAGNOSIS — Z48812 Encounter for surgical aftercare following surgery on the circulatory system: Secondary | ICD-10-CM | POA: Diagnosis not present

## 2014-06-27 DIAGNOSIS — I872 Venous insufficiency (chronic) (peripheral): Secondary | ICD-10-CM | POA: Diagnosis not present

## 2014-06-28 DIAGNOSIS — I872 Venous insufficiency (chronic) (peripheral): Secondary | ICD-10-CM | POA: Diagnosis not present

## 2014-06-28 DIAGNOSIS — Z48812 Encounter for surgical aftercare following surgery on the circulatory system: Secondary | ICD-10-CM | POA: Diagnosis not present

## 2014-07-01 DIAGNOSIS — Z48812 Encounter for surgical aftercare following surgery on the circulatory system: Secondary | ICD-10-CM | POA: Diagnosis not present

## 2014-07-01 DIAGNOSIS — I872 Venous insufficiency (chronic) (peripheral): Secondary | ICD-10-CM | POA: Diagnosis not present

## 2014-07-03 DIAGNOSIS — I872 Venous insufficiency (chronic) (peripheral): Secondary | ICD-10-CM | POA: Diagnosis not present

## 2014-07-03 DIAGNOSIS — Z48812 Encounter for surgical aftercare following surgery on the circulatory system: Secondary | ICD-10-CM | POA: Diagnosis not present

## 2014-07-05 DIAGNOSIS — Z48812 Encounter for surgical aftercare following surgery on the circulatory system: Secondary | ICD-10-CM | POA: Diagnosis not present

## 2014-07-05 DIAGNOSIS — I872 Venous insufficiency (chronic) (peripheral): Secondary | ICD-10-CM | POA: Diagnosis not present

## 2014-07-08 DIAGNOSIS — Z48812 Encounter for surgical aftercare following surgery on the circulatory system: Secondary | ICD-10-CM | POA: Diagnosis not present

## 2014-07-08 DIAGNOSIS — I872 Venous insufficiency (chronic) (peripheral): Secondary | ICD-10-CM | POA: Diagnosis not present

## 2014-07-10 DIAGNOSIS — Z48812 Encounter for surgical aftercare following surgery on the circulatory system: Secondary | ICD-10-CM | POA: Diagnosis not present

## 2014-07-10 DIAGNOSIS — I872 Venous insufficiency (chronic) (peripheral): Secondary | ICD-10-CM | POA: Diagnosis not present

## 2014-07-11 DIAGNOSIS — L89521 Pressure ulcer of left ankle, stage 1: Secondary | ICD-10-CM | POA: Diagnosis not present

## 2014-07-11 DIAGNOSIS — S91302D Unspecified open wound, left foot, subsequent encounter: Secondary | ICD-10-CM | POA: Diagnosis not present

## 2014-07-11 DIAGNOSIS — L97329 Non-pressure chronic ulcer of left ankle with unspecified severity: Secondary | ICD-10-CM | POA: Diagnosis not present

## 2014-07-15 DIAGNOSIS — I872 Venous insufficiency (chronic) (peripheral): Secondary | ICD-10-CM | POA: Diagnosis not present

## 2014-07-15 DIAGNOSIS — Z48812 Encounter for surgical aftercare following surgery on the circulatory system: Secondary | ICD-10-CM | POA: Diagnosis not present

## 2014-07-17 DIAGNOSIS — Z48812 Encounter for surgical aftercare following surgery on the circulatory system: Secondary | ICD-10-CM | POA: Diagnosis not present

## 2014-07-17 DIAGNOSIS — I872 Venous insufficiency (chronic) (peripheral): Secondary | ICD-10-CM | POA: Diagnosis not present

## 2014-07-22 DIAGNOSIS — I872 Venous insufficiency (chronic) (peripheral): Secondary | ICD-10-CM | POA: Diagnosis not present

## 2014-07-22 DIAGNOSIS — Z48812 Encounter for surgical aftercare following surgery on the circulatory system: Secondary | ICD-10-CM | POA: Diagnosis not present

## 2014-07-25 DIAGNOSIS — I872 Venous insufficiency (chronic) (peripheral): Secondary | ICD-10-CM | POA: Diagnosis not present

## 2014-07-25 DIAGNOSIS — L97322 Non-pressure chronic ulcer of left ankle with fat layer exposed: Secondary | ICD-10-CM | POA: Diagnosis not present

## 2014-07-26 DIAGNOSIS — L89521 Pressure ulcer of left ankle, stage 1: Secondary | ICD-10-CM | POA: Diagnosis not present

## 2014-07-26 DIAGNOSIS — L97329 Non-pressure chronic ulcer of left ankle with unspecified severity: Secondary | ICD-10-CM | POA: Diagnosis not present

## 2014-07-26 DIAGNOSIS — S91302D Unspecified open wound, left foot, subsequent encounter: Secondary | ICD-10-CM | POA: Diagnosis not present

## 2014-07-31 DIAGNOSIS — I872 Venous insufficiency (chronic) (peripheral): Secondary | ICD-10-CM | POA: Diagnosis not present

## 2014-07-31 DIAGNOSIS — L97322 Non-pressure chronic ulcer of left ankle with fat layer exposed: Secondary | ICD-10-CM | POA: Diagnosis not present

## 2014-08-02 DIAGNOSIS — I872 Venous insufficiency (chronic) (peripheral): Secondary | ICD-10-CM | POA: Diagnosis not present

## 2014-08-02 DIAGNOSIS — L97322 Non-pressure chronic ulcer of left ankle with fat layer exposed: Secondary | ICD-10-CM | POA: Diagnosis not present

## 2014-08-05 DIAGNOSIS — I872 Venous insufficiency (chronic) (peripheral): Secondary | ICD-10-CM | POA: Diagnosis not present

## 2014-08-05 DIAGNOSIS — L97322 Non-pressure chronic ulcer of left ankle with fat layer exposed: Secondary | ICD-10-CM | POA: Diagnosis not present

## 2014-08-08 DIAGNOSIS — R2689 Other abnormalities of gait and mobility: Secondary | ICD-10-CM | POA: Diagnosis not present

## 2014-08-08 DIAGNOSIS — Z6823 Body mass index (BMI) 23.0-23.9, adult: Secondary | ICD-10-CM | POA: Diagnosis not present

## 2014-08-08 DIAGNOSIS — E538 Deficiency of other specified B group vitamins: Secondary | ICD-10-CM | POA: Diagnosis not present

## 2014-08-08 DIAGNOSIS — E119 Type 2 diabetes mellitus without complications: Secondary | ICD-10-CM | POA: Diagnosis not present

## 2014-08-08 DIAGNOSIS — R05 Cough: Secondary | ICD-10-CM | POA: Diagnosis not present

## 2014-08-08 DIAGNOSIS — N390 Urinary tract infection, site not specified: Secondary | ICD-10-CM | POA: Diagnosis not present

## 2014-08-08 DIAGNOSIS — N182 Chronic kidney disease, stage 2 (mild): Secondary | ICD-10-CM | POA: Diagnosis not present

## 2014-08-08 DIAGNOSIS — I10 Essential (primary) hypertension: Secondary | ICD-10-CM | POA: Diagnosis not present

## 2014-08-08 DIAGNOSIS — L97529 Non-pressure chronic ulcer of other part of left foot with unspecified severity: Secondary | ICD-10-CM | POA: Diagnosis not present

## 2014-08-08 DIAGNOSIS — D649 Anemia, unspecified: Secondary | ICD-10-CM | POA: Diagnosis not present

## 2014-08-09 DIAGNOSIS — I872 Venous insufficiency (chronic) (peripheral): Secondary | ICD-10-CM | POA: Diagnosis not present

## 2014-08-09 DIAGNOSIS — L97322 Non-pressure chronic ulcer of left ankle with fat layer exposed: Secondary | ICD-10-CM | POA: Diagnosis not present

## 2014-08-12 DIAGNOSIS — S91302D Unspecified open wound, left foot, subsequent encounter: Secondary | ICD-10-CM | POA: Diagnosis not present

## 2014-08-14 DIAGNOSIS — L97322 Non-pressure chronic ulcer of left ankle with fat layer exposed: Secondary | ICD-10-CM | POA: Diagnosis not present

## 2014-08-14 DIAGNOSIS — I872 Venous insufficiency (chronic) (peripheral): Secondary | ICD-10-CM | POA: Diagnosis not present

## 2014-08-15 DIAGNOSIS — R05 Cough: Secondary | ICD-10-CM | POA: Diagnosis not present

## 2014-08-15 DIAGNOSIS — I4891 Unspecified atrial fibrillation: Secondary | ICD-10-CM | POA: Diagnosis not present

## 2014-08-15 DIAGNOSIS — E538 Deficiency of other specified B group vitamins: Secondary | ICD-10-CM | POA: Diagnosis not present

## 2014-08-15 DIAGNOSIS — I10 Essential (primary) hypertension: Secondary | ICD-10-CM | POA: Diagnosis not present

## 2014-08-15 DIAGNOSIS — Z6822 Body mass index (BMI) 22.0-22.9, adult: Secondary | ICD-10-CM | POA: Diagnosis not present

## 2014-08-15 DIAGNOSIS — D649 Anemia, unspecified: Secondary | ICD-10-CM | POA: Diagnosis not present

## 2014-08-16 DIAGNOSIS — I872 Venous insufficiency (chronic) (peripheral): Secondary | ICD-10-CM | POA: Diagnosis not present

## 2014-08-16 DIAGNOSIS — L97322 Non-pressure chronic ulcer of left ankle with fat layer exposed: Secondary | ICD-10-CM | POA: Diagnosis not present

## 2014-08-19 ENCOUNTER — Ambulatory Visit (INDEPENDENT_AMBULATORY_CARE_PROVIDER_SITE_OTHER): Payer: Medicare Other | Admitting: Internal Medicine

## 2014-08-19 ENCOUNTER — Encounter: Payer: Self-pay | Admitting: Internal Medicine

## 2014-08-19 VITALS — BP 162/66 | HR 60 | Ht 64.0 in | Wt 126.2 lb

## 2014-08-19 DIAGNOSIS — I10 Essential (primary) hypertension: Secondary | ICD-10-CM | POA: Diagnosis not present

## 2014-08-19 DIAGNOSIS — L97322 Non-pressure chronic ulcer of left ankle with fat layer exposed: Secondary | ICD-10-CM | POA: Diagnosis not present

## 2014-08-19 DIAGNOSIS — I480 Paroxysmal atrial fibrillation: Secondary | ICD-10-CM | POA: Diagnosis not present

## 2014-08-19 DIAGNOSIS — I495 Sick sinus syndrome: Secondary | ICD-10-CM | POA: Diagnosis not present

## 2014-08-19 DIAGNOSIS — I872 Venous insufficiency (chronic) (peripheral): Secondary | ICD-10-CM | POA: Diagnosis not present

## 2014-08-19 DIAGNOSIS — R001 Bradycardia, unspecified: Secondary | ICD-10-CM

## 2014-08-19 LAB — CUP PACEART INCLINIC DEVICE CHECK
Battery Remaining Longevity: 124.8 mo
Battery Voltage: 3.02 V
Brady Statistic RA Percent Paced: 70 %
Brady Statistic RV Percent Paced: 0.01 %
Date Time Interrogation Session: 20160725102624
Lead Channel Impedance Value: 400 Ohm
Lead Channel Impedance Value: 512.5 Ohm
Lead Channel Pacing Threshold Amplitude: 0.5 V
Lead Channel Pacing Threshold Pulse Width: 0.5 ms
Lead Channel Sensing Intrinsic Amplitude: 12 mV
Lead Channel Sensing Intrinsic Amplitude: 2.5 mV
Lead Channel Setting Pacing Amplitude: 2 V
Lead Channel Setting Pacing Amplitude: 2.5 V
Lead Channel Setting Pacing Pulse Width: 0.5 ms
Lead Channel Setting Sensing Sensitivity: 2 mV
MDC IDC MSMT LEADCHNL RA PACING THRESHOLD PULSEWIDTH: 0.5 ms
MDC IDC MSMT LEADCHNL RV PACING THRESHOLD AMPLITUDE: 1 V
MDC IDC PG SERIAL: 7761558

## 2014-08-19 NOTE — Assessment & Plan Note (Signed)
She has done well. Only one episode lasting over 3 hours which was minimally symptomatic. Will continue Eliquis.

## 2014-08-19 NOTE — Assessment & Plan Note (Signed)
She is undergoing evaluation. She is wearing a boot and is being treated with hyperbaric oxygen.

## 2014-08-19 NOTE — Assessment & Plan Note (Signed)
Her blood pressure is elevated today. At home it is better. She will followup with Dr. [facility] in several months. She is encouraged to maintain a low sodium diet.

## 2014-08-19 NOTE — Assessment & Plan Note (Signed)
Her St. Jude PM is working normally. Will follow.

## 2014-08-19 NOTE — Patient Instructions (Addendum)
Medication Instructions:  Your physician recommends that you continue on your current medications as directed. Please refer to the Current Medication list given to you today.  Labwork: NONE  Testing/Procedures: NONE  Follow-Up: Your physician wants you to follow-up in: 9 months with Dr. Lovena Le. You will receive a reminder letter in the mail two months in advance. If you don't receive a letter, please call our office to schedule the follow-up appointment.  Remote monitoring is used to monitor your Pacemaker or ICD from home. This monitoring reduces the number of office visits required to check your device to one time per year. It allows [place] to keep an eye on the functioning of your device to ensure it is working properly. You are scheduled for a device check from home on 11/18/2014. You may send your transmission at any time that day. If you have a wireless device, the transmission will be sent automatically. After your physician reviews your transmission, you will receive a postcard with your next transmission date.   Any Other Special Instructions Will Be Listed Below (If Applicable).

## 2014-08-19 NOTE — Progress Notes (Signed)
HPI Mrs. Ederer returns today for followup. She is a pleasant 81 yo woman with symptomatic bradycardia, PAF, HTN, who underwent PPM insertion 3 months ago. She returns today for followup. She has no chest pain or sob. No syncope. She has an ulcer on her foot.  No Known Allergies   Current Outpatient Prescriptions  Medication Sig Dispense Refill  . acetaminophen (TYLENOL) 500 MG tablet Take 500 mg by mouth every 6 (six) hours as needed for mild pain.    Marland Kitchen apixaban (ELIQUIS) 2.5 MG TABS tablet Take 1 tablet (2.5 mg total) by mouth 2 (two) times daily. 60 tablet 0  . atorvastatin (LIPITOR) 20 MG tablet Take 1 tablet (20 mg total) by mouth at bedtime. 30 tablet 11  . calcium-vitamin D (OSCAL WITH D) 500-200 MG-UNIT per tablet Take 1 tablet by mouth daily with breakfast.    . Collagenase (SANTYL EX) Apply 1 application topically daily.    . Cyanocobalamin (NASCOBAL NA) Place 1 spray into both nostrils once a week. saturday    . denosumab (PROLIA) 60 MG/ML SOLN injection Inject 60 mg into the skin every 6 (six) months. Administer in upper arm, thigh, or abdomen    . diltiazem (CARDIZEM CD) 120 MG 24 hr capsule Take 1 capsule (120 mg total) by mouth daily. 30 capsule 11  . ferrous sulfate (SLOW FE) 160 (50 FE) MG TBCR SR tablet Take 160 mg by mouth daily.    . hydrochlorothiazide (HYDRODIURIL) 25 MG tablet Take 12.5 mg by mouth daily.    Marland Kitchen LORazepam (ATIVAN) 1 MG tablet Take 1 mg by mouth at bedtime.    . metoprolol (LOPRESSOR) 50 MG tablet Take 50 mg by mouth 2 (two) times daily.    . mometasone (NASONEX) 50 MCG/ACT nasal spray Place 2 sprays into both nostrils daily.    . Multiple Vitamins-Minerals (MULTIVITAMIN WITH MINERALS) tablet Take 1 tablet by mouth daily.    . Omega-3 Fatty Acids (FISH OIL) 1000 MG CPDR Take 1,000 mg by mouth daily.    Vladimir Faster Glycol-Propyl Glycol 0.4-0.3 % SOLN Apply 1 drop to eye daily as needed (dryness).    . potassium chloride (K-DUR,KLOR-CON) 10 MEQ  tablet Take 10 mEq by mouth daily.    . sucralfate (CARAFATE) 1 G tablet Take 1 g by mouth 4 (four) times daily -  before meals and at bedtime.    . traMADol (ULTRAM) 50 MG tablet Take 50-100 mg by mouth 3 (three) times daily as needed (pain).    . Vitamin D, Ergocalciferol, (DRISDOL) 50000 UNITS CAPS capsule Take 1 capsule by mouth once a week.  2   No current facility-administered medications for this visit.     Past Medical History  Diagnosis Date  . Hypertension   . Stomach cancer     1992  . Atrial fibrillation     a. on Eliquis  . Tachy-brady syndrome     a. s/p STJ dual chamber PPM implant    ROS:   All systems reviewed and negative except as noted in the HPI.   Past Surgical History  Procedure Laterality Date  . Partial gastrectomy  1992  . Cholecystectomy    . Back surgery    . Total abdominal hysterectomy    . Permanent pacemaker insertion N/A 05/16/2014    STJ Assurity dual chamber pacemaker implanted by Dr Lovena Le     Family History  Problem Relation Age of Onset  . Heart disease Father  Dropsy  . Pneumonia Mother   . Sudden death Daughter 1  . Heart attack Son 71     History   Social History  . Marital Status: Married    Spouse Name: N/A  . Number of Children: 4  . Years of Education: N/A   Occupational History  . Not on file.   Social History Main Topics  . Smoking status: Never Smoker   . Smokeless tobacco: Not on file  . Alcohol Use: No  . Drug Use: Not on file  . Sexual Activity: Not on file   Other Topics Concern  . Not on file   Social History Narrative   Lives alone.       BP 162/66 mmHg  Pulse 60  Ht 5\' 4"  (1.626 m)  Wt 126 lb 3.2 oz (57.244 kg)  BMI 21.65 kg/m2  SpO2 98%  Physical Exam:  Well appearing elderly woman, NAD HEENT: Unremarkable Neck:  6 cm JVD, no thyromegally Back:  No CVA tenderness Lungs:  Clear with no wheezes HEART:  Regular rate rhythm, no murmurs, no rubs, no clicks Abd:  soft, positive  bowel sounds, no organomegally, no rebound, no guarding Ext:  2 plus pulses, no edema, no cyanosis, no clubbing Skin:  No rashes no nodules Neuro:  CN II through XII intact, motor grossly intact  DEVICE  Normal device function.  See PaceArt for details.   Assess/Plan:

## 2014-08-20 ENCOUNTER — Encounter: Payer: Medicare Other | Admitting: Internal Medicine

## 2014-08-23 DIAGNOSIS — L97322 Non-pressure chronic ulcer of left ankle with fat layer exposed: Secondary | ICD-10-CM | POA: Diagnosis not present

## 2014-08-23 DIAGNOSIS — I872 Venous insufficiency (chronic) (peripheral): Secondary | ICD-10-CM | POA: Diagnosis not present

## 2014-08-26 DIAGNOSIS — L97329 Non-pressure chronic ulcer of left ankle with unspecified severity: Secondary | ICD-10-CM | POA: Diagnosis not present

## 2014-08-26 DIAGNOSIS — S91302D Unspecified open wound, left foot, subsequent encounter: Secondary | ICD-10-CM | POA: Diagnosis not present

## 2014-08-28 DIAGNOSIS — I872 Venous insufficiency (chronic) (peripheral): Secondary | ICD-10-CM | POA: Diagnosis not present

## 2014-08-28 DIAGNOSIS — L97322 Non-pressure chronic ulcer of left ankle with fat layer exposed: Secondary | ICD-10-CM | POA: Diagnosis not present

## 2014-08-30 DIAGNOSIS — I872 Venous insufficiency (chronic) (peripheral): Secondary | ICD-10-CM | POA: Diagnosis not present

## 2014-08-30 DIAGNOSIS — L97322 Non-pressure chronic ulcer of left ankle with fat layer exposed: Secondary | ICD-10-CM | POA: Diagnosis not present

## 2014-09-02 DIAGNOSIS — I872 Venous insufficiency (chronic) (peripheral): Secondary | ICD-10-CM | POA: Diagnosis not present

## 2014-09-02 DIAGNOSIS — L97322 Non-pressure chronic ulcer of left ankle with fat layer exposed: Secondary | ICD-10-CM | POA: Diagnosis not present

## 2014-09-04 ENCOUNTER — Encounter (HOSPITAL_BASED_OUTPATIENT_CLINIC_OR_DEPARTMENT_OTHER): Payer: Medicare Other | Attending: Surgery

## 2014-09-04 DIAGNOSIS — I10 Essential (primary) hypertension: Secondary | ICD-10-CM | POA: Diagnosis not present

## 2014-09-04 DIAGNOSIS — L97322 Non-pressure chronic ulcer of left ankle with fat layer exposed: Secondary | ICD-10-CM | POA: Diagnosis not present

## 2014-09-04 DIAGNOSIS — I70243 Atherosclerosis of native arteries of left leg with ulceration of ankle: Secondary | ICD-10-CM | POA: Diagnosis not present

## 2014-09-06 DIAGNOSIS — I872 Venous insufficiency (chronic) (peripheral): Secondary | ICD-10-CM | POA: Diagnosis not present

## 2014-09-06 DIAGNOSIS — L97322 Non-pressure chronic ulcer of left ankle with fat layer exposed: Secondary | ICD-10-CM | POA: Diagnosis not present

## 2014-09-09 DIAGNOSIS — I872 Venous insufficiency (chronic) (peripheral): Secondary | ICD-10-CM | POA: Diagnosis not present

## 2014-09-09 DIAGNOSIS — L97322 Non-pressure chronic ulcer of left ankle with fat layer exposed: Secondary | ICD-10-CM | POA: Diagnosis not present

## 2014-09-11 ENCOUNTER — Other Ambulatory Visit: Payer: Self-pay | Admitting: Surgery

## 2014-09-11 ENCOUNTER — Ambulatory Visit (HOSPITAL_COMMUNITY)
Admission: RE | Admit: 2014-09-11 | Discharge: 2014-09-11 | Disposition: A | Discharge status: Home / Self Care | Payer: Medicare Other | Source: Ambulatory Visit | Attending: Vascular Surgery | Admitting: Vascular Surgery

## 2014-09-11 DIAGNOSIS — L97329 Non-pressure chronic ulcer of left ankle with unspecified severity: Secondary | ICD-10-CM

## 2014-09-11 DIAGNOSIS — L97322 Non-pressure chronic ulcer of left ankle with fat layer exposed: Secondary | ICD-10-CM | POA: Diagnosis not present

## 2014-09-11 DIAGNOSIS — I70243 Atherosclerosis of native arteries of left leg with ulceration of ankle: Secondary | ICD-10-CM | POA: Diagnosis not present

## 2014-09-11 DIAGNOSIS — I10 Essential (primary) hypertension: Secondary | ICD-10-CM | POA: Diagnosis not present

## 2014-09-13 DIAGNOSIS — L97322 Non-pressure chronic ulcer of left ankle with fat layer exposed: Secondary | ICD-10-CM | POA: Diagnosis not present

## 2014-09-13 DIAGNOSIS — I872 Venous insufficiency (chronic) (peripheral): Secondary | ICD-10-CM | POA: Diagnosis not present

## 2014-09-16 DIAGNOSIS — I872 Venous insufficiency (chronic) (peripheral): Secondary | ICD-10-CM | POA: Diagnosis not present

## 2014-09-16 DIAGNOSIS — L97322 Non-pressure chronic ulcer of left ankle with fat layer exposed: Secondary | ICD-10-CM | POA: Diagnosis not present

## 2014-09-18 DIAGNOSIS — L97322 Non-pressure chronic ulcer of left ankle with fat layer exposed: Secondary | ICD-10-CM | POA: Diagnosis not present

## 2014-09-18 DIAGNOSIS — I10 Essential (primary) hypertension: Secondary | ICD-10-CM | POA: Diagnosis not present

## 2014-09-18 DIAGNOSIS — I70243 Atherosclerosis of native arteries of left leg with ulceration of ankle: Secondary | ICD-10-CM | POA: Diagnosis not present

## 2014-09-20 DIAGNOSIS — L97322 Non-pressure chronic ulcer of left ankle with fat layer exposed: Secondary | ICD-10-CM | POA: Diagnosis not present

## 2014-09-20 DIAGNOSIS — I872 Venous insufficiency (chronic) (peripheral): Secondary | ICD-10-CM | POA: Diagnosis not present

## 2014-09-23 DIAGNOSIS — I872 Venous insufficiency (chronic) (peripheral): Secondary | ICD-10-CM | POA: Diagnosis not present

## 2014-09-23 DIAGNOSIS — L97322 Non-pressure chronic ulcer of left ankle with fat layer exposed: Secondary | ICD-10-CM | POA: Diagnosis not present

## 2014-09-24 DIAGNOSIS — I872 Venous insufficiency (chronic) (peripheral): Secondary | ICD-10-CM | POA: Diagnosis not present

## 2014-09-24 DIAGNOSIS — L97322 Non-pressure chronic ulcer of left ankle with fat layer exposed: Secondary | ICD-10-CM | POA: Diagnosis not present

## 2014-09-25 DIAGNOSIS — I10 Essential (primary) hypertension: Secondary | ICD-10-CM | POA: Diagnosis not present

## 2014-09-25 DIAGNOSIS — I70243 Atherosclerosis of native arteries of left leg with ulceration of ankle: Secondary | ICD-10-CM | POA: Diagnosis not present

## 2014-09-25 DIAGNOSIS — S91002A Unspecified open wound, left ankle, initial encounter: Secondary | ICD-10-CM | POA: Diagnosis not present

## 2014-09-25 DIAGNOSIS — L97322 Non-pressure chronic ulcer of left ankle with fat layer exposed: Secondary | ICD-10-CM | POA: Diagnosis not present

## 2014-09-27 DIAGNOSIS — L97322 Non-pressure chronic ulcer of left ankle with fat layer exposed: Secondary | ICD-10-CM | POA: Diagnosis not present

## 2014-09-27 DIAGNOSIS — I872 Venous insufficiency (chronic) (peripheral): Secondary | ICD-10-CM | POA: Diagnosis not present

## 2014-10-01 DIAGNOSIS — L97322 Non-pressure chronic ulcer of left ankle with fat layer exposed: Secondary | ICD-10-CM | POA: Diagnosis not present

## 2014-10-01 DIAGNOSIS — I872 Venous insufficiency (chronic) (peripheral): Secondary | ICD-10-CM | POA: Diagnosis not present

## 2014-10-02 ENCOUNTER — Encounter (HOSPITAL_BASED_OUTPATIENT_CLINIC_OR_DEPARTMENT_OTHER): Payer: Medicare Other | Attending: Surgery

## 2014-10-02 DIAGNOSIS — S91002A Unspecified open wound, left ankle, initial encounter: Secondary | ICD-10-CM | POA: Diagnosis not present

## 2014-10-02 DIAGNOSIS — I70243 Atherosclerosis of native arteries of left leg with ulceration of ankle: Secondary | ICD-10-CM | POA: Diagnosis not present

## 2014-10-02 DIAGNOSIS — I10 Essential (primary) hypertension: Secondary | ICD-10-CM | POA: Insufficient documentation

## 2014-10-02 DIAGNOSIS — M1990 Unspecified osteoarthritis, unspecified site: Secondary | ICD-10-CM | POA: Insufficient documentation

## 2014-10-02 DIAGNOSIS — L97322 Non-pressure chronic ulcer of left ankle with fat layer exposed: Secondary | ICD-10-CM | POA: Insufficient documentation

## 2014-10-02 DIAGNOSIS — D649 Anemia, unspecified: Secondary | ICD-10-CM | POA: Insufficient documentation

## 2014-10-07 DIAGNOSIS — I872 Venous insufficiency (chronic) (peripheral): Secondary | ICD-10-CM | POA: Diagnosis not present

## 2014-10-07 DIAGNOSIS — L97322 Non-pressure chronic ulcer of left ankle with fat layer exposed: Secondary | ICD-10-CM | POA: Diagnosis not present

## 2014-10-09 ENCOUNTER — Encounter: Payer: Self-pay | Admitting: Internal Medicine

## 2014-10-09 DIAGNOSIS — I70243 Atherosclerosis of native arteries of left leg with ulceration of ankle: Secondary | ICD-10-CM | POA: Diagnosis not present

## 2014-10-09 DIAGNOSIS — L97321 Non-pressure chronic ulcer of left ankle limited to breakdown of skin: Secondary | ICD-10-CM | POA: Diagnosis not present

## 2014-10-09 DIAGNOSIS — I10 Essential (primary) hypertension: Secondary | ICD-10-CM | POA: Diagnosis not present

## 2014-10-09 DIAGNOSIS — M1990 Unspecified osteoarthritis, unspecified site: Secondary | ICD-10-CM | POA: Diagnosis not present

## 2014-10-09 DIAGNOSIS — L97322 Non-pressure chronic ulcer of left ankle with fat layer exposed: Secondary | ICD-10-CM | POA: Diagnosis not present

## 2014-10-09 DIAGNOSIS — D649 Anemia, unspecified: Secondary | ICD-10-CM | POA: Diagnosis not present

## 2014-10-11 DIAGNOSIS — I872 Venous insufficiency (chronic) (peripheral): Secondary | ICD-10-CM | POA: Diagnosis not present

## 2014-10-11 DIAGNOSIS — L97322 Non-pressure chronic ulcer of left ankle with fat layer exposed: Secondary | ICD-10-CM | POA: Diagnosis not present

## 2014-10-15 DIAGNOSIS — I872 Venous insufficiency (chronic) (peripheral): Secondary | ICD-10-CM | POA: Diagnosis not present

## 2014-10-15 DIAGNOSIS — L97322 Non-pressure chronic ulcer of left ankle with fat layer exposed: Secondary | ICD-10-CM | POA: Diagnosis not present

## 2014-10-16 DIAGNOSIS — I10 Essential (primary) hypertension: Secondary | ICD-10-CM | POA: Diagnosis not present

## 2014-10-16 DIAGNOSIS — M1990 Unspecified osteoarthritis, unspecified site: Secondary | ICD-10-CM | POA: Diagnosis not present

## 2014-10-16 DIAGNOSIS — L97322 Non-pressure chronic ulcer of left ankle with fat layer exposed: Secondary | ICD-10-CM | POA: Diagnosis not present

## 2014-10-16 DIAGNOSIS — D649 Anemia, unspecified: Secondary | ICD-10-CM | POA: Diagnosis not present

## 2014-10-16 DIAGNOSIS — I70243 Atherosclerosis of native arteries of left leg with ulceration of ankle: Secondary | ICD-10-CM | POA: Diagnosis not present

## 2014-10-22 DIAGNOSIS — I872 Venous insufficiency (chronic) (peripheral): Secondary | ICD-10-CM | POA: Diagnosis not present

## 2014-10-22 DIAGNOSIS — L97322 Non-pressure chronic ulcer of left ankle with fat layer exposed: Secondary | ICD-10-CM | POA: Diagnosis not present

## 2014-10-23 DIAGNOSIS — M1990 Unspecified osteoarthritis, unspecified site: Secondary | ICD-10-CM | POA: Diagnosis not present

## 2014-10-23 DIAGNOSIS — I70243 Atherosclerosis of native arteries of left leg with ulceration of ankle: Secondary | ICD-10-CM | POA: Diagnosis not present

## 2014-10-23 DIAGNOSIS — R0781 Pleurodynia: Secondary | ICD-10-CM | POA: Diagnosis not present

## 2014-10-23 DIAGNOSIS — Z23 Encounter for immunization: Secondary | ICD-10-CM | POA: Diagnosis not present

## 2014-10-23 DIAGNOSIS — I10 Essential (primary) hypertension: Secondary | ICD-10-CM | POA: Diagnosis not present

## 2014-10-23 DIAGNOSIS — D649 Anemia, unspecified: Secondary | ICD-10-CM | POA: Diagnosis not present

## 2014-10-23 DIAGNOSIS — L97322 Non-pressure chronic ulcer of left ankle with fat layer exposed: Secondary | ICD-10-CM | POA: Diagnosis not present

## 2014-10-23 DIAGNOSIS — L97321 Non-pressure chronic ulcer of left ankle limited to breakdown of skin: Secondary | ICD-10-CM | POA: Diagnosis not present

## 2014-10-23 DIAGNOSIS — Z6821 Body mass index (BMI) 21.0-21.9, adult: Secondary | ICD-10-CM | POA: Diagnosis not present

## 2014-10-29 DIAGNOSIS — I872 Venous insufficiency (chronic) (peripheral): Secondary | ICD-10-CM | POA: Diagnosis not present

## 2014-10-29 DIAGNOSIS — L97322 Non-pressure chronic ulcer of left ankle with fat layer exposed: Secondary | ICD-10-CM | POA: Diagnosis not present

## 2014-10-30 ENCOUNTER — Encounter (HOSPITAL_BASED_OUTPATIENT_CLINIC_OR_DEPARTMENT_OTHER): Payer: Medicare Other | Attending: Surgery

## 2014-10-30 DIAGNOSIS — M1990 Unspecified osteoarthritis, unspecified site: Secondary | ICD-10-CM | POA: Insufficient documentation

## 2014-10-30 DIAGNOSIS — I70243 Atherosclerosis of native arteries of left leg with ulceration of ankle: Secondary | ICD-10-CM | POA: Diagnosis not present

## 2014-10-30 DIAGNOSIS — L97321 Non-pressure chronic ulcer of left ankle limited to breakdown of skin: Secondary | ICD-10-CM | POA: Insufficient documentation

## 2014-10-30 DIAGNOSIS — Z9221 Personal history of antineoplastic chemotherapy: Secondary | ICD-10-CM | POA: Diagnosis not present

## 2014-10-30 DIAGNOSIS — I499 Cardiac arrhythmia, unspecified: Secondary | ICD-10-CM | POA: Diagnosis not present

## 2014-10-30 DIAGNOSIS — L97322 Non-pressure chronic ulcer of left ankle with fat layer exposed: Secondary | ICD-10-CM | POA: Diagnosis not present

## 2014-10-30 DIAGNOSIS — I10 Essential (primary) hypertension: Secondary | ICD-10-CM | POA: Insufficient documentation

## 2014-11-06 DIAGNOSIS — L97322 Non-pressure chronic ulcer of left ankle with fat layer exposed: Secondary | ICD-10-CM | POA: Diagnosis not present

## 2014-11-06 DIAGNOSIS — I70243 Atherosclerosis of native arteries of left leg with ulceration of ankle: Secondary | ICD-10-CM | POA: Diagnosis not present

## 2014-11-06 DIAGNOSIS — Z9221 Personal history of antineoplastic chemotherapy: Secondary | ICD-10-CM | POA: Diagnosis not present

## 2014-11-06 DIAGNOSIS — I10 Essential (primary) hypertension: Secondary | ICD-10-CM | POA: Diagnosis not present

## 2014-11-06 DIAGNOSIS — I499 Cardiac arrhythmia, unspecified: Secondary | ICD-10-CM | POA: Diagnosis not present

## 2014-11-06 DIAGNOSIS — L97321 Non-pressure chronic ulcer of left ankle limited to breakdown of skin: Secondary | ICD-10-CM | POA: Diagnosis not present

## 2014-11-06 DIAGNOSIS — M1990 Unspecified osteoarthritis, unspecified site: Secondary | ICD-10-CM | POA: Diagnosis not present

## 2014-11-08 DIAGNOSIS — I872 Venous insufficiency (chronic) (peripheral): Secondary | ICD-10-CM | POA: Diagnosis not present

## 2014-11-08 DIAGNOSIS — L97322 Non-pressure chronic ulcer of left ankle with fat layer exposed: Secondary | ICD-10-CM | POA: Diagnosis not present

## 2014-11-12 DIAGNOSIS — Z6821 Body mass index (BMI) 21.0-21.9, adult: Secondary | ICD-10-CM | POA: Diagnosis not present

## 2014-11-12 DIAGNOSIS — R0781 Pleurodynia: Secondary | ICD-10-CM | POA: Diagnosis not present

## 2014-11-12 DIAGNOSIS — I10 Essential (primary) hypertension: Secondary | ICD-10-CM | POA: Diagnosis not present

## 2014-11-12 DIAGNOSIS — L97909 Non-pressure chronic ulcer of unspecified part of unspecified lower leg with unspecified severity: Secondary | ICD-10-CM | POA: Diagnosis not present

## 2014-11-12 DIAGNOSIS — R7301 Impaired fasting glucose: Secondary | ICD-10-CM | POA: Diagnosis not present

## 2014-11-12 DIAGNOSIS — I4891 Unspecified atrial fibrillation: Secondary | ICD-10-CM | POA: Diagnosis not present

## 2014-11-12 DIAGNOSIS — E119 Type 2 diabetes mellitus without complications: Secondary | ICD-10-CM | POA: Diagnosis not present

## 2014-11-12 DIAGNOSIS — R2689 Other abnormalities of gait and mobility: Secondary | ICD-10-CM | POA: Diagnosis not present

## 2014-11-12 DIAGNOSIS — M810 Age-related osteoporosis without current pathological fracture: Secondary | ICD-10-CM | POA: Diagnosis not present

## 2014-11-13 DIAGNOSIS — I499 Cardiac arrhythmia, unspecified: Secondary | ICD-10-CM | POA: Diagnosis not present

## 2014-11-13 DIAGNOSIS — Z9221 Personal history of antineoplastic chemotherapy: Secondary | ICD-10-CM | POA: Diagnosis not present

## 2014-11-13 DIAGNOSIS — M1990 Unspecified osteoarthritis, unspecified site: Secondary | ICD-10-CM | POA: Diagnosis not present

## 2014-11-13 DIAGNOSIS — I10 Essential (primary) hypertension: Secondary | ICD-10-CM | POA: Diagnosis not present

## 2014-11-13 DIAGNOSIS — I70243 Atherosclerosis of native arteries of left leg with ulceration of ankle: Secondary | ICD-10-CM | POA: Diagnosis not present

## 2014-11-13 DIAGNOSIS — L97321 Non-pressure chronic ulcer of left ankle limited to breakdown of skin: Secondary | ICD-10-CM | POA: Diagnosis not present

## 2014-11-15 DIAGNOSIS — I872 Venous insufficiency (chronic) (peripheral): Secondary | ICD-10-CM | POA: Diagnosis not present

## 2014-11-15 DIAGNOSIS — L97322 Non-pressure chronic ulcer of left ankle with fat layer exposed: Secondary | ICD-10-CM | POA: Diagnosis not present

## 2014-11-18 ENCOUNTER — Ambulatory Visit (INDEPENDENT_AMBULATORY_CARE_PROVIDER_SITE_OTHER): Payer: Medicare Other | Admitting: *Deleted

## 2014-11-18 DIAGNOSIS — I495 Sick sinus syndrome: Secondary | ICD-10-CM

## 2014-11-18 NOTE — Progress Notes (Signed)
Remote pacemaker transmission.   

## 2014-11-19 DIAGNOSIS — L97322 Non-pressure chronic ulcer of left ankle with fat layer exposed: Secondary | ICD-10-CM | POA: Diagnosis not present

## 2014-11-19 DIAGNOSIS — I872 Venous insufficiency (chronic) (peripheral): Secondary | ICD-10-CM | POA: Diagnosis not present

## 2014-11-20 DIAGNOSIS — I10 Essential (primary) hypertension: Secondary | ICD-10-CM | POA: Diagnosis not present

## 2014-11-20 DIAGNOSIS — L97321 Non-pressure chronic ulcer of left ankle limited to breakdown of skin: Secondary | ICD-10-CM | POA: Diagnosis not present

## 2014-11-20 DIAGNOSIS — L97322 Non-pressure chronic ulcer of left ankle with fat layer exposed: Secondary | ICD-10-CM | POA: Diagnosis not present

## 2014-11-20 DIAGNOSIS — I499 Cardiac arrhythmia, unspecified: Secondary | ICD-10-CM | POA: Diagnosis not present

## 2014-11-20 DIAGNOSIS — I70243 Atherosclerosis of native arteries of left leg with ulceration of ankle: Secondary | ICD-10-CM | POA: Diagnosis not present

## 2014-11-20 DIAGNOSIS — Z9221 Personal history of antineoplastic chemotherapy: Secondary | ICD-10-CM | POA: Diagnosis not present

## 2014-11-20 DIAGNOSIS — M1990 Unspecified osteoarthritis, unspecified site: Secondary | ICD-10-CM | POA: Diagnosis not present

## 2014-11-21 ENCOUNTER — Encounter: Payer: Self-pay | Admitting: Cardiology

## 2014-11-21 LAB — CUP PACEART REMOTE DEVICE CHECK
Battery Remaining Percentage: 95.5 %
Battery Voltage: 3.02 V
Brady Statistic AS VP Percent: 1 %
Brady Statistic AS VS Percent: 35 %
Brady Statistic RA Percent Paced: 65 %
Implantable Lead Implant Date: 20160421
Implantable Lead Implant Date: 20160421
Implantable Lead Location: 753860
Lead Channel Impedance Value: 450 Ohm
Lead Channel Pacing Threshold Amplitude: 0.5 V
Lead Channel Pacing Threshold Pulse Width: 0.5 ms
Lead Channel Sensing Intrinsic Amplitude: 12 mV
Lead Channel Setting Pacing Amplitude: 2 V
Lead Channel Setting Pacing Amplitude: 2.5 V
Lead Channel Setting Pacing Pulse Width: 0.5 ms
MDC IDC LEAD LOCATION: 753859
MDC IDC MSMT BATTERY REMAINING LONGEVITY: 111 mo
MDC IDC MSMT LEADCHNL RA IMPEDANCE VALUE: 380 Ohm
MDC IDC MSMT LEADCHNL RA PACING THRESHOLD PULSEWIDTH: 0.5 ms
MDC IDC MSMT LEADCHNL RA SENSING INTR AMPL: 1.9 mV
MDC IDC MSMT LEADCHNL RV PACING THRESHOLD AMPLITUDE: 1 V
MDC IDC PG SERIAL: 7761558
MDC IDC SESS DTM: 20161024060013
MDC IDC SET LEADCHNL RV SENSING SENSITIVITY: 2 mV
MDC IDC STAT BRADY AP VP PERCENT: 1 %
MDC IDC STAT BRADY AP VS PERCENT: 65 %
MDC IDC STAT BRADY RV PERCENT PACED: 1 %
Pulse Gen Model: 2240

## 2014-11-22 DIAGNOSIS — I872 Venous insufficiency (chronic) (peripheral): Secondary | ICD-10-CM | POA: Diagnosis not present

## 2014-11-22 DIAGNOSIS — L97322 Non-pressure chronic ulcer of left ankle with fat layer exposed: Secondary | ICD-10-CM | POA: Diagnosis not present

## 2014-11-26 DIAGNOSIS — I872 Venous insufficiency (chronic) (peripheral): Secondary | ICD-10-CM | POA: Diagnosis not present

## 2014-11-26 DIAGNOSIS — L97322 Non-pressure chronic ulcer of left ankle with fat layer exposed: Secondary | ICD-10-CM | POA: Diagnosis not present

## 2014-11-27 ENCOUNTER — Encounter (HOSPITAL_BASED_OUTPATIENT_CLINIC_OR_DEPARTMENT_OTHER): Payer: Medicare Other | Attending: Surgery

## 2014-11-27 DIAGNOSIS — D649 Anemia, unspecified: Secondary | ICD-10-CM | POA: Insufficient documentation

## 2014-11-27 DIAGNOSIS — I70243 Atherosclerosis of native arteries of left leg with ulceration of ankle: Secondary | ICD-10-CM | POA: Insufficient documentation

## 2014-11-27 DIAGNOSIS — L97321 Non-pressure chronic ulcer of left ankle limited to breakdown of skin: Secondary | ICD-10-CM | POA: Insufficient documentation

## 2014-11-27 DIAGNOSIS — Z9221 Personal history of antineoplastic chemotherapy: Secondary | ICD-10-CM | POA: Insufficient documentation

## 2014-11-27 DIAGNOSIS — I10 Essential (primary) hypertension: Secondary | ICD-10-CM | POA: Insufficient documentation

## 2014-11-27 DIAGNOSIS — M1990 Unspecified osteoarthritis, unspecified site: Secondary | ICD-10-CM | POA: Insufficient documentation

## 2014-12-04 DIAGNOSIS — M1990 Unspecified osteoarthritis, unspecified site: Secondary | ICD-10-CM | POA: Diagnosis not present

## 2014-12-04 DIAGNOSIS — I70243 Atherosclerosis of native arteries of left leg with ulceration of ankle: Secondary | ICD-10-CM | POA: Diagnosis not present

## 2014-12-04 DIAGNOSIS — D649 Anemia, unspecified: Secondary | ICD-10-CM | POA: Diagnosis not present

## 2014-12-04 DIAGNOSIS — I10 Essential (primary) hypertension: Secondary | ICD-10-CM | POA: Diagnosis not present

## 2014-12-04 DIAGNOSIS — Z9221 Personal history of antineoplastic chemotherapy: Secondary | ICD-10-CM | POA: Diagnosis not present

## 2014-12-04 DIAGNOSIS — L97322 Non-pressure chronic ulcer of left ankle with fat layer exposed: Secondary | ICD-10-CM | POA: Diagnosis not present

## 2014-12-04 DIAGNOSIS — L97321 Non-pressure chronic ulcer of left ankle limited to breakdown of skin: Secondary | ICD-10-CM | POA: Diagnosis not present

## 2014-12-06 DIAGNOSIS — I872 Venous insufficiency (chronic) (peripheral): Secondary | ICD-10-CM | POA: Diagnosis not present

## 2014-12-06 DIAGNOSIS — L97322 Non-pressure chronic ulcer of left ankle with fat layer exposed: Secondary | ICD-10-CM | POA: Diagnosis not present

## 2014-12-09 ENCOUNTER — Encounter (HOSPITAL_COMMUNITY): Payer: Self-pay

## 2014-12-09 ENCOUNTER — Ambulatory Visit (HOSPITAL_COMMUNITY)
Admission: RE | Admit: 2014-12-09 | Discharge: 2014-12-09 | Disposition: A | Discharge status: Home / Self Care | Payer: Medicare Other | Source: Ambulatory Visit | Attending: Internal Medicine | Admitting: Internal Medicine

## 2014-12-09 DIAGNOSIS — M810 Age-related osteoporosis without current pathological fracture: Secondary | ICD-10-CM | POA: Insufficient documentation

## 2014-12-09 MED ORDER — DENOSUMAB 60 MG/ML ~~LOC~~ SOLN
60.0000 mg | Freq: Once | SUBCUTANEOUS | Status: AC
  Administered 2014-12-09: 60 mg via SUBCUTANEOUS
  Filled 2014-12-09: qty 1

## 2014-12-09 NOTE — Discharge Instructions (Signed)
PROLIA °Denosumab injection °What is this medicine? °DENOSUMAB (den oh sue mab) slows bone breakdown. Prolia is used to treat osteoporosis in women after menopause and in men. Xgeva is used to prevent bone fractures and other bone problems caused by cancer bone metastases. Xgeva is also used to treat giant cell tumor of the bone. °This medicine may be used for other purposes; ask your health care provider or pharmacist if you have questions. °What should I tell my health care provider before I take this medicine? °They need to know if you have any of these conditions: °-dental disease °-eczema °-infection or history of infections °-kidney disease or on dialysis °-low blood calcium or vitamin D °-malabsorption syndrome °-scheduled to have surgery or tooth extraction °-taking medicine that contains denosumab °-thyroid or parathyroid disease °-an unusual reaction to denosumab, other medicines, foods, dyes, or preservatives °-pregnant or trying to get pregnant °-breast-feeding °How should I use this medicine? °This medicine is for injection under the skin. It is given by a health care professional in a hospital or clinic setting. °If you are getting Prolia, a special MedGuide will be given to you by the pharmacist with each prescription and refill. Be sure to read this information carefully each time. °For Prolia, talk to your pediatrician regarding the use of this medicine in children. Special care may be needed. For Xgeva, talk to your pediatrician regarding the use of this medicine in children. While this drug may be prescribed for children as young as 13 years for selected conditions, precautions do apply. °Overdosage: If you think you have taken too much of this medicine contact a poison control center or emergency room at once. °NOTE: This medicine is only for you. Do not share this medicine with others. °What if I miss a dose? °It is important not to miss your dose. Call your doctor or health care professional if  you are unable to keep an appointment. °What may interact with this medicine? °Do not take this medicine with any of the following medications: °-other medicines containing denosumab °This medicine may also interact with the following medications: °-medicines that suppress the immune system °-medicines that treat cancer °-steroid medicines like prednisone or cortisone °This list may not describe all possible interactions. Give your health care provider a list of all the medicines, herbs, non-prescription drugs, or dietary supplements you use. Also tell them if you smoke, drink alcohol, or use illegal drugs. Some items may interact with your medicine. °What should I watch for while using this medicine? °Visit your doctor or health care professional for regular checks on your progress. Your doctor or health care professional may order blood tests and other tests to see how you are doing. °Call your doctor or health care professional if you get a cold or other infection while receiving this medicine. Do not treat yourself. This medicine may decrease your body's ability to fight infection. °You should make sure you get enough calcium and vitamin D while you are taking this medicine, unless your doctor tells you not to. Discuss the foods you eat and the vitamins you take with your health care professional. °See your dentist regularly. Brush and floss your teeth as directed. Before you have any dental work done, tell your dentist you are receiving this medicine. °Do not become pregnant while taking this medicine or for 5 months after stopping it. Women should inform their doctor if they wish to become pregnant or think they might be pregnant. There is a potential for serious side   effects to an unborn child. Talk to your health care professional or pharmacist for more information. °What side effects may I notice from receiving this medicine? °Side effects that you should report to your doctor or health care professional as  soon as possible: °-allergic reactions like skin rash, itching or hives, swelling of the face, lips, or tongue °-breathing problems °-chest pain °-fast, irregular heartbeat °-feeling faint or lightheaded, falls °-fever, chills, or any other sign of infection °-muscle spasms, tightening, or twitches °-numbness or tingling °-skin blisters or bumps, or is dry, peels, or red °-slow healing or unexplained pain in the mouth or jaw °-unusual bleeding or bruising °Side effects that usually do not require medical attention (Report these to your doctor or health care professional if they continue or are bothersome.): °-muscle pain °-stomach upset, gas °This list may not describe all possible side effects. Call your doctor for medical advice about side effects. You may report side effects to FDA at 1-800-FDA-1088. °Where should I keep my medicine? °This medicine is only given in a clinic, doctor's office, or other health care setting and will not be stored at home. °NOTE: This sheet is a summary. It may not cover all possible information. If you have questions about this medicine, talk to your doctor, pharmacist, or health care provider. °  °© 2016, Elsevier/Gold Standard. (2011-07-12 12:37:47) °Osteoporosis °Osteoporosis is the thinning and loss of density in the bones. Osteoporosis makes the bones more brittle, fragile, and likely to break (fracture). Over time, osteoporosis can cause the bones to become so weak that they fracture after a simple fall. The bones most likely to fracture are the bones in the hip, wrist, and spine. °CAUSES  °The exact cause is not known. °RISK FACTORS °Anyone can develop osteoporosis. You may be at greater risk if you have a family history of the condition or have poor nutrition. You may also have a higher risk if you are:  °· Female.   °· 50 years old or older. °· A smoker. °· Not physically active.   °· White or Asian. °· Slender. °SIGNS AND SYMPTOMS  °A fracture might be the first sign of the  disease, especially if it results from a fall or injury that would not usually cause a bone to break. Other signs and symptoms include:  °· Low back and neck pain. °· Stooped posture. °· Height loss. °DIAGNOSIS  °To make a diagnosis, your health care provider may: °· Take a medical history. °· Perform a physical exam. °· Order tests, such as: °¨ A bone mineral density test. °¨ A dual-energy X-ray absorptiometry test. °TREATMENT  °The goal of osteoporosis treatment is to strengthen your bones to reduce your risk of a fracture. Treatment may involve: °· Making lifestyle changes, such as: °¨ Eating a diet rich in calcium. °¨ Doing weight-bearing and muscle-strengthening exercises. °¨ Stopping tobacco use. °¨ Limiting alcohol intake. °· Taking medicine to slow the process of bone loss or to increase bone density. °· Monitoring your levels of calcium and vitamin D. °HOME CARE INSTRUCTIONS °· Include calcium and vitamin D in your diet. Calcium is important for bone health, and vitamin D helps the body absorb calcium. °· Perform weight-bearing and muscle-strengthening exercises as directed by your health care provider. °· Do not use any tobacco products, including cigarettes, chewing tobacco, and electronic cigarettes. If you need help quitting, ask your health care provider. °· Limit your alcohol intake. °· Take medicines only as directed by your health care provider. °· Keep all   follow-up visits as directed by your health care provider. This is important. °· Take precautions at home to lower your risk of falling, such as: °¨ Keeping rooms well lit and clutter free. °¨ Installing safety rails on stairs. °¨ Using rubber mats in the bathroom and other areas that are often wet or slippery. °SEEK IMMEDIATE MEDICAL CARE IF:  °You fall or injure yourself.  °  °This information is not intended to replace advice given to you by your health care provider. Make sure you discuss any questions you have with your health care  provider. °  °Document Released: 10/21/2004 Document Revised: 02/01/2014 Document Reviewed: 06/21/2013 °Elsevier Interactive Patient Education ©2016 Elsevier Inc. ° ° °

## 2014-12-11 DIAGNOSIS — I70243 Atherosclerosis of native arteries of left leg with ulceration of ankle: Secondary | ICD-10-CM | POA: Diagnosis not present

## 2014-12-11 DIAGNOSIS — Z9221 Personal history of antineoplastic chemotherapy: Secondary | ICD-10-CM | POA: Diagnosis not present

## 2014-12-11 DIAGNOSIS — L97321 Non-pressure chronic ulcer of left ankle limited to breakdown of skin: Secondary | ICD-10-CM | POA: Diagnosis not present

## 2014-12-11 DIAGNOSIS — I10 Essential (primary) hypertension: Secondary | ICD-10-CM | POA: Diagnosis not present

## 2014-12-11 DIAGNOSIS — D649 Anemia, unspecified: Secondary | ICD-10-CM | POA: Diagnosis not present

## 2014-12-11 DIAGNOSIS — M1990 Unspecified osteoarthritis, unspecified site: Secondary | ICD-10-CM | POA: Diagnosis not present

## 2014-12-13 DIAGNOSIS — I872 Venous insufficiency (chronic) (peripheral): Secondary | ICD-10-CM | POA: Diagnosis not present

## 2014-12-13 DIAGNOSIS — L97322 Non-pressure chronic ulcer of left ankle with fat layer exposed: Secondary | ICD-10-CM | POA: Diagnosis not present

## 2014-12-17 DIAGNOSIS — L97322 Non-pressure chronic ulcer of left ankle with fat layer exposed: Secondary | ICD-10-CM | POA: Diagnosis not present

## 2014-12-17 DIAGNOSIS — I872 Venous insufficiency (chronic) (peripheral): Secondary | ICD-10-CM | POA: Diagnosis not present

## 2014-12-18 DIAGNOSIS — D649 Anemia, unspecified: Secondary | ICD-10-CM | POA: Diagnosis not present

## 2014-12-18 DIAGNOSIS — L97321 Non-pressure chronic ulcer of left ankle limited to breakdown of skin: Secondary | ICD-10-CM | POA: Diagnosis not present

## 2014-12-18 DIAGNOSIS — Z9221 Personal history of antineoplastic chemotherapy: Secondary | ICD-10-CM | POA: Diagnosis not present

## 2014-12-18 DIAGNOSIS — M1990 Unspecified osteoarthritis, unspecified site: Secondary | ICD-10-CM | POA: Diagnosis not present

## 2014-12-18 DIAGNOSIS — L97322 Non-pressure chronic ulcer of left ankle with fat layer exposed: Secondary | ICD-10-CM | POA: Diagnosis not present

## 2014-12-18 DIAGNOSIS — I70243 Atherosclerosis of native arteries of left leg with ulceration of ankle: Secondary | ICD-10-CM | POA: Diagnosis not present

## 2014-12-18 DIAGNOSIS — I10 Essential (primary) hypertension: Secondary | ICD-10-CM | POA: Diagnosis not present

## 2014-12-23 DIAGNOSIS — I872 Venous insufficiency (chronic) (peripheral): Secondary | ICD-10-CM | POA: Diagnosis not present

## 2014-12-23 DIAGNOSIS — L97322 Non-pressure chronic ulcer of left ankle with fat layer exposed: Secondary | ICD-10-CM | POA: Diagnosis not present

## 2014-12-25 DIAGNOSIS — D649 Anemia, unspecified: Secondary | ICD-10-CM | POA: Diagnosis not present

## 2014-12-25 DIAGNOSIS — I10 Essential (primary) hypertension: Secondary | ICD-10-CM | POA: Diagnosis not present

## 2014-12-25 DIAGNOSIS — Z9221 Personal history of antineoplastic chemotherapy: Secondary | ICD-10-CM | POA: Diagnosis not present

## 2014-12-25 DIAGNOSIS — L97322 Non-pressure chronic ulcer of left ankle with fat layer exposed: Secondary | ICD-10-CM | POA: Diagnosis not present

## 2014-12-25 DIAGNOSIS — M1990 Unspecified osteoarthritis, unspecified site: Secondary | ICD-10-CM | POA: Diagnosis not present

## 2014-12-25 DIAGNOSIS — L97321 Non-pressure chronic ulcer of left ankle limited to breakdown of skin: Secondary | ICD-10-CM | POA: Diagnosis not present

## 2014-12-25 DIAGNOSIS — I70243 Atherosclerosis of native arteries of left leg with ulceration of ankle: Secondary | ICD-10-CM | POA: Diagnosis not present

## 2014-12-30 DIAGNOSIS — L97322 Non-pressure chronic ulcer of left ankle with fat layer exposed: Secondary | ICD-10-CM | POA: Diagnosis not present

## 2014-12-30 DIAGNOSIS — I872 Venous insufficiency (chronic) (peripheral): Secondary | ICD-10-CM | POA: Diagnosis not present

## 2015-01-01 ENCOUNTER — Encounter (HOSPITAL_BASED_OUTPATIENT_CLINIC_OR_DEPARTMENT_OTHER): Payer: Medicare Other | Attending: Surgery

## 2015-01-01 DIAGNOSIS — M1990 Unspecified osteoarthritis, unspecified site: Secondary | ICD-10-CM | POA: Insufficient documentation

## 2015-01-01 DIAGNOSIS — Z9221 Personal history of antineoplastic chemotherapy: Secondary | ICD-10-CM | POA: Diagnosis not present

## 2015-01-01 DIAGNOSIS — I70243 Atherosclerosis of native arteries of left leg with ulceration of ankle: Secondary | ICD-10-CM | POA: Diagnosis not present

## 2015-01-01 DIAGNOSIS — L97321 Non-pressure chronic ulcer of left ankle limited to breakdown of skin: Secondary | ICD-10-CM | POA: Diagnosis not present

## 2015-01-01 DIAGNOSIS — I10 Essential (primary) hypertension: Secondary | ICD-10-CM | POA: Diagnosis not present

## 2015-01-08 DIAGNOSIS — L97322 Non-pressure chronic ulcer of left ankle with fat layer exposed: Secondary | ICD-10-CM | POA: Diagnosis not present

## 2015-01-08 DIAGNOSIS — I872 Venous insufficiency (chronic) (peripheral): Secondary | ICD-10-CM | POA: Diagnosis not present

## 2015-01-15 DIAGNOSIS — M1990 Unspecified osteoarthritis, unspecified site: Secondary | ICD-10-CM | POA: Diagnosis not present

## 2015-01-15 DIAGNOSIS — L97321 Non-pressure chronic ulcer of left ankle limited to breakdown of skin: Secondary | ICD-10-CM | POA: Diagnosis not present

## 2015-01-15 DIAGNOSIS — Z9221 Personal history of antineoplastic chemotherapy: Secondary | ICD-10-CM | POA: Diagnosis not present

## 2015-01-15 DIAGNOSIS — I70243 Atherosclerosis of native arteries of left leg with ulceration of ankle: Secondary | ICD-10-CM | POA: Diagnosis not present

## 2015-01-15 DIAGNOSIS — I10 Essential (primary) hypertension: Secondary | ICD-10-CM | POA: Diagnosis not present

## 2015-01-16 DIAGNOSIS — L97322 Non-pressure chronic ulcer of left ankle with fat layer exposed: Secondary | ICD-10-CM | POA: Diagnosis not present

## 2015-01-16 DIAGNOSIS — I872 Venous insufficiency (chronic) (peripheral): Secondary | ICD-10-CM | POA: Diagnosis not present

## 2015-02-10 ENCOUNTER — Other Ambulatory Visit: Payer: Self-pay | Admitting: Cardiovascular Disease

## 2015-02-17 ENCOUNTER — Ambulatory Visit (INDEPENDENT_AMBULATORY_CARE_PROVIDER_SITE_OTHER): Payer: Medicare Other | Admitting: *Deleted

## 2015-02-17 DIAGNOSIS — I495 Sick sinus syndrome: Secondary | ICD-10-CM

## 2015-02-18 DIAGNOSIS — E559 Vitamin D deficiency, unspecified: Secondary | ICD-10-CM | POA: Diagnosis not present

## 2015-02-18 DIAGNOSIS — N390 Urinary tract infection, site not specified: Secondary | ICD-10-CM | POA: Diagnosis not present

## 2015-02-18 DIAGNOSIS — E538 Deficiency of other specified B group vitamins: Secondary | ICD-10-CM | POA: Diagnosis not present

## 2015-02-18 DIAGNOSIS — D649 Anemia, unspecified: Secondary | ICD-10-CM | POA: Diagnosis not present

## 2015-02-18 DIAGNOSIS — N182 Chronic kidney disease, stage 2 (mild): Secondary | ICD-10-CM | POA: Diagnosis not present

## 2015-02-18 DIAGNOSIS — R8299 Other abnormal findings in urine: Secondary | ICD-10-CM | POA: Diagnosis not present

## 2015-02-18 DIAGNOSIS — E1129 Type 2 diabetes mellitus with other diabetic kidney complication: Secondary | ICD-10-CM | POA: Diagnosis not present

## 2015-02-18 DIAGNOSIS — E784 Other hyperlipidemia: Secondary | ICD-10-CM | POA: Diagnosis not present

## 2015-02-18 NOTE — Progress Notes (Signed)
Remote pacemaker transmission.   

## 2015-02-20 LAB — CUP PACEART REMOTE DEVICE CHECK
Battery Remaining Longevity: 110 mo
Battery Remaining Percentage: 95.5 %
Battery Voltage: 3.02 V
Brady Statistic AS VS Percent: 36 %
Date Time Interrogation Session: 20170124061540
Implantable Lead Implant Date: 20160421
Implantable Lead Location: 753860
Lead Channel Sensing Intrinsic Amplitude: 12 mV
Lead Channel Sensing Intrinsic Amplitude: 2.1 mV
Lead Channel Setting Pacing Amplitude: 2.5 V
Lead Channel Setting Pacing Pulse Width: 0.5 ms
Lead Channel Setting Sensing Sensitivity: 2 mV
MDC IDC LEAD IMPLANT DT: 20160421
MDC IDC LEAD LOCATION: 753859
MDC IDC MSMT LEADCHNL RA IMPEDANCE VALUE: 390 Ohm
MDC IDC MSMT LEADCHNL RV IMPEDANCE VALUE: 450 Ohm
MDC IDC SET LEADCHNL RA PACING AMPLITUDE: 2 V
MDC IDC STAT BRADY AP VP PERCENT: 1 %
MDC IDC STAT BRADY AP VS PERCENT: 64 %
MDC IDC STAT BRADY AS VP PERCENT: 1 %
MDC IDC STAT BRADY RA PERCENT PACED: 64 %
MDC IDC STAT BRADY RV PERCENT PACED: 1 %
Pulse Gen Model: 2240
Pulse Gen Serial Number: 7761558

## 2015-02-26 ENCOUNTER — Encounter: Payer: Self-pay | Admitting: Cardiology

## 2015-02-26 DIAGNOSIS — L97529 Non-pressure chronic ulcer of other part of left foot with unspecified severity: Secondary | ICD-10-CM | POA: Diagnosis not present

## 2015-02-26 DIAGNOSIS — Z0000 Encounter for general adult medical examination without abnormal findings: Secondary | ICD-10-CM | POA: Diagnosis not present

## 2015-02-26 DIAGNOSIS — R946 Abnormal results of thyroid function studies: Secondary | ICD-10-CM | POA: Diagnosis not present

## 2015-02-26 DIAGNOSIS — Z1389 Encounter for screening for other disorder: Secondary | ICD-10-CM | POA: Diagnosis not present

## 2015-02-26 DIAGNOSIS — D6489 Other specified anemias: Secondary | ICD-10-CM | POA: Diagnosis not present

## 2015-02-26 DIAGNOSIS — J9383 Other pneumothorax: Secondary | ICD-10-CM | POA: Diagnosis not present

## 2015-02-26 DIAGNOSIS — Z6821 Body mass index (BMI) 21.0-21.9, adult: Secondary | ICD-10-CM | POA: Diagnosis not present

## 2015-02-26 DIAGNOSIS — R2689 Other abnormalities of gait and mobility: Secondary | ICD-10-CM | POA: Diagnosis not present

## 2015-02-26 DIAGNOSIS — R601 Generalized edema: Secondary | ICD-10-CM | POA: Diagnosis not present

## 2015-02-26 DIAGNOSIS — N182 Chronic kidney disease, stage 2 (mild): Secondary | ICD-10-CM | POA: Diagnosis not present

## 2015-02-26 DIAGNOSIS — I4891 Unspecified atrial fibrillation: Secondary | ICD-10-CM | POA: Diagnosis not present

## 2015-02-26 DIAGNOSIS — N390 Urinary tract infection, site not specified: Secondary | ICD-10-CM | POA: Diagnosis not present

## 2015-05-05 ENCOUNTER — Ambulatory Visit (INDEPENDENT_AMBULATORY_CARE_PROVIDER_SITE_OTHER): Payer: Medicare Other | Admitting: Internal Medicine

## 2015-05-05 ENCOUNTER — Encounter: Payer: Self-pay | Admitting: Internal Medicine

## 2015-05-05 VITALS — BP 148/72 | HR 63 | Ht 64.0 in | Wt 124.4 lb

## 2015-05-05 DIAGNOSIS — I495 Sick sinus syndrome: Secondary | ICD-10-CM | POA: Diagnosis not present

## 2015-05-05 DIAGNOSIS — I480 Paroxysmal atrial fibrillation: Secondary | ICD-10-CM | POA: Diagnosis not present

## 2015-05-05 NOTE — Progress Notes (Signed)
HPI [name] [name] returns today for followup. She is a pleasant 82 yo woman with symptomatic bradycardia, PAF, HTN, who underwent PPM insertion 12 months ago. She returns today for followup. She has no chest pain or sob. No syncope. She has had an ulcer on her foot which has finally resolved.   No Known Allergies   Current Outpatient Prescriptions  Medication Sig Dispense Refill  . acetaminophen (TYLENOL) 500 MG tablet Take 500 mg by mouth every 6 (six) hours as needed for mild pain.    Marland Kitchen atorvastatin (LIPITOR) 20 MG tablet Take 1 tablet (20 mg total) by mouth at bedtime. 30 tablet 11  . calcium-vitamin D (OSCAL WITH D) 500-200 MG-UNIT per tablet Take 1 tablet by mouth daily with breakfast.    . Collagenase (SANTYL EX) Apply 1 application topically daily.    . Cyanocobalamin (NASCOBAL NA) Place 1 spray into both nostrils once a week. saturday    . denosumab (PROLIA) 60 MG/ML SOLN injection Inject 60 mg into the skin every 6 (six) months. Administer in upper arm, thigh, or abdomen    . diltiazem (CARDIZEM CD) 120 MG 24 hr capsule Take 1 capsule (120 mg total) by mouth daily. 30 capsule 11  . ELIQUIS 2.5 MG TABS tablet TAKE 1 TABLET BY MOUTH TWICE A DAY 60 tablet 4  . ferrous sulfate (SLOW FE) 160 (50 FE) MG TBCR SR tablet Take 160 mg by mouth daily.    . hydrochlorothiazide (HYDRODIURIL) 25 MG tablet Take 12.5 mg by mouth daily.    Marland Kitchen KLOR-CON 10 10 MEQ tablet Take 10 mEq by mouth daily.  3  . LORazepam (ATIVAN) 1 MG tablet Take 1 mg by mouth at bedtime.    . metoprolol (LOPRESSOR) 50 MG tablet Take 50 mg by mouth 2 (two) times daily.    . mometasone (NASONEX) 50 MCG/ACT nasal spray Place 2 sprays into both nostrils daily.    . Multiple Vitamins-Minerals (MULTIVITAMIN WITH MINERALS) tablet Take 1 tablet by mouth daily.    Marland Kitchen NASCOBAL 500 MCG/0.1ML SOLN Place 500 mcg into both nostrils as directed.  3  . Omega-3 Fatty Acids (FISH OIL) 1000 MG CPDR Take 1,000 mg by mouth daily.    Vladimir Faster Glycol-Propyl Glycol 0.4-0.3 % SOLN Apply 1 drop to eye daily as needed (dryness).    . potassium chloride (K-DUR,KLOR-CON) 10 MEQ tablet Take 10 mEq by mouth daily.    . sucralfate (CARAFATE) 1 G tablet Take 1 g by mouth 4 (four) times daily -  before meals and at bedtime.    . traMADol (ULTRAM) 50 MG tablet Take 50-100 mg by mouth 3 (three) times daily as needed (pain).    . Vitamin D, Ergocalciferol, (DRISDOL) 50000 UNITS CAPS capsule Take 1 capsule by mouth once a week.  2   No current facility-administered medications for this visit.     Past Medical History  Diagnosis Date  . Hypertension   . Stomach cancer ([place])     1992  . Atrial fibrillation ([place])     a. on Eliquis  . Tachy-brady syndrome ([place])     a. s/p STJ dual chamber PPM implant    ROS:   All systems reviewed and negative except as noted in the HPI.   Past Surgical History  Procedure Laterality Date  . Partial gastrectomy  1992  . Cholecystectomy    . Back surgery    . Total abdominal hysterectomy    . Permanent pacemaker  insertion N/A 05/16/2014    STJ Assurity dual chamber pacemaker implanted by Dr Lovena Le     Family History  Problem Relation Age of Onset  . Heart disease Father     Dropsy  . Pneumonia Mother   . Sudden death Daughter 65  . Heart attack Son 84     Social History   Social History  . Marital Status: Married    Spouse Name: N/A  . Number of Children: 4  . Years of Education: N/A   Occupational History  . Not on file.   Social History Main Topics  . Smoking status: Never Smoker   . Smokeless tobacco: Not on file  . Alcohol Use: No  . Drug Use: Not on file  . Sexual Activity: Not on file   Other Topics Concern  . Not on file   Social History Narrative   Lives alone.       BP 148/72 mmHg  Pulse 63  Ht 5\' 4"  (1.626 m)  Wt 124 lb 6.4 oz (56.427 kg)  BMI 21.34 kg/m2  SpO2 99%  Physical Exam:  Well appearing elderly woman, NAD HEENT: Unremarkable Neck:   6 cm JVD, no thyromegally Back:  No CVA tenderness Lungs:  Clear with no wheezes HEART:  Regular rate rhythm, no murmurs, no rubs, no clicks Abd:  soft, positive bowel sounds, no organomegally, no rebound, no guarding Ext:  2 plus pulses, no edema, no cyanosis, no clubbing Skin:  No rashes no nodules Neuro:  CN II through XII intact, motor grossly intact  DEVICE  Normal device function.  See PaceArt for details.   Assess/Plan: 1. Symptomatic sinus node dysfunction - she is maintaining NSR. She is asymptomatic after her PPM was placed. 2. HTN - her blood pressure is a bit up today. Will follow. 3. PAF - she is tolerating low dose Eliquis nicely and she will continue. She is mostly in NSR and has had minimal palpitations. 4. Coags - continue Eliquis, low dose. No bleeding.  Mikle Bosworth.D.

## 2015-05-05 NOTE — Patient Instructions (Signed)
Medication Instructions:  Your physician recommends that you continue on your current medications as directed. Please refer to the Current Medication list given to you today.   Labwork: None ordered   Testing/Procedures: None ordered   Follow-Up: Your physician wants you to follow-up in: 12 months with Dr Taylor You will receive a reminder letter in the mail two months in advance. If you don't receive a letter, please call our office to schedule the follow-up appointment.   Remote monitoring is used to monitor your Pacemaker  from home. This monitoring reduces the number of office visits required to check your device to one time per year. It allows us to keep an eye on the functioning of your device to ensure it is working properly. You are scheduled for a device check from home on 08/04/15. You may send your transmission at any time that day. If you have a wireless device, the transmission will be sent automatically. After your physician reviews your transmission, you will receive a postcard with your next transmission date.    Any Other Special Instructions Will Be Listed Below (If Applicable).     If you need a refill on your cardiac medications before your next appointment, please call your pharmacy.   

## 2015-05-12 ENCOUNTER — Other Ambulatory Visit: Payer: Self-pay | Admitting: Physician Assistant

## 2015-05-13 NOTE — Telephone Encounter (Signed)
Ok to fill 

## 2015-05-13 NOTE — Telephone Encounter (Signed)
Ok to reorder under Dr Lovena Le?

## 2015-06-02 ENCOUNTER — Other Ambulatory Visit: Payer: Self-pay | Admitting: Physician Assistant

## 2015-06-02 DIAGNOSIS — D3131 Benign neoplasm of right choroid: Secondary | ICD-10-CM | POA: Diagnosis not present

## 2015-06-02 DIAGNOSIS — H353131 Nonexudative age-related macular degeneration, bilateral, early dry stage: Secondary | ICD-10-CM | POA: Diagnosis not present

## 2015-06-02 DIAGNOSIS — Z9849 Cataract extraction status, unspecified eye: Secondary | ICD-10-CM | POA: Diagnosis not present

## 2015-06-02 DIAGNOSIS — Z961 Presence of intraocular lens: Secondary | ICD-10-CM | POA: Diagnosis not present

## 2015-06-09 ENCOUNTER — Ambulatory Visit (HOSPITAL_COMMUNITY)
Admission: RE | Admit: 2015-06-09 | Discharge: 2015-06-09 | Disposition: A | Discharge status: Home / Self Care | Payer: Medicare Other | Source: Ambulatory Visit | Attending: Internal Medicine | Admitting: Internal Medicine

## 2015-06-09 ENCOUNTER — Encounter (HOSPITAL_COMMUNITY): Payer: Self-pay

## 2015-06-09 DIAGNOSIS — M810 Age-related osteoporosis without current pathological fracture: Secondary | ICD-10-CM | POA: Insufficient documentation

## 2015-06-09 MED ORDER — DENOSUMAB 60 MG/ML ~~LOC~~ SOLN
60.0000 mg | Freq: Once | SUBCUTANEOUS | Status: DC
  Filled 2015-06-09: qty 1

## 2015-06-09 NOTE — Discharge Instructions (Signed)
Denosumab injection  What is this medicine?  DENOSUMAB (den oh sue mab) slows bone breakdown. Prolia is used to treat osteoporosis in women after menopause and in men. Xgeva is used to prevent bone fractures and other bone problems caused by cancer bone metastases. Xgeva is also used to treat giant cell tumor of the bone.  This medicine may be used for other purposes; ask your health care provider or pharmacist if you have questions.  What should I tell my health care provider before I take this medicine?  They need to know if you have any of these conditions:  -dental disease  -eczema  -infection or history of infections  -kidney disease or on dialysis  -low blood calcium or vitamin D  -malabsorption syndrome  -scheduled to have surgery or tooth extraction  -taking medicine that contains denosumab  -thyroid or parathyroid disease  -an unusual reaction to denosumab, other medicines, foods, dyes, or preservatives  -pregnant or trying to get pregnant  -breast-feeding  How should I use this medicine?  This medicine is for injection under the skin. It is given by a health care professional in a hospital or clinic setting.  If you are getting Prolia, a special MedGuide will be given to you by the pharmacist with each prescription and refill. Be sure to read this information carefully each time.  For Prolia, talk to your pediatrician regarding the use of this medicine in children. Special care may be needed. For Xgeva, talk to your pediatrician regarding the use of this medicine in children. While this drug may be prescribed for children as young as 13 years for selected conditions, precautions do apply.  Overdosage: If you think you have taken too much of this medicine contact a poison control center or emergency room at once.  NOTE: This medicine is only for you. Do not share this medicine with others.  What if I miss a dose?  It is important not to miss your dose. Call your doctor or health care professional if you are  unable to keep an appointment.  What may interact with this medicine?  Do not take this medicine with any of the following medications:  -other medicines containing denosumab  This medicine may also interact with the following medications:  -medicines that suppress the immune system  -medicines that treat cancer  -steroid medicines like prednisone or cortisone  This list may not describe all possible interactions. Give your health care provider a list of all the medicines, herbs, non-prescription drugs, or dietary supplements you use. Also tell them if you smoke, drink alcohol, or use illegal drugs. Some items may interact with your medicine.  What should I watch for while using this medicine?  Visit your doctor or health care professional for regular checks on your progress. Your doctor or health care professional may order blood tests and other tests to see how you are doing.  Call your doctor or health care professional if you get a cold or other infection while receiving this medicine. Do not treat yourself. This medicine may decrease your body's ability to fight infection.  You should make sure you get enough calcium and vitamin D while you are taking this medicine, unless your doctor tells you not to. Discuss the foods you eat and the vitamins you take with your health care professional.  See your dentist regularly. Brush and floss your teeth as directed. Before you have any dental work done, tell your dentist you are receiving this medicine.  Do   not become pregnant while taking this medicine or for 5 months after stopping it. Women should inform their doctor if they wish to become pregnant or think they might be pregnant. There is a potential for serious side effects to an unborn child. Talk to your health care professional or pharmacist for more information.  What side effects may I notice from receiving this medicine?  Side effects that you should report to your doctor or health care professional as soon as  possible:  -allergic reactions like skin rash, itching or hives, swelling of the face, lips, or tongue  -breathing problems  -chest pain  -fast, irregular heartbeat  -feeling faint or lightheaded, falls  -fever, chills, or any other sign of infection  -muscle spasms, tightening, or twitches  -numbness or tingling  -skin blisters or bumps, or is dry, peels, or red  -slow healing or unexplained pain in the mouth or jaw  -unusual bleeding or bruising  Side effects that usually do not require medical attention (Report these to your doctor or health care professional if they continue or are bothersome.):  -muscle pain  -stomach upset, gas  This list may not describe all possible side effects. Call your doctor for medical advice about side effects. You may report side effects to FDA at 1-800-FDA-1088.  Where should I keep my medicine?  This medicine is only given in a clinic, doctor's office, or other health care setting and will not be stored at home.  NOTE: This sheet is a summary. It may not cover all possible information. If you have questions about this medicine, talk to your doctor, pharmacist, or health care provider.      2016, [employer]vier/[employer]. (2011-07-12 12:37:47)

## 2015-06-20 LAB — CUP PACEART INCLINIC DEVICE CHECK
Battery Voltage: 3.02 V
Brady Statistic RA Percent Paced: 67 %
Implantable Lead Implant Date: 20160421
Implantable Lead Location: 753859
Lead Channel Impedance Value: 400 Ohm
Lead Channel Impedance Value: 512.5 Ohm
Lead Channel Pacing Threshold Pulse Width: 0.5 ms
Lead Channel Pacing Threshold Pulse Width: 0.5 ms
Lead Channel Sensing Intrinsic Amplitude: 12 mV
Lead Channel Sensing Intrinsic Amplitude: 2.1 mV
Lead Channel Setting Pacing Amplitude: 2 V
Lead Channel Setting Pacing Pulse Width: 0.5 ms
MDC IDC LEAD IMPLANT DT: 20160421
MDC IDC LEAD LOCATION: 753860
MDC IDC MSMT BATTERY REMAINING LONGEVITY: 118.8
MDC IDC MSMT LEADCHNL RA PACING THRESHOLD AMPLITUDE: 0.75 V
MDC IDC MSMT LEADCHNL RV PACING THRESHOLD AMPLITUDE: 1 V
MDC IDC SESS DTM: 20170410152807
MDC IDC SET LEADCHNL RV PACING AMPLITUDE: 2.5 V
MDC IDC SET LEADCHNL RV SENSING SENSITIVITY: 2 mV
MDC IDC STAT BRADY RV PERCENT PACED: 0.02 %
Pulse Gen Serial Number: 7761558

## 2015-07-17 MED FILL — Denosumab Inj 60 MG/ML: SUBCUTANEOUS | Qty: 1 | Status: AC

## 2015-08-04 ENCOUNTER — Ambulatory Visit (INDEPENDENT_AMBULATORY_CARE_PROVIDER_SITE_OTHER): Payer: Medicare Other | Admitting: *Deleted

## 2015-08-04 DIAGNOSIS — I495 Sick sinus syndrome: Secondary | ICD-10-CM

## 2015-08-04 NOTE — Progress Notes (Signed)
Remote pacemaker transmission.   

## 2015-08-06 ENCOUNTER — Encounter: Payer: Self-pay | Admitting: Cardiology

## 2015-08-06 LAB — CUP PACEART REMOTE DEVICE CHECK
Battery Remaining Percentage: 95.5 %
Brady Statistic AS VS Percent: 33 %
Implantable Lead Implant Date: 20160421
Implantable Lead Location: 753859
Lead Channel Impedance Value: 390 Ohm
Lead Channel Sensing Intrinsic Amplitude: 12 mV
Lead Channel Setting Pacing Pulse Width: 0.5 ms
Lead Channel Setting Sensing Sensitivity: 2 mV
MDC IDC LEAD IMPLANT DT: 20160421
MDC IDC LEAD LOCATION: 753860
MDC IDC MSMT BATTERY REMAINING LONGEVITY: 115 mo
MDC IDC MSMT BATTERY VOLTAGE: 3.02 V
MDC IDC MSMT LEADCHNL RA SENSING INTR AMPL: 1.6 mV
MDC IDC MSMT LEADCHNL RV IMPEDANCE VALUE: 460 Ohm
MDC IDC SESS DTM: 20170710060028
MDC IDC SET LEADCHNL RA PACING AMPLITUDE: 2 V
MDC IDC SET LEADCHNL RV PACING AMPLITUDE: 2.5 V
MDC IDC STAT BRADY AP VP PERCENT: 1 %
MDC IDC STAT BRADY AP VS PERCENT: 67 %
MDC IDC STAT BRADY AS VP PERCENT: 1 %
MDC IDC STAT BRADY RA PERCENT PACED: 66 %
MDC IDC STAT BRADY RV PERCENT PACED: 1 %
Pulse Gen Model: 2240
Pulse Gen Serial Number: 7761558

## 2015-08-29 DIAGNOSIS — E559 Vitamin D deficiency, unspecified: Secondary | ICD-10-CM | POA: Diagnosis not present

## 2015-08-29 DIAGNOSIS — E119 Type 2 diabetes mellitus without complications: Secondary | ICD-10-CM | POA: Diagnosis not present

## 2015-08-29 DIAGNOSIS — Z6821 Body mass index (BMI) 21.0-21.9, adult: Secondary | ICD-10-CM | POA: Diagnosis not present

## 2015-08-29 DIAGNOSIS — I10 Essential (primary) hypertension: Secondary | ICD-10-CM | POA: Diagnosis not present

## 2015-08-29 DIAGNOSIS — Z950 Presence of cardiac pacemaker: Secondary | ICD-10-CM | POA: Diagnosis not present

## 2015-08-29 DIAGNOSIS — D6489 Other specified anemias: Secondary | ICD-10-CM | POA: Diagnosis not present

## 2015-08-29 DIAGNOSIS — M810 Age-related osteoporosis without current pathological fracture: Secondary | ICD-10-CM | POA: Diagnosis not present

## 2015-08-29 DIAGNOSIS — I4891 Unspecified atrial fibrillation: Secondary | ICD-10-CM | POA: Diagnosis not present

## 2015-10-11 DIAGNOSIS — Z23 Encounter for immunization: Secondary | ICD-10-CM | POA: Diagnosis not present

## 2015-11-03 ENCOUNTER — Ambulatory Visit (INDEPENDENT_AMBULATORY_CARE_PROVIDER_SITE_OTHER): Payer: Medicare Other | Admitting: *Deleted

## 2015-11-03 DIAGNOSIS — I495 Sick sinus syndrome: Secondary | ICD-10-CM

## 2015-11-03 NOTE — Progress Notes (Signed)
Remote pacemaker transmission.   

## 2015-11-04 ENCOUNTER — Encounter: Payer: Self-pay | Admitting: Cardiology

## 2015-11-25 ENCOUNTER — Other Ambulatory Visit: Payer: Self-pay | Admitting: Cardiovascular Disease

## 2015-11-28 LAB — CUP PACEART REMOTE DEVICE CHECK
Battery Remaining Longevity: 114 mo
Brady Statistic AP VP Percent: 1 %
Brady Statistic AP VS Percent: 67 %
Brady Statistic AS VP Percent: 1 %
Brady Statistic RA Percent Paced: 67 %
Brady Statistic RV Percent Paced: 1 %
Date Time Interrogation Session: 20171009061625
Implantable Lead Implant Date: 20160421
Implantable Lead Location: 753860
Lead Channel Impedance Value: 380 Ohm
Lead Channel Impedance Value: 450 Ohm
Lead Channel Pacing Threshold Amplitude: 1 V
Lead Channel Pacing Threshold Pulse Width: 0.5 ms
Lead Channel Sensing Intrinsic Amplitude: 12 mV
Lead Channel Setting Pacing Amplitude: 2 V
Lead Channel Setting Pacing Pulse Width: 0.5 ms
Lead Channel Setting Sensing Sensitivity: 2 mV
MDC IDC LEAD IMPLANT DT: 20160421
MDC IDC LEAD LOCATION: 753859
MDC IDC MSMT BATTERY REMAINING PERCENTAGE: 95.5 %
MDC IDC MSMT BATTERY VOLTAGE: 3.02 V
MDC IDC MSMT LEADCHNL RA PACING THRESHOLD AMPLITUDE: 0.75 V
MDC IDC MSMT LEADCHNL RA SENSING INTR AMPL: 1.7 mV
MDC IDC MSMT LEADCHNL RV PACING THRESHOLD PULSEWIDTH: 0.5 ms
MDC IDC PG IMPLANT DT: 20160421
MDC IDC SET LEADCHNL RV PACING AMPLITUDE: 2.5 V
MDC IDC STAT BRADY AS VS PERCENT: 33 %
Pulse Gen Model: 2240
Pulse Gen Serial Number: 7761558

## 2015-12-15 ENCOUNTER — Ambulatory Visit (HOSPITAL_COMMUNITY)
Admission: RE | Admit: 2015-12-15 | Discharge: 2015-12-15 | Disposition: A | Discharge status: Home / Self Care | Payer: Medicare Other | Source: Ambulatory Visit | Attending: Internal Medicine | Admitting: Internal Medicine

## 2015-12-15 ENCOUNTER — Other Ambulatory Visit (HOSPITAL_COMMUNITY): Payer: Self-pay | Admitting: Internal Medicine

## 2015-12-15 ENCOUNTER — Encounter (HOSPITAL_COMMUNITY): Payer: Self-pay

## 2015-12-15 DIAGNOSIS — M810 Age-related osteoporosis without current pathological fracture: Secondary | ICD-10-CM | POA: Diagnosis not present

## 2015-12-15 MED ORDER — DENOSUMAB 60 MG/ML ~~LOC~~ SOLN
60.0000 mg | Freq: Once | SUBCUTANEOUS | Status: AC
  Administered 2015-12-15: 60 mg via SUBCUTANEOUS
  Filled 2015-12-15: qty 1

## 2016-02-02 ENCOUNTER — Ambulatory Visit (INDEPENDENT_AMBULATORY_CARE_PROVIDER_SITE_OTHER): Payer: Medicare Other | Admitting: *Deleted

## 2016-02-02 DIAGNOSIS — I495 Sick sinus syndrome: Secondary | ICD-10-CM

## 2016-02-02 NOTE — Progress Notes (Signed)
Remote pacemaker transmission.   

## 2016-02-04 ENCOUNTER — Encounter: Payer: Self-pay | Admitting: Cardiology

## 2016-02-17 LAB — CUP PACEART REMOTE DEVICE CHECK
Battery Voltage: 3.01 V
Brady Statistic AP VP Percent: 1 %
Brady Statistic AS VP Percent: 1 %
Brady Statistic AS VS Percent: 32 %
Implantable Lead Implant Date: 20160421
Lead Channel Impedance Value: 360 Ohm
Lead Channel Impedance Value: 460 Ohm
Lead Channel Pacing Threshold Amplitude: 0.75 V
Lead Channel Pacing Threshold Pulse Width: 0.5 ms
Lead Channel Sensing Intrinsic Amplitude: 12 mV
Lead Channel Setting Pacing Amplitude: 2.5 V
MDC IDC LEAD IMPLANT DT: 20160421
MDC IDC LEAD LOCATION: 753859
MDC IDC LEAD LOCATION: 753860
MDC IDC MSMT BATTERY REMAINING LONGEVITY: 113 mo
MDC IDC MSMT BATTERY REMAINING PERCENTAGE: 95.5 %
MDC IDC MSMT LEADCHNL RA PACING THRESHOLD PULSEWIDTH: 0.5 ms
MDC IDC MSMT LEADCHNL RA SENSING INTR AMPL: 1 mV
MDC IDC MSMT LEADCHNL RV PACING THRESHOLD AMPLITUDE: 1 V
MDC IDC PG IMPLANT DT: 20160421
MDC IDC PG SERIAL: 7761558
MDC IDC SESS DTM: 20180108070014
MDC IDC SET LEADCHNL RA PACING AMPLITUDE: 2 V
MDC IDC SET LEADCHNL RV PACING PULSEWIDTH: 0.5 ms
MDC IDC SET LEADCHNL RV SENSING SENSITIVITY: 2 mV
MDC IDC STAT BRADY AP VS PERCENT: 68 %
MDC IDC STAT BRADY RA PERCENT PACED: 68 %
MDC IDC STAT BRADY RV PERCENT PACED: 1 %

## 2016-03-15 DIAGNOSIS — Z0000 Encounter for general adult medical examination without abnormal findings: Secondary | ICD-10-CM | POA: Diagnosis not present

## 2016-03-15 DIAGNOSIS — E538 Deficiency of other specified B group vitamins: Secondary | ICD-10-CM | POA: Diagnosis not present

## 2016-03-15 DIAGNOSIS — E119 Type 2 diabetes mellitus without complications: Secondary | ICD-10-CM | POA: Diagnosis not present

## 2016-03-15 DIAGNOSIS — E559 Vitamin D deficiency, unspecified: Secondary | ICD-10-CM | POA: Diagnosis not present

## 2016-03-15 DIAGNOSIS — N390 Urinary tract infection, site not specified: Secondary | ICD-10-CM | POA: Diagnosis not present

## 2016-03-15 DIAGNOSIS — I10 Essential (primary) hypertension: Secondary | ICD-10-CM | POA: Diagnosis not present

## 2016-03-15 DIAGNOSIS — E784 Other hyperlipidemia: Secondary | ICD-10-CM | POA: Diagnosis not present

## 2016-03-22 ENCOUNTER — Ambulatory Visit (INDEPENDENT_AMBULATORY_CARE_PROVIDER_SITE_OTHER): Payer: Medicare Other | Admitting: Podiatry

## 2016-03-22 ENCOUNTER — Encounter: Payer: Self-pay | Admitting: Podiatry

## 2016-03-22 VITALS — Resp 16

## 2016-03-22 DIAGNOSIS — Z1231 Encounter for screening mammogram for malignant neoplasm of breast: Secondary | ICD-10-CM | POA: Diagnosis not present

## 2016-03-22 DIAGNOSIS — B351 Tinea unguium: Secondary | ICD-10-CM

## 2016-03-22 DIAGNOSIS — L608 Other nail disorders: Secondary | ICD-10-CM | POA: Diagnosis not present

## 2016-03-22 DIAGNOSIS — R2689 Other abnormalities of gait and mobility: Secondary | ICD-10-CM | POA: Diagnosis not present

## 2016-03-22 DIAGNOSIS — E119 Type 2 diabetes mellitus without complications: Secondary | ICD-10-CM | POA: Diagnosis not present

## 2016-03-22 DIAGNOSIS — R001 Bradycardia, unspecified: Secondary | ICD-10-CM | POA: Diagnosis not present

## 2016-03-22 DIAGNOSIS — Z950 Presence of cardiac pacemaker: Secondary | ICD-10-CM | POA: Diagnosis not present

## 2016-03-22 DIAGNOSIS — M79609 Pain in unspecified limb: Secondary | ICD-10-CM

## 2016-03-22 DIAGNOSIS — L603 Nail dystrophy: Secondary | ICD-10-CM | POA: Diagnosis not present

## 2016-03-22 DIAGNOSIS — I4891 Unspecified atrial fibrillation: Secondary | ICD-10-CM | POA: Diagnosis not present

## 2016-03-22 DIAGNOSIS — Z6820 Body mass index (BMI) 20.0-20.9, adult: Secondary | ICD-10-CM | POA: Diagnosis not present

## 2016-03-22 DIAGNOSIS — M810 Age-related osteoporosis without current pathological fracture: Secondary | ICD-10-CM | POA: Diagnosis not present

## 2016-03-22 DIAGNOSIS — Z1389 Encounter for screening for other disorder: Secondary | ICD-10-CM | POA: Diagnosis not present

## 2016-03-22 DIAGNOSIS — R946 Abnormal results of thyroid function studies: Secondary | ICD-10-CM | POA: Diagnosis not present

## 2016-03-22 DIAGNOSIS — Z0000 Encounter for general adult medical examination without abnormal findings: Secondary | ICD-10-CM | POA: Diagnosis not present

## 2016-03-28 NOTE — Progress Notes (Signed)
   SUBJECTIVE Patient  presents to office today complaining of elongated, thickened nails. Pain while ambulating in shoes. Patient is unable to trim their own nails.   OBJECTIVE General Patient is awake, alert, and oriented x 3 and in no acute distress. Derm Skin is dry and supple bilateral. Negative open lesions or macerations. Remaining integument unremarkable. Nails are tender, long, thickened and dystrophic with subungual debris, consistent with onychomycosis, 1-5 bilateral. No signs of infection noted. Vasc  DP and PT pedal pulses palpable bilaterally. Temperature gradient within normal limits.  Neuro Epicritic and protective threshold sensation diminished bilaterally.  Musculoskeletal Exam No symptomatic pedal deformities noted bilateral. Muscular strength within normal limits.  ASSESSMENT 1. Onychodystrophic nails 1-5 bilateral with hyperkeratosis of nails.  2. Onychomycosis of nail due to dermatophyte bilateral 3. Pain in foot bilateral  PLAN OF CARE 1. Patient evaluated today.  2. Instructed to maintain good pedal hygiene and foot care.  3. Mechanical debridement of nails 1-5 bilaterally performed using a nail nipper. Filed with dremel without incident.  4. Return to clinic in 3 mos.    Johnrobert Foti M. Oneil Behney, DPM [facility]  Dr. Farooq Petrovich M. Jonta Gastineau, DPM    [address]                                        [place], [place] [address]                Office [phone]  Fax [phone]      

## 2016-04-07 DIAGNOSIS — R946 Abnormal results of thyroid function studies: Secondary | ICD-10-CM | POA: Diagnosis not present

## 2016-04-21 ENCOUNTER — Encounter: Payer: Self-pay | Admitting: Internal Medicine

## 2016-04-21 ENCOUNTER — Ambulatory Visit (INDEPENDENT_AMBULATORY_CARE_PROVIDER_SITE_OTHER): Payer: Medicare Other | Admitting: Internal Medicine

## 2016-04-21 VITALS — BP 162/70 | HR 64 | Ht 62.0 in | Wt 124.2 lb

## 2016-04-21 DIAGNOSIS — I495 Sick sinus syndrome: Secondary | ICD-10-CM | POA: Diagnosis not present

## 2016-04-21 DIAGNOSIS — R001 Bradycardia, unspecified: Secondary | ICD-10-CM | POA: Diagnosis not present

## 2016-04-21 DIAGNOSIS — I480 Paroxysmal atrial fibrillation: Secondary | ICD-10-CM | POA: Diagnosis not present

## 2016-04-21 NOTE — Progress Notes (Signed)
HPI [name] [name] returns today for followup. She is a pleasant 83 yo woman with symptomatic bradycardia, PAF, HTN, who underwent PPM insertion 2 years ago. She returns today for followup. She has no chest pain or sob. No syncope. She has no specific complaints today.   No Known Allergies   Current Outpatient Prescriptions  Medication Sig Dispense Refill  . acetaminophen (TYLENOL) 500 MG tablet Take 500 mg by mouth every 6 (six) hours as needed for mild pain.    Marland Kitchen atorvastatin (LIPITOR) 20 MG tablet TAKE 1 TABLET (20 MG TOTAL) BY MOUTH AT BEDTIME. 30 tablet 11  . calcium-vitamin D (OSCAL WITH D) 500-200 MG-UNIT per tablet Take 1 tablet by mouth daily with breakfast.    . Collagenase (SANTYL EX) Apply 1 application topically daily.    . Cyanocobalamin (NASCOBAL NA) Place 1 spray into both nostrils once a week. saturday    . denosumab (PROLIA) 60 MG/ML SOLN injection Inject 60 mg into the skin every 6 (six) months. Administer in upper arm, thigh, or abdomen    . diltiazem (CARDIZEM CD) 120 MG 24 hr capsule TAKE 1 CAPSULE (120 MG TOTAL) BY MOUTH DAILY. 30 capsule 11  . ELIQUIS 2.5 MG TABS tablet TAKE 1 TABLET BY MOUTH TWICE A DAY 60 tablet 4  . ferrous sulfate (SLOW FE) 160 (50 FE) MG TBCR SR tablet Take 160 mg by mouth daily.    . hydrochlorothiazide (HYDRODIURIL) 25 MG tablet Take 12.5 mg by mouth daily.    Marland Kitchen KLOR-CON 10 10 MEQ tablet Take 10 mEq by mouth daily.  3  . LORazepam (ATIVAN) 1 MG tablet Take 1 mg by mouth at bedtime.    . metoprolol (LOPRESSOR) 50 MG tablet Take 50 mg by mouth 2 (two) times daily.    . mometasone (NASONEX) 50 MCG/ACT nasal spray Place 2 sprays into both nostrils daily.    . Multiple Vitamins-Minerals (MULTIVITAMIN WITH MINERALS) tablet Take 1 tablet by mouth daily.    Marland Kitchen NASCOBAL 500 MCG/0.1ML SOLN Place 500 mcg into both nostrils as directed.  3  . Omega-3 Fatty Acids (FISH OIL) 1000 MG CPDR Take 1,000 mg by mouth daily.    Vladimir Faster Glycol-Propyl  Glycol 0.4-0.3 % SOLN Apply 1 drop to eye daily as needed (dryness).    . potassium chloride (K-DUR,KLOR-CON) 10 MEQ tablet Take 10 mEq by mouth daily.    . sucralfate (CARAFATE) 1 G tablet Take 1 g by mouth 4 (four) times daily -  before meals and at bedtime.    . traMADol (ULTRAM) 50 MG tablet Take 50-100 mg by mouth 3 (three) times daily as needed (pain).    . Vitamin D, Ergocalciferol, (DRISDOL) 50000 UNITS CAPS capsule Take 1 capsule by mouth once a week.  2   No current facility-administered medications for this visit.      Past Medical History:  Diagnosis Date  . Atrial fibrillation ([place])    a. on Eliquis  . Hypertension   . Stomach cancer ([place])    1992  . Tachy-brady syndrome ([place])    a. s/p STJ dual chamber PPM implant    ROS:   All systems reviewed and negative except as noted in the HPI.   Past Surgical History:  Procedure Laterality Date  . BACK SURGERY    . CHOLECYSTECTOMY    . PARTIAL GASTRECTOMY  1992  . PERMANENT PACEMAKER INSERTION N/A 05/16/2014   STJ Assurity dual chamber pacemaker implanted by Dr Lovena Le  .  TOTAL ABDOMINAL HYSTERECTOMY       Family History  Problem Relation Age of Onset  . Heart disease Father     Dropsy  . Pneumonia Mother   . Sudden death Daughter 51  . Heart attack Son 63     Social History   Social History  . Marital status: Married    Spouse name: N/A  . Number of children: 4  . Years of education: N/A   Occupational History  . Not on file.   Social History Main Topics  . Smoking status: Never Smoker  . Smokeless tobacco: Never Used  . Alcohol use No  . Drug use: Unknown  . Sexual activity: Not on file   Other Topics Concern  . Not on file   Social History Narrative   Lives alone.       BP (!) 162/70   Pulse 64   Ht 5\' 2"  (1.575 m)   Wt 124 lb 3.2 oz (56.3 kg)   SpO2 97%   BMI 22.72 kg/m   Physical Exam:  Well appearing elderly woman, NAD HEENT: Unremarkable Neck:  6 cm JVD, no  thyromegally Back:  No CVA tenderness Lungs:  Clear with no wheezes HEART:  Regular rate rhythm, no murmurs, no rubs, no clicks Abd:  soft, positive bowel sounds, no organomegally, no rebound, no guarding Ext:  2 plus pulses, no edema, no cyanosis, no clubbing Skin:  No rashes no nodules Neuro:  CN II through XII intact, motor grossly intact  DEVICE  Normal device function.  See PaceArt for details.   Assess/Plan: 1. Symptomatic sinus node dysfunction - she is maintaining NSR. She is asymptomatic after her PPM was placed. 2. HTN - her blood pressure is a bit up today. Will follow. 3. PAF - she is tolerating low dose Eliquis nicely and she will continue. She is mostly in NSR and has had minimal palpitations. She will continue her anti-coag. 4. PPM - her St. Jude DDD PM is working normally. Will recheck in several months.   Mikle Bosworth.D.

## 2016-04-21 NOTE — Patient Instructions (Signed)
Your physician recommends that you continue on your current medications as directed. Please refer to the Current Medication list given to you today.  Remote monitoring is used to monitor your Pacemaker of ICD from home. This monitoring reduces the number of office visits required to check your device to one time per year. It allows [place] to keep an eye on the functioning of your device to ensure it is working properly. You are scheduled for a device check from home on 07/21/16 . You may send your transmission at any time that day. If you have a wireless device, the transmission will be sent automatically. After your physician reviews your transmission, you will receive a postcard with your next transmission date.  Your physician wants you to follow-up in: 12 months with Dr. Lovena Le.  You will receive a reminder letter in the mail two months in advance. If you don't receive a letter, please call our office to schedule the follow-up appointment.

## 2016-04-30 LAB — CUP PACEART INCLINIC DEVICE CHECK
Implantable Lead Implant Date: 20160421
Implantable Pulse Generator Implant Date: 20160421
MDC IDC LEAD IMPLANT DT: 20160421
MDC IDC LEAD LOCATION: 753859
MDC IDC LEAD LOCATION: 753860
MDC IDC PG SERIAL: 7761558
MDC IDC SESS DTM: 20180406084509

## 2016-05-10 ENCOUNTER — Other Ambulatory Visit: Payer: Self-pay | Admitting: Internal Medicine

## 2016-05-31 ENCOUNTER — Inpatient Hospital Stay (HOSPITAL_COMMUNITY)
Admission: EM | Admit: 2016-05-31 | Discharge: 2016-06-07 | DRG: 872 | Disposition: A | Discharge status: Home Health | Payer: Medicare Other | Attending: Internal Medicine | Admitting: Internal Medicine

## 2016-05-31 ENCOUNTER — Encounter (HOSPITAL_COMMUNITY): Payer: Self-pay | Admitting: *Deleted

## 2016-05-31 ENCOUNTER — Emergency Department (HOSPITAL_COMMUNITY): Payer: Medicare Other

## 2016-05-31 DIAGNOSIS — Z90710 Acquired absence of both cervix and uterus: Secondary | ICD-10-CM | POA: Diagnosis not present

## 2016-05-31 DIAGNOSIS — K830 Cholangitis: Secondary | ICD-10-CM | POA: Diagnosis not present

## 2016-05-31 DIAGNOSIS — Z8241 Family history of sudden cardiac death: Secondary | ICD-10-CM | POA: Diagnosis not present

## 2016-05-31 DIAGNOSIS — I10 Essential (primary) hypertension: Secondary | ICD-10-CM | POA: Diagnosis not present

## 2016-05-31 DIAGNOSIS — R7881 Bacteremia: Secondary | ICD-10-CM | POA: Diagnosis present

## 2016-05-31 DIAGNOSIS — Z85028 Personal history of other malignant neoplasm of stomach: Secondary | ICD-10-CM | POA: Diagnosis not present

## 2016-05-31 DIAGNOSIS — Z950 Presence of cardiac pacemaker: Secondary | ICD-10-CM

## 2016-05-31 DIAGNOSIS — I480 Paroxysmal atrial fibrillation: Secondary | ICD-10-CM | POA: Diagnosis not present

## 2016-05-31 DIAGNOSIS — Z9049 Acquired absence of other specified parts of digestive tract: Secondary | ICD-10-CM | POA: Diagnosis not present

## 2016-05-31 DIAGNOSIS — I495 Sick sinus syndrome: Secondary | ICD-10-CM | POA: Diagnosis not present

## 2016-05-31 DIAGNOSIS — R1111 Vomiting without nausea: Secondary | ICD-10-CM | POA: Diagnosis not present

## 2016-05-31 DIAGNOSIS — Z7901 Long term (current) use of anticoagulants: Secondary | ICD-10-CM | POA: Diagnosis not present

## 2016-05-31 DIAGNOSIS — E876 Hypokalemia: Secondary | ICD-10-CM | POA: Diagnosis present

## 2016-05-31 DIAGNOSIS — K831 Obstruction of bile duct: Secondary | ICD-10-CM | POA: Diagnosis not present

## 2016-05-31 DIAGNOSIS — R945 Abnormal results of liver function studies: Secondary | ICD-10-CM | POA: Diagnosis not present

## 2016-05-31 DIAGNOSIS — I4891 Unspecified atrial fibrillation: Secondary | ICD-10-CM | POA: Diagnosis not present

## 2016-05-31 DIAGNOSIS — Z8249 Family history of ischemic heart disease and other diseases of the circulatory system: Secondary | ICD-10-CM | POA: Diagnosis not present

## 2016-05-31 DIAGNOSIS — I482 Chronic atrial fibrillation: Secondary | ICD-10-CM | POA: Diagnosis present

## 2016-05-31 DIAGNOSIS — K8309 Other cholangitis: Secondary | ICD-10-CM | POA: Diagnosis present

## 2016-05-31 DIAGNOSIS — K5641 Fecal impaction: Secondary | ICD-10-CM | POA: Diagnosis not present

## 2016-05-31 DIAGNOSIS — R935 Abnormal findings on diagnostic imaging of other abdominal regions, including retroperitoneum: Secondary | ICD-10-CM | POA: Diagnosis not present

## 2016-05-31 DIAGNOSIS — K8050 Calculus of bile duct without cholangitis or cholecystitis without obstruction: Secondary | ICD-10-CM | POA: Diagnosis not present

## 2016-05-31 DIAGNOSIS — N200 Calculus of kidney: Secondary | ICD-10-CM | POA: Diagnosis not present

## 2016-05-31 DIAGNOSIS — E8809 Other disorders of plasma-protein metabolism, not elsewhere classified: Secondary | ICD-10-CM | POA: Diagnosis present

## 2016-05-31 DIAGNOSIS — A4151 Sepsis due to Escherichia coli [E. coli]: Principal | ICD-10-CM | POA: Diagnosis present

## 2016-05-31 DIAGNOSIS — R1030 Lower abdominal pain, unspecified: Secondary | ICD-10-CM | POA: Diagnosis not present

## 2016-05-31 DIAGNOSIS — R64 Cachexia: Secondary | ICD-10-CM | POA: Diagnosis present

## 2016-05-31 DIAGNOSIS — R197 Diarrhea, unspecified: Secondary | ICD-10-CM | POA: Diagnosis not present

## 2016-05-31 DIAGNOSIS — N179 Acute kidney failure, unspecified: Secondary | ICD-10-CM | POA: Diagnosis present

## 2016-05-31 DIAGNOSIS — Z8719 Personal history of other diseases of the digestive system: Secondary | ICD-10-CM | POA: Diagnosis not present

## 2016-05-31 DIAGNOSIS — Z681 Body mass index (BMI) 19 or less, adult: Secondary | ICD-10-CM | POA: Diagnosis not present

## 2016-05-31 DIAGNOSIS — E785 Hyperlipidemia, unspecified: Secondary | ICD-10-CM | POA: Diagnosis present

## 2016-05-31 DIAGNOSIS — R609 Edema, unspecified: Secondary | ICD-10-CM | POA: Diagnosis not present

## 2016-05-31 DIAGNOSIS — Z836 Family history of other diseases of the respiratory system: Secondary | ICD-10-CM | POA: Diagnosis not present

## 2016-05-31 DIAGNOSIS — A4150 Gram-negative sepsis, unspecified: Secondary | ICD-10-CM | POA: Diagnosis present

## 2016-05-31 DIAGNOSIS — D649 Anemia, unspecified: Secondary | ICD-10-CM | POA: Diagnosis not present

## 2016-05-31 DIAGNOSIS — R112 Nausea with vomiting, unspecified: Secondary | ICD-10-CM | POA: Diagnosis present

## 2016-05-31 DIAGNOSIS — R6520 Severe sepsis without septic shock: Secondary | ICD-10-CM | POA: Diagnosis present

## 2016-05-31 DIAGNOSIS — Z9689 Presence of other specified functional implants: Secondary | ICD-10-CM | POA: Diagnosis not present

## 2016-05-31 DIAGNOSIS — K8030 Calculus of bile duct with cholangitis, unspecified, without obstruction: Secondary | ICD-10-CM | POA: Diagnosis present

## 2016-05-31 DIAGNOSIS — Z8679 Personal history of other diseases of the circulatory system: Secondary | ICD-10-CM | POA: Diagnosis not present

## 2016-05-31 DIAGNOSIS — R1114 Bilious vomiting: Secondary | ICD-10-CM | POA: Diagnosis not present

## 2016-05-31 DIAGNOSIS — A419 Sepsis, unspecified organism: Secondary | ICD-10-CM | POA: Diagnosis not present

## 2016-05-31 LAB — COMPREHENSIVE METABOLIC PANEL
ALT: 580 U/L — AB (ref 14–54)
AST: 200 U/L — AB (ref 15–41)
Albumin: 3.2 g/dL — ABNORMAL LOW (ref 3.5–5.0)
Alkaline Phosphatase: 558 U/L — ABNORMAL HIGH (ref 38–126)
Anion gap: 8 (ref 5–15)
BILIRUBIN TOTAL: 2.4 mg/dL — AB (ref 0.3–1.2)
BUN: 22 mg/dL — AB (ref 6–20)
CO2: 25 mmol/L (ref 22–32)
CREATININE: 0.96 mg/dL (ref 0.44–1.00)
Calcium: 8.9 mg/dL (ref 8.9–10.3)
Chloride: 106 mmol/L (ref 101–111)
GFR calc Af Amer: 56 mL/min — ABNORMAL LOW (ref >60)
GFR, EST NON AFRICAN AMERICAN: 48 mL/min — AB (ref >60)
Glucose, Bld: 99 mg/dL (ref 65–99)
POTASSIUM: 3.3 mmol/L — AB (ref 3.5–5.1)
SODIUM: 139 mmol/L (ref 135–145)
TOTAL PROTEIN: 6.3 g/dL — AB (ref 6.5–8.1)

## 2016-05-31 LAB — CBC
HCT: 29.2 % — ABNORMAL LOW (ref 36.0–46.0)
Hemoglobin: 9.8 g/dL — ABNORMAL LOW (ref 12.0–15.0)
MCH: 33.1 pg (ref 26.0–34.0)
MCHC: 33.6 g/dL (ref 30.0–36.0)
MCV: 98.6 fL (ref 78.0–100.0)
Platelets: 152 10*3/uL (ref 150–400)
RBC: 2.96 MIL/uL — ABNORMAL LOW (ref 3.87–5.11)
RDW: 13.5 % (ref 11.5–15.5)
WBC: 9 10*3/uL (ref 4.0–10.5)

## 2016-05-31 LAB — LIPASE, BLOOD

## 2016-05-31 LAB — HEPARIN LEVEL (UNFRACTIONATED): HEPARIN UNFRACTIONATED: 1.76 [IU]/mL — AB (ref 0.30–0.70)

## 2016-05-31 LAB — APTT

## 2016-05-31 MED ORDER — PROMETHAZINE HCL 25 MG/ML IJ SOLN
6.2500 mg | Freq: Four times a day (QID) | INTRAMUSCULAR | Status: DC | PRN
  Administered 2016-06-01: 6.25 mg via INTRAVENOUS
  Filled 2016-05-31 (×2): qty 1

## 2016-05-31 MED ORDER — SODIUM CHLORIDE 0.9 % IV SOLN
1000.0000 mL | INTRAVENOUS | Status: DC
  Administered 2016-05-31: 1000 mL via INTRAVENOUS

## 2016-05-31 MED ORDER — CALCIUM CARBONATE-VITAMIN D 500-200 MG-UNIT PO TABS
1.0000 | ORAL_TABLET | Freq: Every day | ORAL | Status: DC
  Administered 2016-06-02 – 2016-06-07 (×6): 1 via ORAL
  Filled 2016-05-31 (×6): qty 1

## 2016-05-31 MED ORDER — ONDANSETRON HCL 4 MG/2ML IJ SOLN
4.0000 mg | Freq: Once | INTRAMUSCULAR | Status: AC
  Administered 2016-05-31: 4 mg via INTRAVENOUS
  Filled 2016-05-31: qty 2

## 2016-05-31 MED ORDER — IOPAMIDOL (ISOVUE-300) INJECTION 61%
30.0000 mL | Freq: Once | INTRAVENOUS | Status: AC | PRN
  Administered 2016-05-31: 30 mL via ORAL

## 2016-05-31 MED ORDER — SUCRALFATE 1 G PO TABS
1.0000 g | ORAL_TABLET | Freq: Three times a day (TID) | ORAL | Status: DC
  Filled 2016-05-31: qty 1

## 2016-05-31 MED ORDER — POTASSIUM CHLORIDE ER 10 MEQ PO TBCR
10.0000 meq | EXTENDED_RELEASE_TABLET | Freq: Every day | ORAL | Status: DC
  Filled 2016-05-31 (×2): qty 1

## 2016-05-31 MED ORDER — TRAMADOL HCL 50 MG PO TABS
50.0000 mg | ORAL_TABLET | Freq: Three times a day (TID) | ORAL | Status: DC | PRN
  Administered 2016-06-01 (×3): 50 mg via ORAL
  Filled 2016-05-31 (×4): qty 1

## 2016-05-31 MED ORDER — SODIUM CHLORIDE 0.9 % IV SOLN
1000.0000 mL | Freq: Once | INTRAVENOUS | Status: AC
  Administered 2016-05-31: 1000 mL via INTRAVENOUS

## 2016-05-31 MED ORDER — POLYETHYL GLYCOL-PROPYL GLYCOL 0.4-0.3 % OP SOLN: 1.0000 [drp] | Freq: Every day | OPHTHALMIC | Status: DC | PRN

## 2016-05-31 MED ORDER — POLYVINYL ALCOHOL 1.4 % OP SOLN
1.0000 [drp] | OPHTHALMIC | Status: DC | PRN
  Filled 2016-05-31: qty 15

## 2016-05-31 MED ORDER — SODIUM CHLORIDE 0.9 % IV BOLUS (SEPSIS)
1000.0000 mL | Freq: Once | INTRAVENOUS | Status: AC
  Administered 2016-05-31: 1000 mL via INTRAVENOUS

## 2016-05-31 MED ORDER — ACETAMINOPHEN 325 MG PO TABS
650.0000 mg | ORAL_TABLET | Freq: Four times a day (QID) | ORAL | Status: DC | PRN
Start: 2016-05-31 — End: 2016-06-07

## 2016-05-31 MED ORDER — HEPARIN BOLUS VIA INFUSION
2000.0000 [IU] | Freq: Once | INTRAVENOUS | Status: AC
  Administered 2016-05-31: 2000 [IU] via INTRAVENOUS
  Filled 2016-05-31: qty 2000

## 2016-05-31 MED ORDER — LORAZEPAM 1 MG PO TABS
1.0000 mg | ORAL_TABLET | Freq: Every day | ORAL | Status: DC
  Administered 2016-05-31 – 2016-06-04 (×5): 1 mg via ORAL
  Filled 2016-05-31 (×5): qty 1

## 2016-05-31 MED ORDER — IOPAMIDOL (ISOVUE-300) INJECTION 61%
INTRAVENOUS | Status: AC
  Filled 2016-05-31: qty 30

## 2016-05-31 MED ORDER — ONDANSETRON HCL 4 MG/2ML IJ SOLN
4.0000 mg | Freq: Four times a day (QID) | INTRAMUSCULAR | Status: DC | PRN
  Administered 2016-06-01: 4 mg via INTRAVENOUS
  Filled 2016-05-31: qty 2

## 2016-05-31 MED ORDER — METOPROLOL TARTRATE 25 MG PO TABS
50.0000 mg | ORAL_TABLET | Freq: Two times a day (BID) | ORAL | Status: DC
  Administered 2016-05-31 – 2016-06-07 (×14): 50 mg via ORAL
  Filled 2016-05-31 (×14): qty 2

## 2016-05-31 MED ORDER — SODIUM CHLORIDE 0.9 % IV SOLN: INTRAVENOUS | Status: AC

## 2016-05-31 MED ORDER — OMEGA-3-ACID ETHYL ESTERS 1 G PO CAPS
1.0000 g | ORAL_CAPSULE | Freq: Every day | ORAL | Status: DC
  Administered 2016-06-02 – 2016-06-07 (×6): 1 g via ORAL
  Filled 2016-05-31 (×6): qty 1

## 2016-05-31 MED ORDER — ACETAMINOPHEN 650 MG RE SUPP
650.0000 mg | Freq: Four times a day (QID) | RECTAL | Status: DC | PRN
Start: 2016-05-31 — End: 2016-06-07

## 2016-05-31 MED ORDER — CYANOCOBALAMIN 500 MCG/0.1ML NA SOLN: 500.0000 ug | NASAL | Status: DC

## 2016-05-31 MED ORDER — POTASSIUM CHLORIDE CRYS ER 10 MEQ PO TBCR
30.0000 meq | EXTENDED_RELEASE_TABLET | Freq: Once | ORAL | Status: AC
  Administered 2016-05-31: 23:00:00 30 meq via ORAL
  Filled 2016-05-31: qty 1

## 2016-05-31 MED ORDER — HEPARIN (PORCINE) IN NACL 100-0.45 UNIT/ML-% IJ SOLN
750.0000 [IU]/h | INTRAMUSCULAR | Status: DC
  Administered 2016-05-31: 750 [IU]/h via INTRAVENOUS
  Filled 2016-05-31: qty 250

## 2016-05-31 MED ORDER — ADULT MULTIVITAMIN W/MINERALS CH
1.0000 | ORAL_TABLET | Freq: Every day | ORAL | Status: DC
  Filled 2016-05-31: qty 1

## 2016-05-31 MED ORDER — IOPAMIDOL (ISOVUE-300) INJECTION 61%
INTRAVENOUS | Status: AC
  Administered 2016-05-31: 80 mL
  Filled 2016-05-31: qty 100

## 2016-05-31 MED ORDER — ONDANSETRON HCL 4 MG/2ML IJ SOLN: 4.0000 mg | Freq: Once | INTRAMUSCULAR | Status: DC | PRN

## 2016-05-31 MED ORDER — DILTIAZEM HCL ER COATED BEADS 120 MG PO CP24
120.0000 mg | ORAL_CAPSULE | Freq: Every day | ORAL | Status: DC
  Administered 2016-06-01 – 2016-06-07 (×7): 120 mg via ORAL
  Filled 2016-05-31 (×7): qty 1

## 2016-05-31 MED ORDER — FENTANYL CITRATE (PF) 100 MCG/2ML IJ SOLN: 25.0000 ug | INTRAMUSCULAR | Status: DC | PRN

## 2016-05-31 MED ORDER — POLYETHYLENE GLYCOL 3350 17 G PO PACK: 17.0000 g | PACK | Freq: Every day | ORAL | Status: DC | PRN

## 2016-05-31 MED ORDER — FLUTICASONE PROPIONATE 50 MCG/ACT NA SUSP
2.0000 | Freq: Every day | NASAL | Status: DC
  Administered 2016-06-01 – 2016-06-07 (×7): 2 via NASAL
  Filled 2016-05-31: qty 16

## 2016-05-31 NOTE — ED Provider Notes (Signed)
[facility] DEPT Provider Note   CSN: [id number] Arrival date & time: 05/31/16  1245     History   Chief Complaint Chief Complaint  Patient presents with  . Emesis  . Diarrhea    HPI [name] [name] is a 83 y.o. female.  HPI Patient presents emergency department complaining of nausea vomiting diarrhea over the past 4 days.  She denies hematemesis.  No melena or hematochezia.  She's had a prior cholecystectomy reports no significant abdominal pain at this time.  No chest pain shortness breath.  No recent sick contacts.  No fevers or chills.  She reports weak and dehydrated.  She is 83 years old and is here with her family.   Past Medical History:  Diagnosis Date  . Atrial fibrillation ([place])    a. on Eliquis  . Hypertension   . Stomach cancer ([place])    1992  . Tachy-brady syndrome ([place])    a. s/p STJ dual chamber PPM implant    Patient Active Problem List   Diagnosis Date Noted  . Pneumothorax, left 05/17/2014  . Tachy-brady syndrome ([place]) 05/16/2014  . Bradycardia 05/16/2014  . Foot ulcer ([place]) 05/16/2014  . Essential hypertension 05/16/2014  . Atrial flutter ([place]) 05/16/2014  . Sinus node dysfunction - s/p [employer]. Jude (serial number [id number]) PPM   . Atrial fibrillation ([place]) 05/15/2014    Past Surgical History:  Procedure Laterality Date  . BACK SURGERY    . CHOLECYSTECTOMY    . PARTIAL GASTRECTOMY  1992  . PERMANENT PACEMAKER INSERTION N/A 05/16/2014   STJ Assurity dual chamber pacemaker implanted by Dr Lovena Le  . TOTAL ABDOMINAL HYSTERECTOMY      OB History    No data available       Home Medications    Prior to Admission medications   Medication Sig Start Date End Date Taking? Authorizing Provider  acetaminophen (TYLENOL) 500 MG tablet Take 500 mg by mouth every 6 (six) hours as needed for mild pain.   Yes [provider]  atorvastatin (LIPITOR) 20 MG tablet TAKE 1 TABLET (20 MG TOTAL) BY MOUTH AT BEDTIME. 05/12/16  Yes Evans Lance, MD    calcium-vitamin D (OSCAL WITH D) 500-200 MG-UNIT per tablet Take 1 tablet by mouth daily with breakfast.   Yes [provider]  Collagenase (SANTYL EX) Apply 1 application topically daily.   Yes [provider]  Cyanocobalamin (NASCOBAL NA) Place 1 spray into both nostrils once a week. saturday   Yes [provider]  denosumab (PROLIA) 60 MG/ML SOLN injection Inject 60 mg into the skin every 6 (six) months. Administer in upper arm, thigh, or abdomen   Yes [provider]  diltiazem (CARDIZEM CD) 120 MG 24 hr capsule TAKE 1 CAPSULE (120 MG TOTAL) BY MOUTH DAILY. 06/04/15  Yes Barrett, Rhonda G, PA-C  ELIQUIS 2.5 MG TABS tablet TAKE 1 TABLET BY MOUTH TWICE A DAY 11/25/15  Yes Barrett, Rhonda G, PA-C  ferrous sulfate (SLOW FE) 160 (50 FE) MG TBCR SR tablet Take 160 mg by mouth daily.   Yes [provider]  hydrochlorothiazide (HYDRODIURIL) 25 MG tablet Take 12.5 mg by mouth daily.   Yes [provider]  KLOR-CON 10 10 MEQ tablet Take 10 mEq by mouth daily. 02/17/15  Yes [provider]  LORazepam (ATIVAN) 1 MG tablet Take 1 mg by mouth at bedtime.   Yes [provider]  metoprolol (LOPRESSOR) 50 MG tablet Take 50 mg by mouth 2 (two) times  daily.   Yes [provider]  mometasone (NASONEX) 50 MCG/ACT nasal spray Place 2 sprays into both nostrils daily.   Yes [provider]  Multiple Vitamins-Minerals (MULTIVITAMIN WITH MINERALS) tablet Take 1 tablet by mouth daily.   Yes [provider]  NASCOBAL 500 MCG/0.1ML SOLN Place 500 mcg into both nostrils as directed. 04/14/15  Yes [provider]  Omega-3 Fatty Acids (FISH OIL) 1000 MG CPDR Take 1,000 mg by mouth daily.   Yes [provider]  Polyethyl Glycol-Propyl Glycol 0.4-0.3 % SOLN Apply 1 drop to eye daily as needed (dryness).   Yes [provider]  potassium chloride (K-DUR,KLOR-CON) 10 MEQ tablet Take 10 mEq by mouth daily.    Yes [provider]  sucralfate (CARAFATE) 1 G tablet Take 1 g by mouth 4 (four) times daily -  before meals and at bedtime.   Yes [provider]  traMADol (ULTRAM) 50 MG tablet Take 50-100 mg by mouth 3 (three) times daily as needed (pain).   Yes [provider]  Vitamin D, Ergocalciferol, (DRISDOL) 50000 UNITS CAPS capsule Take 1 capsule by mouth once a week. 08/05/14  Yes [provider]    Family History Family History  Problem Relation Age of Onset  . Heart disease Father     Dropsy  . Pneumonia Mother   . Sudden death Daughter 7  . Heart attack Son 79    Social History Social History  Substance Use Topics  . Smoking status: Never Smoker  . Smokeless tobacco: Never Used  . Alcohol use No     Allergies   Patient has no known allergies.   Review of Systems Review of Systems  All other systems reviewed and are negative.    Physical Exam Updated Vital Signs BP (!) 145/58   Pulse 78   Temp 98.9 F (37.2 C) (Oral)   Resp 16   Ht 5\' 5"  (1.651 m)   Wt 118 lb (53.5 kg)   SpO2 98%   BMI 19.64 kg/m   Physical Exam  Constitutional: She is oriented to person, place, and time. She appears well-developed and well-nourished. No distress.  HENT:  Head: Normocephalic and atraumatic.  Eyes: EOM are normal.  Neck: Normal range of motion.  Cardiovascular: Normal rate, regular rhythm and normal heart sounds.   Pulmonary/Chest: Effort normal and breath sounds normal.  Abdominal: Soft. She exhibits no distension. There is no tenderness.  Musculoskeletal: Normal range of motion.  Neurological: She is alert and oriented to person, place, and time.  Skin: Skin is warm and dry.  Psychiatric: She has a normal mood and affect. Judgment normal.  Nursing note and vitals reviewed.    ED Treatments / Results  Labs (all labs ordered are listed, but only abnormal results are displayed) Labs Reviewed  COMPREHENSIVE METABOLIC PANEL - Abnormal;  Notable for the following:       Result Value   Potassium 3.3 (*)    BUN 22 (*)    Total Protein 6.3 (*)    Albumin 3.2 (*)    AST 200 (*)    ALT 580 (*)    Alkaline Phosphatase 558 (*)    Total Bilirubin 2.4 (*)    GFR calc non Af Amer 48 (*)    GFR calc Af Amer 56 (*)    All other components within normal limits  CBC - Abnormal; Notable for the following:    RBC 2.96 (*)    Hemoglobin 9.8 (*)  HCT 29.2 (*)    All other components within normal limits  LIPASE, BLOOD - Abnormal; Notable for the following:    Lipase <10 (*)    All other components within normal limits  URINALYSIS, ROUTINE W REFLEX MICROSCOPIC  HEPATITIS PANEL, ACUTE    EKG  EKG Interpretation None       Radiology No results found.  Procedures Procedures (including critical care time)  Medications Ordered in ED Medications  ondansetron (ZOFRAN) injection 4 mg (not administered)  0.9 %  sodium chloride infusion (1,000 mLs Intravenous New Bag/Given 05/31/16 1529)    Followed by  0.9 %  sodium chloride infusion (not administered)  iopamidol (ISOVUE-300) 61 % injection (not administered)  sodium chloride 0.9 % bolus 1,000 mL (0 mLs Intravenous Stopped 05/31/16 1528)  ondansetron (ZOFRAN) injection 4 mg (4 mg Intravenous Given 05/31/16 1330)  iopamidol (ISOVUE-300) 61 % injection 30 mL (30 mLs Oral Contrast Given 05/31/16 1518)     Initial Impression / Assessment and Plan / ED Course  I have reviewed the triage vital signs and the nursing notes.  Pertinent labs & imaging results that were available during my care of the patient were reviewed by me and considered in my medical decision making (see chart for details).     Elevated LFTs and alkaline phosphatase.  Patient will undergo CT imaging to further evaluate.  Per family she has had a prior episode of choledocholithiasis status post cholecystectomy.  No right upper quadrant tenderness at this time  Care to Dr Canary Brim to follow up on CT imaging at  which point pt will need admission to the hospital  Final Clinical Impressions(s) / ED Diagnoses   Final diagnoses:  None    New Prescriptions New Prescriptions   No medications on file     Jola Schmidt, MD 05/31/16 1620

## 2016-05-31 NOTE — ED Triage Notes (Signed)
Per EMS, pt from home, reports n/v/d since Thursday.  Pt is A&Ox 4.  Denies any abd pain at this time.

## 2016-05-31 NOTE — ED Provider Notes (Signed)
6:48 PM discussed lab and CT results with [facility] on call.  He recommends MRCP tomorrow morning.  Calling hospitalist now for admission.    7:01 PM d/w Dr. Myna Hidalgo, pt will be admitted.    Alfonzo Beers, MD 05/31/16 Lurline Hare

## 2016-05-31 NOTE — Progress Notes (Signed)
ANTICOAGULATION CONSULT NOTE - Initial Consult  Pharmacy Consult for Heparin Indication: atrial fibrillation  No Known Allergies  Patient Measurements: Height: 5\' 5"  (165.1 cm) Weight: 118 lb (53.5 kg) IBW/kg (Calculated) : 57 Heparin Dosing Weight: actual body weight  Vital Signs: Temp: 98.9 F (37.2 C) (05/07 1305) Temp Source: Oral (05/07 1305) BP: 145/60 (05/07 2000) Pulse Rate: 78 (05/07 2000)  Labs:  Recent Labs  05/31/16 1308  HGB 9.8*  HCT 29.2*  PLT 152  CREATININE 0.96    Estimated Creatinine Clearance: 28.9 mL/min (by C-G formula based on SCr of 0.96 mg/dL).   Medical History: Past Medical History:  Diagnosis Date  . Atrial fibrillation ([place])    a. on Eliquis  . Hypertension   . Stomach cancer ([place])    1992  . Tachy-brady syndrome ([facility])    a. s/p STJ dual chamber PPM implant    Assessment:  83 yr female presents with c/o vomiting and diarrhea.  PMH significant for AFib (on Eliquis), HTN, h/o stomach cancer.  CT shows choledocholithiasis vs biliary abnormality/mass  Further procedures anticipated; therefore Eliquis currently on hold and pharmacy has been consulted to dose IV heparin  PTA Eliquis = 2.5mg  BID with last dose reported as taken 5/6 @ 18:30  Goal of Therapy:  Heparin level 0.3-0.7 units/ml aPTT 66-102 seconds Monitor platelets by anticoagulation protocol: Yes   Plan:   Obtain baseline aPTT and heparin level  As it has been > 24 hr since last Eliquis dose, will begin Heparin with 2000 unit IV bolus x 1 followed by heparin infusion @ 750 units/hr  Check aPTT and heparin level 8 hr after heparin started  Monitor heparin level & CBC daily while on heparin; will follow aPTT until Eliquis clears and heparin level correlates with aPTT  Ora Mcnatt, Toribio Harbour, PharmD 05/31/2016,8:35 PM

## 2016-05-31 NOTE — ED Notes (Signed)
Family at bedside. 

## 2016-05-31 NOTE — H&P (Signed)
History and Physical    [name] [name] RSW:[id number] DOB: Jun 09, 1917 DOA: 05/31/2016  PCP: Crist Infante, MD   Patient coming from: Home  Chief Complaint: Nausea and vomiting   HPI: [name] [name] is a 83 y.o. female with medical history significant for cancer of the stomach status post remote resection, tachybradycardia syndrome status post pacer placement, hypertension, and chronic atrial fibrillation on Eliquis who presents to the emergency department for evaluation of nausea and vomiting. Patient reports that she been in her usual state of health until she developed nausea with nonbloody nonbilious vomiting on 05/27/2016. This persisted throughout the day, but then resolved before recurring this morning. She reports constant nausea throughout the day today with continued nonbloody, nonbilious vomiting. She denies any abdominal pain or indigestion, but notes some loose stools. She denies hematochezia or melena. She reports a history of cholelithiasis and choledocholithiasis with remote cholecystectomy, but reports being told that some ductal stones could not be evacuated. She has been experiencing any abdominal pain since that time. She reports some chills, but denies fevers. She denies dysuria or flank pain. She took a dose of Eliquis the day prior to admission, but not this morning.   ED Course: Upon arrival to the ED, patient is found to be afebrile, saturating well on room air, and with vitals otherwise stable. Chemistry panel is notable for potassium 3.3, elevated BUN/creatinine to creatinine ratio, alkaline phosphatase of 558, AST 200, ALT 580, and total bilirubin of 2.4. CMP was unremarkable 2 years ago. CBC features a stable normocytic anemia with hemoglobin of 9.8 and lipase level is undetectable below. CT of the abdomen and pelvis with contrast was obtained and notable for diffuse extensive left intrahepatic biliary ductal dilation extending to the biliary confluence with central left hepatic  ill-defined nodular soft tissue attenuation, possibly reflecting choledocholithiasis or mass. Patient was treated with Zofran, 1 L of normal saline, and gastroenterology was consulted by the ED physician. Consultant advised a medical admission with MRCP and indicated that they will plan to evaluate the patient in the morning. Patient remained hemodynamically stable in the ED and has not been in any apparent respiratory distress. She'll be admitted to the medical surgical unit for ongoing evaluation and management of nausea and vomiting secondary to biliary obstruction.  Review of Systems:  All other systems reviewed and apart from HPI, are negative.  Past Medical History:  Diagnosis Date  . Atrial fibrillation ([place])    a. on Eliquis  . Hypertension   . Stomach cancer ([place])    1992  . Tachy-brady syndrome ([place])    a. s/p STJ dual chamber PPM implant    Past Surgical History:  Procedure Laterality Date  . BACK SURGERY    . CHOLECYSTECTOMY    . PARTIAL GASTRECTOMY  1992  . PERMANENT PACEMAKER INSERTION N/A 05/16/2014   STJ Assurity dual chamber pacemaker implanted by Dr Lovena Le  . TOTAL ABDOMINAL HYSTERECTOMY       reports that she has never smoked. She has never used smokeless tobacco. She reports that she does not drink alcohol. Her drug history is not on file.  No Known Allergies  Family History  Problem Relation Age of Onset  . Heart disease Father     Dropsy  . Pneumonia Mother   . Sudden death Daughter 8  . Heart attack Son 1     Prior to Admission medications   Medication Sig Start Date End Date Taking? Authorizing Provider  acetaminophen (TYLENOL) 500 MG tablet  Take 500 mg by mouth every 6 (six) hours as needed for mild pain.   Yes [provider]  atorvastatin (LIPITOR) 20 MG tablet TAKE 1 TABLET (20 MG TOTAL) BY MOUTH AT BEDTIME. 05/12/16  Yes Evans Lance, MD  calcium-vitamin D (OSCAL WITH D) 500-200 MG-UNIT per tablet Take 1 tablet by mouth daily with  breakfast.   Yes [provider]  Collagenase (SANTYL EX) Apply 1 application topically daily.   Yes [provider]  Cyanocobalamin (NASCOBAL NA) Place 1 spray into both nostrils once a week. saturday   Yes [provider]  denosumab (PROLIA) 60 MG/ML SOLN injection Inject 60 mg into the skin every 6 (six) months. Administer in upper arm, thigh, or abdomen   Yes [provider]  diltiazem (CARDIZEM CD) 120 MG 24 hr capsule TAKE 1 CAPSULE (120 MG TOTAL) BY MOUTH DAILY. 06/04/15  Yes Barrett, Rhonda G, PA-C  ELIQUIS 2.5 MG TABS tablet TAKE 1 TABLET BY MOUTH TWICE A DAY 11/25/15  Yes Barrett, Rhonda G, PA-C  ferrous sulfate (SLOW FE) 160 (50 FE) MG TBCR SR tablet Take 160 mg by mouth daily.   Yes [provider]  hydrochlorothiazide (HYDRODIURIL) 25 MG tablet Take 12.5 mg by mouth daily.   Yes [provider]  KLOR-CON 10 10 MEQ tablet Take 10 mEq by mouth daily. 02/17/15  Yes [provider]  LORazepam (ATIVAN) 1 MG tablet Take 1 mg by mouth at bedtime.   Yes [provider]  metoprolol (LOPRESSOR) 50 MG tablet Take 50 mg by mouth 2 (two) times daily.   Yes [provider]  mometasone (NASONEX) 50 MCG/ACT nasal spray Place 2 sprays into both nostrils daily.   Yes [provider]  Multiple Vitamins-Minerals (MULTIVITAMIN WITH MINERALS) tablet Take 1 tablet by mouth daily.   Yes [provider]  NASCOBAL 500 MCG/0.1ML SOLN Place 500 mcg into both nostrils as directed. 04/14/15  Yes [provider]  Omega-3 Fatty Acids (FISH OIL) 1000 MG CPDR Take 1,000 mg by mouth daily.   Yes [provider]  Polyethyl Glycol-Propyl Glycol 0.4-0.3 % SOLN Apply 1 drop to eye daily as needed (dryness).   Yes [provider]  potassium chloride (K-DUR,KLOR-CON) 10 MEQ tablet Take 10 mEq by mouth daily.   Yes [provider]  sucralfate (CARAFATE) 1 G tablet Take 1 g by mouth 4 (four) times  daily -  before meals and at bedtime.   Yes [provider]  traMADol (ULTRAM) 50 MG tablet Take 50-100 mg by mouth 3 (three) times daily as needed (pain).   Yes [provider]  Vitamin D, Ergocalciferol, (DRISDOL) 50000 UNITS CAPS capsule Take 1 capsule by mouth once a week. 08/05/14  Yes [provider]    Physical Exam: Vitals:   05/31/16 1630 05/31/16 1700 05/31/16 1814 05/31/16 2024  BP: (!) 145/61 (!) 139/58 (!) 137/56   Pulse: 80 82 84   Resp:   18   Temp:      TempSrc:      SpO2: 94% 95% 95% 98%  Weight:      Height:          Constitutional: No acute distress, calm. Appears uncomfortable.  Eyes: PERTLA, lids and conjunctivae normal ENMT: Mucous membranes are moist. Posterior pharynx clear of any exudate or lesions.   Neck: normal, supple, no masses, no thyromegaly Respiratory: clear to auscultation bilaterally, no wheezing, no crackles. Normal respiratory effort.   Cardiovascular: S1 &  S2 heard, regular rate and rhythm. No extremity edema. No significant JVD. Abdomen: No distension, no tenderness, no masses palpated. Bowel sounds active.  Musculoskeletal: no clubbing / cyanosis. No joint deformity upper and lower extremities.   Skin: no significant rashes, lesions, ulcers. Warm, dry, well-perfused. Neurologic: CN 2-12 grossly intact. Sensation intact, DTR normal. Strength 5/5 in all 4 limbs.  Psychiatric: Alert and oriented x 3. Normal mood and affect.     Labs on Admission: I have personally reviewed following labs and imaging studies  CBC:  Recent Labs Lab 05/31/16 1308  WBC 9.0  HGB 9.8*  HCT 29.2*  MCV 98.6  PLT 595   Basic Metabolic Panel:  Recent Labs Lab 05/31/16 1308  NA 139  K 3.3*  CL 106  CO2 25  GLUCOSE 99  BUN 22*  CREATININE 0.96  CALCIUM 8.9   GFR: Estimated Creatinine Clearance: 28.9 mL/min (by C-G formula based on SCr of 0.96 mg/dL). Liver Function Tests:  Recent Labs Lab 05/31/16 1308  AST 200*    ALT 580*  ALKPHOS 558*  BILITOT 2.4*  PROT 6.3*  ALBUMIN 3.2*    Recent Labs Lab 05/31/16 1523  LIPASE <10*   No results for input(s): AMMONIA in the last 168 hours. Coagulation Profile: No results for input(s): INR, PROTIME in the last 168 hours. Cardiac Enzymes: No results for input(s): CKTOTAL, CKMB, CKMBINDEX, TROPONINI in the last 168 hours. BNP (last 3 results) No results for input(s): PROBNP in the last 8760 hours. HbA1C: No results for input(s): HGBA1C in the last 72 hours. CBG: No results for input(s): GLUCAP in the last 168 hours. Lipid Profile: No results for input(s): CHOL, HDL, LDLCALC, TRIG, CHOLHDL, LDLDIRECT in the last 72 hours. Thyroid Function Tests: No results for input(s): TSH, T4TOTAL, FREET4, T3FREE, THYROIDAB in the last 72 hours. Anemia Panel: No results for input(s): VITAMINB12, FOLATE, FERRITIN, TIBC, IRON, RETICCTPCT in the last 72 hours. Urine analysis: No results found for: COLORURINE, APPEARANCEUR, LABSPEC, PHURINE, GLUCOSEU, HGBUR, BILIRUBINUR, KETONESUR, PROTEINUR, UROBILINOGEN, NITRITE, LEUKOCYTESUR Sepsis Labs: _0 (procalcitonin:4,lacticidven:4) )No results found for this or any previous visit (from the past 240 hour(s)).   Radiological Exams on Admission: Ct Abdomen Pelvis W Contrast  Result Date: 05/31/2016 CLINICAL DATA:  Acute abdominal pain, vomiting, diarrhea, elevated LFTs, prior cholecystectomy EXAM: CT ABDOMEN AND PELVIS WITH CONTRAST TECHNIQUE: Multidetector CT imaging of the abdomen and pelvis was performed using the standard protocol following bolus administration of intravenous contrast. CONTRAST:  80 cc Isovue-300 COMPARISON:  None available FINDINGS: Lower chest: Bibasilar scarring/ atelectasis. Cardiomegaly evident. No pericardial or pleural effusion. Pacer wires noted in the right heart. Extensive atherosclerosis of the descending thoracic aorta. Hepatobiliary: Remote cholecystectomy. Intrahepatic biliary dilatation in  the left hepatic lobe extends to the biliary confluence where there is rounded soft tissue/nodular attenuation, images 17 through 19. Differential considerations include upper biliary tree choledocholithiasis resulting in the ductal obstruction. Difficult to exclude biliary stricture or neoplastic process such as cholangiocarcinoma at this level. Minor right biliary prominence. Common bile duct is dilated but extends all the way to the ampulla and may be chronically dilated related to the cholecystectomy. Pancreas is diffusely thin atrophic without appreciable mass or focal abnormality. Pancreas: Diffuse atrophy. No ductal dilatation. No surrounding inflammatory process or fluid collection. Spleen: Normal in size without focal abnormality. Adrenals/Urinary Tract: Normal adrenal glands for age. Kidneys demonstrate normal symmetric enhancement. No obstructing ureteral calculus or hydroureter. Urinary bladder grossly unremarkable. Nonobstructing punctate left nephrolithiasis measures 5 mm, image 25. Stomach/Bowel:  Negative for bowel obstruction, significant dilatation, ileus, or free air. Colonic diverticulosis noted. Large volume of stool in the rectum, suggest fecal impaction. No fluid collection or abscess. Vascular/Lymphatic: Extensive atherosclerosis of the aorta and abdominal vasculature. Negative for aneurysm. No occlusive process. Iliac vessels are heavily calcified and tortuous but remain patent. No adenopathy. Reproductive: Remote hysterectomy. No adnexal mass. No pelvic free fluid or fluid collection. Other: No inguinal abnormality.  Intact abdominal wall. Musculoskeletal: Bones are osteopenic. Advanced degenerative spondylosis of the spine. Chronic changes of the lumbar spine with deformity at at L4-5. No acute osseous finding. IMPRESSION: Diffuse extensive left intrahepatic biliary dilatation extends to the biliary confluence. Central left hepatic duct nodular ill-defined soft tissue attenuation may  represent upper biliary tree choledocholithiasis versus underlying intra biliary abnormality or mass such as cholangiocarcinoma. Mild biliary prominence of the right hepatic ducts and mild dilatation of the common bile duct may be secondary to a remote cholecystectomy. Consider further evaluation with GI consultation. Extensive abdominal and pelvic atherosclerosis Nonobstructing left nephrolithiasis Colonic diverticulosis Electronically Signed   By: Jerilynn Mages.  Shick M.D.   On: 05/31/2016 17:53    EKG: Not performed, will obtain as appropriate.   Assessment/Plan  1. Biliary obstruction  - Pt presents with 4 days intermittent nausea and vomiting, becoming more persistent - There has not been any pain associated with this  - Alk phos, transaminases, and bilirubin are elevated; had been wnl in 2016  - CT abd/pelvis demonstrates extensive ductal dilation, possibly secondary to retained stone or mass  - GI is consulting and much appreciated - MRCP has been ordered, pending review of her pacer for MR-compatibility  - Continue supportive care with gentle IVF hydration, antiemetics   2. Nausea, vomiting   - Likely secondary to the biliary obstruction as above  - Continue IVF, monitor and replete lytes, prn antiemetics    3. Atrial fibrillation  - Chronic; rate is well-controlled - CHADS-VASc is 52 (age x2, gender, HTN)  - Hold Eliquis should procedure be required this admission (last dose 05/30/16); continue AC with heparin infusion for now  - Conitinue metoprolol   4. Hypertension - BP is at goal  - Continue Lopressor as tolerated; hold HCTZ while hydrating   5. Hypokalemia  - Serum potassium is 3.3  - Likely secondary to GI-losses, treating N/V as above  - Given 30 mEq oral potassium  - Repeat chem panel in am    6. Normocytic anemia  - Hgb is 9.8 on admission  - There is no bleeding and Hgb stable relative to priors    DVT prophylaxis: Eliquis PTA, IV heparin on admission Code Status: Full    Family Communication: Daughter updated at bedside Disposition Plan: Admit to med-surg Consults called: Gastroenterology Admission status: Inpatient    Vianne Bulls, MD [facility] Pager [phone]  If 7PM-7AM, please contact night-coverage [email or web address] Password [facility]  05/31/2016, 8:28 PM

## 2016-05-31 NOTE — ED Notes (Signed)
Bed: PF29 Expected date:  Expected time:  Means of arrival:  Comments: EMS 83yo, n/v/d

## 2016-05-31 NOTE — ED Notes (Signed)
Assisted pt to the BR, pt ambulates with a walker

## 2016-06-01 ENCOUNTER — Encounter (HOSPITAL_COMMUNITY): Payer: Self-pay

## 2016-06-01 DIAGNOSIS — R935 Abnormal findings on diagnostic imaging of other abdominal regions, including retroperitoneum: Secondary | ICD-10-CM

## 2016-06-01 DIAGNOSIS — R1114 Bilious vomiting: Secondary | ICD-10-CM

## 2016-06-01 DIAGNOSIS — R1030 Lower abdominal pain, unspecified: Secondary | ICD-10-CM

## 2016-06-01 DIAGNOSIS — R945 Abnormal results of liver function studies: Secondary | ICD-10-CM

## 2016-06-01 LAB — CBC WITH DIFFERENTIAL/PLATELET
BASOS ABS: 0 10*3/uL (ref 0.0–0.1)
Basophils Relative: 0 %
EOS ABS: 0 10*3/uL (ref 0.0–0.7)
EOS PCT: 0 %
HCT: 24.9 % — ABNORMAL LOW (ref 36.0–46.0)
Hemoglobin: 8.6 g/dL — ABNORMAL LOW (ref 12.0–15.0)
Lymphocytes Relative: 10 %
Lymphs Abs: 1.3 10*3/uL (ref 0.7–4.0)
MCH: 33.9 pg (ref 26.0–34.0)
MCHC: 34.5 g/dL (ref 30.0–36.0)
MCV: 98 fL (ref 78.0–100.0)
MONO ABS: 1 10*3/uL (ref 0.1–1.0)
Monocytes Relative: 8 %
Neutro Abs: 10.6 10*3/uL — ABNORMAL HIGH (ref 1.7–7.7)
Neutrophils Relative %: 82 %
PLATELETS: 136 10*3/uL — AB (ref 150–400)
RBC: 2.54 MIL/uL — AB (ref 3.87–5.11)
RDW: 13.8 % (ref 11.5–15.5)
WBC: 13 10*3/uL — AB (ref 4.0–10.5)

## 2016-06-01 LAB — HEPATITIS PANEL, ACUTE
HCV Ab: <0.1 s/co ratio (ref 0.0–0.9)
HEP B C IGM: NEGATIVE
Hep A IgM: NEGATIVE
Hepatitis B Surface Ag: NEGATIVE

## 2016-06-01 LAB — COMPREHENSIVE METABOLIC PANEL
ALT: 385 U/L — AB (ref 14–54)
AST: 120 U/L — AB (ref 15–41)
Albumin: 2.7 g/dL — ABNORMAL LOW (ref 3.5–5.0)
Alkaline Phosphatase: 454 U/L — ABNORMAL HIGH (ref 38–126)
Anion gap: 5 (ref 5–15)
BUN: 18 mg/dL (ref 6–20)
CALCIUM: 7.8 mg/dL — AB (ref 8.9–10.3)
CHLORIDE: 110 mmol/L (ref 101–111)
CO2: 22 mmol/L (ref 22–32)
CREATININE: 0.82 mg/dL (ref 0.44–1.00)
GFR calc Af Amer: >60 mL/min (ref >60)
GFR calc non Af Amer: 59 mL/min — ABNORMAL LOW (ref >60)
Glucose, Bld: 86 mg/dL (ref 65–99)
POTASSIUM: 3.7 mmol/L (ref 3.5–5.1)
SODIUM: 137 mmol/L (ref 135–145)
TOTAL PROTEIN: 5.5 g/dL — AB (ref 6.5–8.1)
Total Bilirubin: 1.3 mg/dL — ABNORMAL HIGH (ref 0.3–1.2)

## 2016-06-01 LAB — APTT
aPTT: >200 s (ref 24–36)
aPTT: 136 seconds — ABNORMAL HIGH (ref 24–36)

## 2016-06-01 LAB — HEPARIN LEVEL (UNFRACTIONATED)
Heparin Unfractionated: 1.38 [IU]/mL — ABNORMAL HIGH (ref 0.30–0.70)
Heparin Unfractionated: 1 IU/mL — ABNORMAL HIGH (ref 0.30–0.70)

## 2016-06-01 MED ORDER — CIPROFLOXACIN IN D5W 400 MG/200ML IV SOLN
400.0000 mg | Freq: Two times a day (BID) | INTRAVENOUS | Status: DC
  Administered 2016-06-01 – 2016-06-02 (×2): 400 mg via INTRAVENOUS
  Filled 2016-06-01 (×2): qty 200

## 2016-06-01 MED ORDER — SODIUM CHLORIDE 0.9 % IV SOLN
INTRAVENOUS | Status: DC
  Administered 2016-06-01 – 2016-06-02 (×2): via INTRAVENOUS
  Administered 2016-06-02: 100 mL via INTRAVENOUS
  Administered 2016-06-03 (×2): via INTRAVENOUS

## 2016-06-01 MED ORDER — ADULT MULTIVITAMIN W/MINERALS CH
1.0000 | ORAL_TABLET | Freq: Every day | ORAL | Status: DC
  Administered 2016-06-02 – 2016-06-07 (×6): 1 via ORAL
  Filled 2016-06-01 (×6): qty 1

## 2016-06-01 MED ORDER — HEPARIN (PORCINE) IN NACL 100-0.45 UNIT/ML-% IJ SOLN
600.0000 [IU]/h | INTRAMUSCULAR | Status: DC
  Filled 2016-06-01: qty 250

## 2016-06-01 MED ORDER — HEPARIN (PORCINE) IN NACL 100-0.45 UNIT/ML-% IJ SOLN
450.0000 [IU]/h | INTRAMUSCULAR | Status: DC
  Filled 2016-06-01 (×2): qty 250

## 2016-06-01 NOTE — Progress Notes (Signed)
ANTICOAGULATION CONSULT NOTE -  Pharmacy Consult for Heparin Indication: atrial fibrillation  No Known Allergies  Patient Measurements: Height: 5\' 5"  (165.1 cm) Weight: 118 lb (53.5 kg) IBW/kg (Calculated) : 57 Heparin Dosing Weight: actual body weight  Vital Signs: Temp: 98.2 F (36.8 C) (05/08 1352) Temp Source: Oral (05/08 1352) BP: 157/68 (05/08 1352) Pulse Rate: 91 (05/08 1352)  Labs:  Recent Labs  05/31/16 1308 05/31/16 2150 06/01/16 0530 06/01/16 1735  HGB 9.8*  --  8.6*  --   HCT 29.2*  --  24.9*  --   PLT 152  --  136*  --   APTT  --  >200* >200* 136*  HEPARINUNFRC  --  1.76* 1.38* 1.00*  CREATININE 0.96  --  0.82  --     Estimated Creatinine Clearance: 33.9 mL/min (by C-G formula based on SCr of 0.82 mg/dL).   Medical History: Past Medical History:  Diagnosis Date  . Atrial fibrillation ([place])    a. on Eliquis  . Hypertension   . Stomach cancer ([place])    1992  . Tachy-brady syndrome ([facility])    a. s/p STJ dual chamber PPM implant    Assessment:  83 yr female presents with c/o vomiting and diarrhea.  PMH significant for AFib (on Eliquis), HTN, h/o stomach cancer.  CT shows choledocholithiasis vs biliary abnormality/mass  Further procedures anticipated; therefore Eliquis currently on hold and pharmacy has been consulted to dose IV heparin  PTA Eliquis = 2.5mg  BID with last dose reported as taken 5/6 @ 18:30  06/01/2016 Heparin level 1.0 and APTT 136 (supratherapeutic) No bleeding or other issues per RN  Goal of Therapy:  Heparin level 0.3-0.7 units/ml aPTT 66-102 seconds Monitor platelets by anticoagulation protocol: Yes   Plan:   Hold heparin x 1 hour and restart heparin at 450 units/hr  Check aPTT and heparin level 8 hr after heparin   Monitor heparin level & CBC daily while on heparin; will follow aPTT until Eliquis clears and heparin level correlates with aPTT  Dolly Rias RPh 06/01/2016, 7:35 PM Pager [phone]

## 2016-06-01 NOTE — Progress Notes (Addendum)
PROGRESS NOTE    [name] [name]  EXN:[id number] DOB: [birth year] DOA: 05/31/2016 PCP: Crist Infante, MD   Brief Narrative: [name] [name] is a 83 y.o. female with medical history of stomach cancer status post remote resection, tachybradycardia syndrome status post pacer placement, eccentric hypertension, chronic fibrillation on Eliquis. She presented with nausea and vomiting   Assessment & Plan:   Principal Problem:   Biliary obstruction Active Problems:   Atrial fibrillation ([facility])   Essential hypertension   Nausea & vomiting   Normocytic anemia   Biliary obstruction Elevated choline phosphatase, transaminases, bilirubin. CT significant for extensive ductal dilatation.? Retained stone. Enzymes improving. Bilirubin improving. -Enterology recommendations -NPO for MRCP pending clearance from radiology  Nausea and vomiting Secondary to biliary obstruction -continue IVF -antiemetics prn  Atrial fibrillation Patient is on Eliquis as an outpatient. CHA2DS2-VASc Score is 4. -Hold Eliquis for possible intervention -Heparin infusion while off of Eliquis  Essential hypertension Hypertensive. -continue Lopressor -restart hydrochlorothiazide  Hypokalemia Resolved with supplementation  Normocytic anemia Hemoglobin of 9.8 on admission. Down to 8.6. No evidence of bleeding -repeat CBC   DVT prophylaxis: Heparin gtt Code Status: Full code Family Communication: Daughter at bedside Disposition Plan: Pending workup for biliary obstruction   Consultants:   Gastroenteroology  Procedures:   None  Antimicrobials:  None    Subjective: Patient reports no pain, nausea or vomiting.  Objective: Vitals:   05/31/16 2041 05/31/16 2121 05/31/16 2142 06/01/16 0540  BP: (!) 142/62 (!) 141/60 (!) 138/54 (!) 154/83  Pulse: 92 85 88 64  Resp: 12  14 16   Temp: 98.1 F (36.7 C) 98 F (36.7 C) 98.1 F (36.7 C) 97.8 F (36.6 C)  TempSrc: Oral Oral Oral Oral  SpO2: 99% 98% 100%  99%  Weight:      Height:        Intake/Output Summary (Last 24 hours) at 06/01/16 1441 Last data filed at 06/01/16 0540  Gross per 24 hour  Intake          2423.13 ml  Output                0 ml  Net          2423.13 ml   Filed Weights   05/31/16 1306  Weight: 53.5 kg (118 lb)    Examination:  General exam: Appears calm and comfortable Respiratory system: Clear to auscultation. Respiratory effort normal. Cardiovascular system: S1 & S2 heard, irregular rhythm, normal rate. No murmurs. Gastrointestinal system: Abdomen is nondistended, soft and nontender. Normal bowel sounds heard. Central nervous system: Alert. Not answering orientation questions. No focal neurological deficits. Extremities: No edema. No calf tenderness Skin: No cyanosis. No rashes. Very mild jaundice Psychiatry: Judgement and insight appear normal. Mood & affect appropriate.     Data Reviewed: I have personally reviewed following labs and imaging studies  CBC:  Recent Labs Lab 05/31/16 1308 06/01/16 0530  WBC 9.0 13.0*  NEUTROABS  --  10.6*  HGB 9.8* 8.6*  HCT 29.2* 24.9*  MCV 98.6 98.0  PLT 152 496*   Basic Metabolic Panel:  Recent Labs Lab 05/31/16 1308 06/01/16 0530  NA 139 137  K 3.3* 3.7  CL 106 110  CO2 25 22  GLUCOSE 99 86  BUN 22* 18  CREATININE 0.96 0.82  CALCIUM 8.9 7.8*   GFR: Estimated Creatinine Clearance: 33.9 mL/min (by C-G formula based on SCr of 0.82 mg/dL). Liver Function Tests:  Recent Labs Lab 05/31/16 1308 06/01/16 0530  AST  200* 120*  ALT 580* 385*  ALKPHOS 558* 454*  BILITOT 2.4* 1.3*  PROT 6.3* 5.5*  ALBUMIN 3.2* 2.7*    Recent Labs Lab 05/31/16 1523  LIPASE <10*   No results for input(s): AMMONIA in the last 168 hours. Coagulation Profile: No results for input(s): INR, PROTIME in the last 168 hours. Cardiac Enzymes: No results for input(s): CKTOTAL, CKMB, CKMBINDEX, TROPONINI in the last 168 hours. BNP (last 3 results) No results for  input(s): PROBNP in the last 8760 hours. HbA1C: No results for input(s): HGBA1C in the last 72 hours. CBG: No results for input(s): GLUCAP in the last 168 hours. Lipid Profile: No results for input(s): CHOL, HDL, LDLCALC, TRIG, CHOLHDL, LDLDIRECT in the last 72 hours. Thyroid Function Tests: No results for input(s): TSH, T4TOTAL, FREET4, T3FREE, THYROIDAB in the last 72 hours. Anemia Panel: No results for input(s): VITAMINB12, FOLATE, FERRITIN, TIBC, IRON, RETICCTPCT in the last 72 hours. Sepsis Labs: No results for input(s): PROCALCITON, LATICACIDVEN in the last 168 hours.  No results found for this or any previous visit (from the past 240 hour(s)).       Radiology Studies: Ct Abdomen Pelvis W Contrast  Result Date: 05/31/2016 CLINICAL DATA:  Acute abdominal pain, vomiting, diarrhea, elevated LFTs, prior cholecystectomy EXAM: CT ABDOMEN AND PELVIS WITH CONTRAST TECHNIQUE: Multidetector CT imaging of the abdomen and pelvis was performed using the standard protocol following bolus administration of intravenous contrast. CONTRAST:  80 cc Isovue-300 COMPARISON:  None available FINDINGS: Lower chest: Bibasilar scarring/ atelectasis. Cardiomegaly evident. No pericardial or pleural effusion. Pacer wires noted in the right heart. Extensive atherosclerosis of the descending thoracic aorta. Hepatobiliary: Remote cholecystectomy. Intrahepatic biliary dilatation in the left hepatic lobe extends to the biliary confluence where there is rounded soft tissue/nodular attenuation, images 17 through 19. Differential considerations include upper biliary tree choledocholithiasis resulting in the ductal obstruction. Difficult to exclude biliary stricture or neoplastic process such as cholangiocarcinoma at this level. Minor right biliary prominence. Common bile duct is dilated but extends all the way to the ampulla and may be chronically dilated related to the cholecystectomy. Pancreas is diffusely thin atrophic  without appreciable mass or focal abnormality. Pancreas: Diffuse atrophy. No ductal dilatation. No surrounding inflammatory process or fluid collection. Spleen: Normal in size without focal abnormality. Adrenals/Urinary Tract: Normal adrenal glands for age. Kidneys demonstrate normal symmetric enhancement. No obstructing ureteral calculus or hydroureter. Urinary bladder grossly unremarkable. Nonobstructing punctate left nephrolithiasis measures 5 mm, image 25. Stomach/Bowel: Negative for bowel obstruction, significant dilatation, ileus, or free air. Colonic diverticulosis noted. Large volume of stool in the rectum, suggest fecal impaction. No fluid collection or abscess. Vascular/Lymphatic: Extensive atherosclerosis of the aorta and abdominal vasculature. Negative for aneurysm. No occlusive process. Iliac vessels are heavily calcified and tortuous but remain patent. No adenopathy. Reproductive: Remote hysterectomy. No adnexal mass. No pelvic free fluid or fluid collection. Other: No inguinal abnormality.  Intact abdominal wall. Musculoskeletal: Bones are osteopenic. Advanced degenerative spondylosis of the spine. Chronic changes of the lumbar spine with deformity at at L4-5. No acute osseous finding. IMPRESSION: Diffuse extensive left intrahepatic biliary dilatation extends to the biliary confluence. Central left hepatic duct nodular ill-defined soft tissue attenuation may represent upper biliary tree choledocholithiasis versus underlying intra biliary abnormality or mass such as cholangiocarcinoma. Mild biliary prominence of the right hepatic ducts and mild dilatation of the common bile duct may be secondary to a remote cholecystectomy. Consider further evaluation with GI consultation. Extensive abdominal and pelvic atherosclerosis Nonobstructing left nephrolithiasis  Colonic diverticulosis Electronically Signed   By: Jerilynn Mages.  Shick M.D.   On: 05/31/2016 17:53        Scheduled Meds: . calcium-vitamin D  1 tablet  Oral Q breakfast  . [START ON 06/05/2016] Cyanocobalamin  500 mcg Each Nare Once per day on Sat  . diltiazem  120 mg Oral Daily  . fluticasone  2 spray Each Nare Daily  . LORazepam  1 mg Oral QHS  . metoprolol  50 mg Oral BID  . multivitamin with minerals  1 tablet Oral Daily  . omega-3 acid ethyl esters  1 g Oral Daily  . potassium chloride  10 mEq Oral Daily  . sucralfate  1 g Oral TID AC & HS   Continuous Infusions: . heparin 600 Units/hr (06/01/16 0830)     LOS: 1 day     Cordelia Poche, MD [facility] 06/01/2016, 2:41 PM Pager: [phone]  If 7PM-7AM, please contact night-coverage [email or web address] Password [email or web address] 06/01/2016, 2:41 PM

## 2016-06-01 NOTE — Progress Notes (Signed)
Patient arrived via stretcher with ED staff. Patient currently on RA. She denies any pain, respiratory distress, CP, HA or n/v at this she is alert/oriented x 3. She has been educated on how to call for help when needing assistance. Family is at bedside. All questions has been answered. Patient is on heparin gtt going at 750 units/hr. Will continue to monitor. Full head to toe skin assessment to follow.

## 2016-06-01 NOTE — Significant Event (Signed)
Lab called stating patient APTT baseline result was >200. On call has been notified. Heparin gtt continuous 750 units/hr.

## 2016-06-01 NOTE — Consult Note (Signed)
Referring Provider: [facility]  Primary Care Physician:  Crist Infante, MD Primary Gastroenterologist: Althia Forts.   Reason for Consultation: abnormal LFTs, bile duct dilation  ASSESSMENT AND PLAN:   1. 83 yo female with abnormal LFTs and biliary duct dilation, mainly intrahepatic. She developed LOWER abdominal pain and vomiting on Sunday per daughter. Lower pain could be from fecal impaction suggested on CTscan. Biliary obstruction possibly secondary to known intrahepatic duct strictures vrs stones vrs neoplasm.  -Patient has a pacemaker, will discuss with Radiology to see if candidate for MRCP  2. AFIB. Eliquis on hold. She is on heparin gtt at present. Marland Kitchen   HPI: [name] [name] is a 83 y.o. female with a very remote history of gastric lymphoma, [name]/p resection with Bilroth II anastomosis.  She is also [name]/p remote open cholecystectomy. She was hospitalized several years ago with acute cholangitis and underwent ERCP with findings of multiple CBD stones. A biliary stent was placed for decompression.. She later underwent repeat ERCP with stent removal, CBD stone removal, and sphincterotomy. Strictures seen in region of the CHD and LHD with residual stones proximally.   Patient brought to ED yesterday with abdominal pain and vomiting. She has received pain meds, is very drowsy and cannot provide history. Per daughter in room patient developed acute lower abdominal pain and vomiting.  on Sunday. She had been slowly losing weight but otherwise felt fine. Found to have abnormal LFTs. CTscan reveals diffuse extensive left intrahepatic biliary dilatation extends to the biliary confluence. Central left hepatic duct nodular ill-defined soft tissue attenuation may represent upper biliary treecholedocholithiasis versus underlying intra biliary abnormality or mass such as cholangiocarcinoma.    Past Medical History:  Diagnosis Date  . Atrial fibrillation ([place])    a. on Eliquis  . Hypertension   .  Stomach cancer ([place])    1992  . Tachy-brady syndrome ([place])    a. [name]/p STJ dual chamber PPM implant    Past Surgical History:  Procedure Laterality Date  . BACK SURGERY    . CHOLECYSTECTOMY    . PARTIAL GASTRECTOMY  1992  . PERMANENT PACEMAKER INSERTION N/A 05/16/2014   STJ Assurity dual chamber pacemaker implanted by Dr Lovena Le  . TOTAL ABDOMINAL HYSTERECTOMY      Prior to Admission medications   Medication Sig Start Date End Date Taking? Authorizing Provider  acetaminophen (TYLENOL) 500 MG tablet Take 500 mg by mouth every 6 (six) hours as needed for mild pain.   Yes [provider]  atorvastatin (LIPITOR) 20 MG tablet TAKE 1 TABLET (20 MG TOTAL) BY MOUTH AT BEDTIME. 05/12/16  Yes Evans Lance, MD  calcium-vitamin D (OSCAL WITH D) 500-200 MG-UNIT per tablet Take 1 tablet by mouth daily with breakfast.   Yes [provider]  Collagenase (SANTYL EX) Apply 1 application topically daily.   Yes [provider]  Cyanocobalamin (NASCOBAL NA) Place 1 spray into both nostrils once a week. saturday   Yes [provider]  denosumab (PROLIA) 60 MG/ML SOLN injection Inject 60 mg into the skin every 6 (six) months. Administer in upper arm, thigh, or abdomen   Yes [provider]  diltiazem (CARDIZEM CD) 120 MG 24 hr capsule TAKE 1 CAPSULE (120 MG TOTAL) BY MOUTH DAILY. 06/04/15  Yes Barrett, Rhonda G, PA-C  ELIQUIS 2.5 MG TABS tablet TAKE 1 TABLET BY MOUTH TWICE A DAY 11/25/15  Yes Barrett, Rhonda G, PA-C  ferrous sulfate (SLOW FE) 160 (50 FE) MG TBCR SR tablet Take 160  mg by mouth daily.   Yes [provider]  hydrochlorothiazide (HYDRODIURIL) 25 MG tablet Take 12.5 mg by mouth daily.   Yes [provider]  KLOR-CON 10 10 MEQ tablet Take 10 mEq by mouth daily. 02/17/15  Yes [provider]  LORazepam (ATIVAN) 1 MG tablet Take 1 mg by mouth at bedtime.   Yes [provider]  metoprolol (LOPRESSOR) 50 MG tablet Take 50 mg  by mouth 2 (two) times daily.   Yes [provider]  mometasone (NASONEX) 50 MCG/ACT nasal spray Place 2 sprays into both nostrils daily.   Yes [provider]  Multiple Vitamins-Minerals (MULTIVITAMIN WITH MINERALS) tablet Take 1 tablet by mouth daily.   Yes [provider]  NASCOBAL 500 MCG/0.1ML SOLN Place 500 mcg into both nostrils as directed. 04/14/15  Yes [provider]  Omega-3 Fatty Acids (FISH OIL) 1000 MG CPDR Take 1,000 mg by mouth daily.   Yes [provider]  Polyethyl Glycol-Propyl Glycol 0.4-0.3 % SOLN Apply 1 drop to eye daily as needed (dryness).   Yes [provider]  potassium chloride (K-DUR,KLOR-CON) 10 MEQ tablet Take 10 mEq by mouth daily.   Yes [provider]  sucralfate (CARAFATE) 1 G tablet Take 1 g by mouth 4 (four) times daily -  before meals and at bedtime.   Yes [provider]  traMADol (ULTRAM) 50 MG tablet Take 50-100 mg by mouth 3 (three) times daily as needed (pain).   Yes [provider]  Vitamin D, Ergocalciferol, (DRISDOL) 50000 UNITS CAPS capsule Take 1 capsule by mouth once a week. 08/05/14  Yes [provider]    Current Facility-Administered Medications  Medication Dose Route Frequency Provider Last Rate Last Dose  . acetaminophen (TYLENOL) tablet 650 mg  650 mg Oral Q6H PRN Opyd, [name] Qua, MD       Or  . acetaminophen (TYLENOL) suppository 650 mg  650 mg Rectal Q6H PRN Opyd, [name] Qua, MD      . calcium-vitamin D (OSCAL WITH D) 500-200 MG-UNIT per tablet 1 tablet  1 tablet Oral Q breakfast Opyd, [name] Qua, MD      . Derrill Memo ON 06/05/2016] Cyanocobalamin SOLN 500 mcg  500 mcg Each Nare Once per day on Sat Opyd, [name] S, MD      . diltiazem (CARDIZEM CD) 24 hr capsule 120 mg  120 mg Oral Daily Opyd, [name] Qua, MD      . fentaNYL (SUBLIMAZE) injection 25 mcg  25 mcg Intravenous Q2H PRN Opyd, [name] Qua, MD      . fluticasone (FLONASE) 50 MCG/ACT nasal spray 2 spray   2 spray Each Nare Daily Opyd, [name] Qua, MD      . heparin ADULT infusion 100 units/mL (25000 units/237mL sodium chloride 0.45%)  600 Units/hr Intravenous Continuous [name] Aloe, MD      . LORazepam (ATIVAN) tablet 1 mg  1 mg Oral QHS Opyd, [name] Qua, MD   1 mg at 05/31/16 2252  . metoprolol tartrate (LOPRESSOR) tablet 50 mg  50 mg Oral BID Opyd, [name] Qua, MD   50 mg at 05/31/16 2251  . multivitamin with minerals tablet 1 tablet  1 tablet Oral Daily Opyd, [name] Qua, MD      . omega-3 acid ethyl esters (LOVAZA) capsule 1 g  1 g Oral Daily Opyd, [name] Qua, MD      . ondansetron (ZOFRAN) injection 4 mg  4 mg Intravenous Q6H PRN Opyd, [name] Qua, MD  4 mg at 06/01/16 0646  . polyethylene glycol (MIRALAX / GLYCOLAX) packet 17 g  17 g Oral Daily PRN Opyd, [name] Qua, MD      . polyvinyl alcohol (LIQUIFILM TEARS) 1.4 % ophthalmic solution 1 drop  1 drop Both Eyes PRN Opyd, [name] Qua, MD      . potassium chloride (K-DUR) CR tablet 10 mEq  10 mEq Oral Daily Opyd, [name] Qua, MD      . promethazine (PHENERGAN) injection 6.25 mg  6.25 mg Intravenous Q6H PRN Opyd, [name] Qua, MD   6.25 mg at 06/01/16 0907  . sucralfate (CARAFATE) tablet 1 g  1 g Oral TID AC & HS Opyd, [name] Qua, MD      . traMADol (ULTRAM) tablet 50-100 mg  50-100 mg Oral TID PRN Vianne Bulls, MD   50 mg at 06/01/16 0907    Allergies as of 05/31/2016  . (No Known Allergies)    Family History  Problem Relation Age of Onset  . Heart disease Father     Dropsy  . Pneumonia Mother   . Sudden death Daughter 98  . Heart attack Son 30    Social History   Social History  . Marital status: Married    Spouse name: N/A  . Number of children: 4  . Years of education: N/A   Occupational History  . Not on file.   Social History Main Topics  . Smoking status: Never Smoker  . Smokeless tobacco: Never Used  . Alcohol use No  . Drug use: Unknown  . Sexual activity: Not on file   Other Topics Concern  . Not on file   Social  History Narrative   Lives alone.      Review of Systems: All systems reviewed and negative except where noted in HPI.  Physical Exam: Vital signs in last 24 hours: Temp:  [97.8 F (36.6 C)-98.9 F (37.2 C)] 97.8 F (36.6 C) (05/08 0540) Pulse Rate:  [64-98] 64 (05/08 0540) Resp:  [12-18] 16 (05/08 0540) BP: (136-165)/(54-83) 154/83 (05/08 0540) SpO2:  [94 %-100 %] 99 % (05/08 0540) Weight:  [118 lb (53.5 kg)] 118 lb (53.5 kg) (05/07 1306) Last BM Date: 05/31/16 General:   White female lying in bed in NAD. Too drowsy from pain meds to stay awake. Mumbles in response to questions.  Head:  Normocephalic and atraumatic. Eyes:  Sclera clear, no icterus.   Conjunctiva pink. Ears:  Normal auditory acuity. Nose:  No deformity, discharge,  or lesions.  Neck:  Supple; no masses  Lungs:  Normal respiratory effort Heart:  Regular rate and rhythm, no edema Abdomen:  Soft,nontender, BS active,no palp mass or hsm.   Rectal:  Deferred  Msk:  Symmetrical without gross deformities. . Pulses:  Normal pulses noted. Extremities:  Without edema. Neurologic:  Tries to follow commands. . Skin:  Intact without significant lesions or rashes.. Psych:  Very drowsy but tries to cooperate.  Intake/Output from previous day: 05/07 0701 - 05/08 0700 In: 2423.1 [P.O.:360; I.V.:1063.1; IV Piggyback:1000] Out: -  Intake/Output this shift: No intake/output data recorded.  Lab Results:  Recent Labs  05/31/16 1308 06/01/16 0530  WBC 9.0 13.0*  HGB 9.8* 8.6*  HCT 29.2* 24.9*  PLT 152 136*   BMET  Recent Labs  05/31/16 1308 06/01/16 0530  NA 139 137  K 3.3* 3.7  CL 106 110  CO2 25 22  GLUCOSE 99 86  BUN 22* 18  CREATININE 0.96 0.82  CALCIUM 8.9  7.8*   LFT  Recent Labs  06/01/16 0530  PROT 5.5*  ALBUMIN 2.7*  AST 120*  ALT 385*  ALKPHOS 454*  BILITOT 1.3*   PT/INR No results for input([name]): LABPROT, INR in the last 72 hours. Hepatitis Panel  Recent Labs  05/31/16 1523    HEPBSAG Negative  HCVAB <0.1  HEPAIGM Negative  HEPBIGM Negative    Studies/Results: Ct Abdomen Pelvis W Contrast  Result Date: 05/31/2016 CLINICAL DATA:  Acute abdominal pain, vomiting, diarrhea, elevated LFTs, prior cholecystectomy EXAM: CT ABDOMEN AND PELVIS WITH CONTRAST TECHNIQUE: Multidetector CT imaging of the abdomen and pelvis was performed using the standard protocol following bolus administration of intravenous contrast. CONTRAST:  80 cc Isovue-300 COMPARISON:  None available FINDINGS: Lower chest: Bibasilar scarring/ atelectasis. Cardiomegaly evident. No pericardial or pleural effusion. Pacer wires noted in the right heart. Extensive atherosclerosis of the descending thoracic aorta. Hepatobiliary: Remote cholecystectomy. Intrahepatic biliary dilatation in the left hepatic lobe extends to the biliary confluence where there is rounded soft tissue/nodular attenuation, images 17 through 19. Differential considerations include upper biliary tree choledocholithiasis resulting in the ductal obstruction. Difficult to exclude biliary stricture or neoplastic process such as cholangiocarcinoma at this level. Minor right biliary prominence. Common bile duct is dilated but extends all the way to the ampulla and may be chronically dilated related to the cholecystectomy. Pancreas is diffusely thin atrophic without appreciable mass or focal abnormality. Pancreas: Diffuse atrophy. No ductal dilatation. No surrounding inflammatory process or fluid collection. Spleen: Normal in size without focal abnormality. Adrenals/Urinary Tract: Normal adrenal glands for age. Kidneys demonstrate normal symmetric enhancement. No obstructing ureteral calculus or hydroureter. Urinary bladder grossly unremarkable. Nonobstructing punctate left nephrolithiasis measures 5 mm, image 25. Stomach/Bowel: Negative for bowel obstruction, significant dilatation, ileus, or free air. Colonic diverticulosis noted. Large volume of stool in the  rectum, suggest fecal impaction. No fluid collection or abscess. Vascular/Lymphatic: Extensive atherosclerosis of the aorta and abdominal vasculature. Negative for aneurysm. No occlusive process. Iliac vessels are heavily calcified and tortuous but remain patent. No adenopathy. Reproductive: Remote hysterectomy. No adnexal mass. No pelvic free fluid or fluid collection. Other: No inguinal abnormality.  Intact abdominal wall. Musculoskeletal: Bones are osteopenic. Advanced degenerative spondylosis of the spine. Chronic changes of the lumbar spine with deformity at at L4-5. No acute osseous finding. IMPRESSION: Diffuse extensive left intrahepatic biliary dilatation extends to the biliary confluence. Central left hepatic duct nodular ill-defined soft tissue attenuation may represent upper biliary tree choledocholithiasis versus underlying intra biliary abnormality or mass such as cholangiocarcinoma. Mild biliary prominence of the right hepatic ducts and mild dilatation of the common bile duct may be secondary to a remote cholecystectomy. Consider further evaluation with GI consultation. Extensive abdominal and pelvic atherosclerosis Nonobstructing left nephrolithiasis Colonic diverticulosis Electronically Signed   By: Jerilynn Mages.  Shick M.D.   On: 05/31/2016 17:53    Tye Savoy, NP-C @  06/01/2016, 9:33 AM  Pager number [phone]  GI ATTENDING  History, laboratories, x-rays, prior endoscopy reports reviewed. Patient personally seen and examined. Daughter in room. Agree with comprehensive consultation note as outlined above. Highly functional 83 year old female who is in her usual state of health until 5 days ago when she developed problems with nausea, vomiting, and diarrhea. No other complaints. Felt better couple days later but subsequently developed generalized malaise. Some lower abdominal cramping. Low-grade fever per daughter (99). GI history is remarkable for Billroth II for remote gastric cancer. As well,  choledocholithiasis which I performed ERCP with sphincterotomy post Billroth II  anatomy in 2002. Multiple common duct stones cleared. Tight benign stricture in the left hepatic duct with upstream dilation and cholelithiasis above. Now asked to see for elevated liver tests and abnormal imaging which shows dilation of the left ductal system and questionable mass versus stones. CT also shows fecal impaction. A bit somnolent from medications but otherwise doing well. She actually remembered me from 2002. She is on chronic elliquis. Mild leukocytosis. Liver tests were okay 2 years ago.  IMPRESSION 1. Dilated left intrahepatic secondary to known left hepatic duct stricture remotely. Unable to clear duct previously. Current symptom complex possibly do this issue. Management of this problem would require tertiary endoscopy, which may or may not work. If so, may require multiple sessions. She is not appropriate for left liver surgery at her age. Could consider percutaneous drainage procedure. Not a candidate for diagnostic MRCP secondary to pacemaker. Would not perform ERCP without therapeutic intent. Discussed all these issues with the patient and her daughter. Conservative measures clearly desired by the patient and her family at this time. I support this. 2. Remote history of gastric cancer status post Billroth II 3. Fecal impaction. I suspect this accounts for her lower abdominal discomfort 4. Multiple medical problems RECOMMENDATIONS 1. Empiric treatment with ciprofloxacin. 400 mg IV every 12 for now 2. Trend LFTs and white count 3. Monitor clinical progress 4. MiraLAX and enemas for fecal impaction  Will follow.  [name] Chuck. Geri Seminole., M.D. [facility] Division of Gastroenterology

## 2016-06-01 NOTE — Progress Notes (Addendum)
ANTICOAGULATION CONSULT NOTE - Follow Up Consult  Pharmacy Consult for Heparin Indication: atrial fibrillation  No Known Allergies  Patient Measurements: Height: 5\' 5"  (165.1 cm) Weight: 118 lb (53.5 kg) IBW/kg (Calculated) : 57 Heparin Dosing Weight:   Vital Signs: Temp: 97.8 F (36.6 C) (05/08 0540) Temp Source: Oral (05/08 0540) BP: 154/83 (05/08 0540) Pulse Rate: 64 (05/08 0540)  Labs:  Recent Labs  05/31/16 1308 05/31/16 2150 06/01/16 0530  HGB 9.8*  --  8.6*  HCT 29.2*  --  24.9*  PLT 152  --  136*  APTT  --  >200* >200*  HEPARINUNFRC  --  1.76* 1.38*  CREATININE 0.96  --  0.82    Estimated Creatinine Clearance: 33.9 mL/min (by C-G formula based on SCr of 0.82 mg/dL).   Medications:  Infusions:  . heparin 750 Units/hr (05/31/16 2115)    Assessment: PTT >200 sec, HL 1.38   RN thinks lab could have been drawn from infusing heparin, therefore I will repeat labs and continue heparin at current rate until STAT repeat results.  No other heparin issues per RN.   Goal of Therapy:  Heparin level 0.3-0.7 units/ml aPTT 66-102 seconds Monitor platelets by anticoagulation protocol: Yes   Plan:  Now, lab has confirmed where lab was drawn and I believe the levels to be accurate.  Will hold heparin ~1hr and then resume heparin at 600 units/hr with repeat heparin level and PTT at [address] [place] 06/01/2016,6:46 AM

## 2016-06-02 DIAGNOSIS — N179 Acute kidney failure, unspecified: Secondary | ICD-10-CM

## 2016-06-02 LAB — CBC
HCT: 26.9 % — ABNORMAL LOW (ref 36.0–46.0)
Hemoglobin: 9.1 g/dL — ABNORMAL LOW (ref 12.0–15.0)
MCH: 33.3 pg (ref 26.0–34.0)
MCHC: 33.8 g/dL (ref 30.0–36.0)
MCV: 98.5 fL (ref 78.0–100.0)
Platelets: 94 10*3/uL — ABNORMAL LOW (ref 150–400)
RBC: 2.73 MIL/uL — ABNORMAL LOW (ref 3.87–5.11)
RDW: 14 % (ref 11.5–15.5)
WBC: 28.5 10*3/uL — ABNORMAL HIGH (ref 4.0–10.5)

## 2016-06-02 LAB — BLOOD CULTURE ID PANEL (REFLEXED)
Acinetobacter baumannii: NOT DETECTED
CANDIDA ALBICANS: NOT DETECTED
CANDIDA TROPICALIS: NOT DETECTED
Candida glabrata: NOT DETECTED
Candida krusei: NOT DETECTED
Candida parapsilosis: NOT DETECTED
Carbapenem resistance: NOT DETECTED
ENTEROBACTER CLOACAE COMPLEX: NOT DETECTED
ENTEROCOCCUS SPECIES: NOT DETECTED
Enterobacteriaceae species: DETECTED — AB
Escherichia coli: DETECTED — AB
HAEMOPHILUS INFLUENZAE: NOT DETECTED
Klebsiella oxytoca: NOT DETECTED
Klebsiella pneumoniae: NOT DETECTED
LISTERIA MONOCYTOGENES: NOT DETECTED
NEISSERIA MENINGITIDIS: NOT DETECTED
PROTEUS SPECIES: NOT DETECTED
Pseudomonas aeruginosa: NOT DETECTED
SERRATIA MARCESCENS: NOT DETECTED
STAPHYLOCOCCUS SPECIES: NOT DETECTED
STREPTOCOCCUS AGALACTIAE: NOT DETECTED
STREPTOCOCCUS PNEUMONIAE: NOT DETECTED
STREPTOCOCCUS SPECIES: NOT DETECTED
Staphylococcus aureus (BCID): NOT DETECTED
Streptococcus pyogenes: NOT DETECTED

## 2016-06-02 LAB — COMPREHENSIVE METABOLIC PANEL
ALBUMIN: 2.3 g/dL — AB (ref 3.5–5.0)
ALT: 298 U/L — ABNORMAL HIGH (ref 14–54)
ANION GAP: 8 (ref 5–15)
AST: 142 U/L — ABNORMAL HIGH (ref 15–41)
Alkaline Phosphatase: 450 U/L — ABNORMAL HIGH (ref 38–126)
BUN: 25 mg/dL — ABNORMAL HIGH (ref 6–20)
CO2: 18 mmol/L — AB (ref 22–32)
Calcium: 7.3 mg/dL — ABNORMAL LOW (ref 8.9–10.3)
Chloride: 110 mmol/L (ref 101–111)
Creatinine, Ser: 1.41 mg/dL — ABNORMAL HIGH (ref 0.44–1.00)
GFR calc non Af Amer: 30 mL/min — ABNORMAL LOW (ref >60)
GFR, EST AFRICAN AMERICAN: 35 mL/min — AB (ref >60)
GLUCOSE: 87 mg/dL (ref 65–99)
POTASSIUM: 3.9 mmol/L (ref 3.5–5.1)
SODIUM: 136 mmol/L (ref 135–145)
TOTAL PROTEIN: 5 g/dL — AB (ref 6.5–8.1)
Total Bilirubin: 2.6 mg/dL — ABNORMAL HIGH (ref 0.3–1.2)

## 2016-06-02 LAB — HEPARIN LEVEL (UNFRACTIONATED): HEPARIN UNFRACTIONATED: 0.8 [IU]/mL — AB (ref 0.30–0.70)

## 2016-06-02 LAB — APTT: aPTT: 77 seconds — ABNORMAL HIGH (ref 24–36)

## 2016-06-02 MED ORDER — DEXTROSE 5 % IV SOLN
2.0000 g | INTRAVENOUS | Status: DC
  Administered 2016-06-03 – 2016-06-04 (×2): 2 g via INTRAVENOUS
  Filled 2016-06-02 (×3): qty 2

## 2016-06-02 MED ORDER — POTASSIUM CHLORIDE CRYS ER 10 MEQ PO TBCR
10.0000 meq | EXTENDED_RELEASE_TABLET | Freq: Every day | ORAL | Status: DC
  Filled 2016-06-02: qty 1

## 2016-06-02 MED ORDER — APIXABAN 2.5 MG PO TABS
2.5000 mg | ORAL_TABLET | Freq: Two times a day (BID) | ORAL | Status: DC
  Administered 2016-06-02: 2.5 mg via ORAL
  Filled 2016-06-02: qty 1

## 2016-06-02 MED ORDER — CIPROFLOXACIN IN D5W 400 MG/200ML IV SOLN: 400.0000 mg | INTRAVENOUS | Status: DC

## 2016-06-02 MED ORDER — METRONIDAZOLE IN NACL 5-0.79 MG/ML-% IV SOLN
500.0000 mg | Freq: Three times a day (TID) | INTRAVENOUS | Status: DC
  Administered 2016-06-02 – 2016-06-04 (×6): 500 mg via INTRAVENOUS
  Filled 2016-06-02 (×6): qty 100

## 2016-06-02 NOTE — Progress Notes (Signed)
[facility] Progress Note  Chief Complaint:   Biliary obstruction  Subjective: No abdominal pain or nausea today. Consumed liquid breakfast.   Objective:  Vital signs in last 24 hours: Temp:  [98 F (36.7 C)-98.4 F (36.9 C)] 98.4 F (36.9 C) (05/09 0512) Pulse Rate:  [83-91] 89 (05/09 0512) Resp:  [16-18] 18 (05/09 0512) BP: (109-157)/(50-68) 111/56 (05/09 0512) SpO2:  [96 %-98 %] 96 % (05/09 0512) Last BM Date: 05/31/16 General:   Alert,white female in NAD EENT:  Normal hearing, non icteric sclera, conjunctive pink.  Heart:  Regular rate and rhythm, no lower extremity edema Pulm: Normal respiratory effort Abdomen:  Soft, nondistended, nontender.  Normal bowel sounds, no masses felt. No hepatomegaly.    Neurologic:  Alert and  oriented x4;  grossly normal neurologically. Psych:  Alert and cooperative. Normal mood and affect.   Intake/Output from previous day: 05/08 0701 - 05/09 0700 In: 1977.7 [I.V.:1577.7; IV Piggyback:400] Out: -  Intake/Output this shift: No intake/output data recorded.  Lab Results:  Recent Labs  05/31/16 1308 06/01/16 0530 06/02/16 0352  WBC 9.0 13.0* 28.5*  HGB 9.8* 8.6* 9.1*  HCT 29.2* 24.9* 26.9*  PLT 152 136* 94*   BMET  Recent Labs  05/31/16 1308 06/01/16 0530 06/02/16 0352  NA 139 137 136  K 3.3* 3.7 3.9  CL 106 110 110  CO2 25 22 18*  GLUCOSE 99 86 87  BUN 22* 18 25*  CREATININE 0.96 0.82 1.41*  CALCIUM 8.9 7.8* 7.3*   LFT  Recent Labs  06/02/16 0352  PROT 5.0*  ALBUMIN 2.3*  AST 142*  ALT 298*  ALKPHOS 450*  BILITOT 2.6*     Recent Labs  05/31/16 1523  HEPBSAG Negative  HCVAB <0.1  HEPAIGM Negative  HEPBIGM Negative     Assessment / Plan: 91. 83 yo female with intrahepatic biliary obstruction. She has a known remote history of a left hepatic duct stricture with stones above stricture. Did fine for years, now admitted with abnormal LFTs, vomiting and lower abdominal pain. CTscan  reveals diffuse dilation of left intrahepatic duct. Suspect she has stones above the stricture again though neoplasm still on list of DDx. Unable to have MRCP due to pacemaker. -long discussion with patient and daughter yesterday. Treatment of stones would require tertiary transfer.  Family and patient prefer conservative care for now.  -Cipro started empirically for cholangitis yesterday. Her LFTs with exception of bilirubin have improved though she had a marked rise in WBC overnight.  -Considering percutaneous drainage of duct.  -tolerating liquids, will advance diet.  2. Leukocytosis. WBC up from 13 to 28 overnight. Afebrile.  -continue Cipro -am CBC, LFTs  3. AKI. Cr 0.82 >>> 1.41. Monitor closely. She is getting IVF at 100 cc/ hr  -am labs  4. Lower abdominal pain, resolved. Probably related to fecal impaction which has also been resolved. Continue Miralax as needed now.   Principal Problem:   Biliary obstruction Active Problems:   Atrial fibrillation ([facility])   Essential hypertension   Nausea & vomiting   Normocytic anemia    LOS: 2 days   Tye Savoy NP 06/02/2016, 9:43 AM Pager number [phone]  GI ATTENDING  Interval history data reviewed. Patient personally seen and examined. Agree with interval progress note as outlined above. Increased white blood cell count without fever. Has been confused today. Abdomen benign. Fecal impaction resolved. Agree with continuing antibiotic therapy and close monitoring. Limited options given age and known anatomy. Percutaneous  drainage is a possible option. We'll follow  Docia Chuck. Geri Seminole., M.D. [facility] Division of Gastroenterology

## 2016-06-02 NOTE — Progress Notes (Signed)
PHARMACY - PHYSICIAN COMMUNICATION CRITICAL VALUE ALERT - BLOOD CULTURE IDENTIFICATION (BCID)  Results for orders placed or performed during the hospital encounter of 05/31/16  Blood Culture ID Panel (Reflexed) (Collected: 06/01/2016  5:39 PM)  Result Value Ref Range   Enterococcus species NOT DETECTED NOT DETECTED   Listeria monocytogenes NOT DETECTED NOT DETECTED   Staphylococcus species NOT DETECTED NOT DETECTED   Staphylococcus aureus NOT DETECTED NOT DETECTED   Streptococcus species NOT DETECTED NOT DETECTED   Streptococcus agalactiae NOT DETECTED NOT DETECTED   Streptococcus pneumoniae NOT DETECTED NOT DETECTED   Streptococcus pyogenes NOT DETECTED NOT DETECTED   Acinetobacter baumannii NOT DETECTED NOT DETECTED   Enterobacteriaceae species DETECTED (A) NOT DETECTED   Enterobacter cloacae complex NOT DETECTED NOT DETECTED   Escherichia coli DETECTED (A) NOT DETECTED   Klebsiella oxytoca NOT DETECTED NOT DETECTED   Klebsiella pneumoniae NOT DETECTED NOT DETECTED   Proteus species NOT DETECTED NOT DETECTED   Serratia marcescens NOT DETECTED NOT DETECTED   Carbapenem resistance NOT DETECTED NOT DETECTED   Haemophilus influenzae NOT DETECTED NOT DETECTED   Neisseria meningitidis NOT DETECTED NOT DETECTED   Pseudomonas aeruginosa NOT DETECTED NOT DETECTED   Candida albicans NOT DETECTED NOT DETECTED   Candida glabrata NOT DETECTED NOT DETECTED   Candida krusei NOT DETECTED NOT DETECTED   Candida parapsilosis NOT DETECTED NOT DETECTED   Candida tropicalis NOT DETECTED NOT DETECTED    Name of physician (or Provider) Contacted: Arrien  Changes to prescribed antibiotics required: Change Cipro to Rocephin  Biagio Borg 06/02/2016  5:58 PM

## 2016-06-02 NOTE — Progress Notes (Signed)
PHARMACY NOTE:  ANTIMICROBIAL RENAL DOSAGE ADJUSTMENT  Current antimicrobial regimen includes a mismatch between antimicrobial dosage and estimated renal function.  As per policy approved by the Pharmacy & Therapeutics and Medical Executive Committees, the antimicrobial dosage will be adjusted accordingly.  Current antimicrobial dosage:  Cipro 400mg  IV q12h  Indication: Intra-abdominal infection  Renal Function:   Estimated Creatinine Clearance: 19.7 mL/min (A) (by C-G formula based on SCr of 1.41 mg/dL (H)). []      On intermittent HD, scheduled: []      On CRRT    Antimicrobial dosage has been changed to:  Cipro 400mg  IV q24 hr   Additional Comments: will monitor and adjust as needed   Thank you for allowing pharmacy to be a part of this patient's care.  Minda Ditto PharmD Pager [phone] 06/02/2016, 7:48 AM

## 2016-06-02 NOTE — Care Management Note (Signed)
Case Management Note  Patient Details  Name: [name] [name] DOBY MRN: [id number] Date of Birth: [birth year]  Subjective/Objective:    83 yo admitted with Biliary obstruction.                Action/Plan: Pt from home alone. This CM went over physical therapy recommendations with pt and daughter at the bedside. Pt states that she doesn't think she needs HHPT. Daughter states that herself and other daughter live very close by and assist her daily with care as needed. Pt has RW, 3in1 and shower chair at home.   Expected Discharge Date:   (unknown)               Expected Discharge Plan:  Home/Self Care  In-House Referral:     Discharge planning Services  CM Consult  Post Acute Care Choice:    Choice offered to:     DME Arranged:    DME Agency:     HH Arranged:    HH Agency:     Status of Service:  In process, will continue to follow  If discussed at Long Length of Stay Meetings, dates discussed:    Additional CommentsLynnell Catalan, RN 06/02/2016, 2:20 PM  [phone]

## 2016-06-02 NOTE — Evaluation (Signed)
Physical Therapy Evaluation Patient Details Name: [name] [name] MRN: [id number] DOB: Mar 01, 1917 Today's Date: 06/02/2016   History of Present Illness   [name] [name] is a 83 y.o. female with medical history significant for cancer of the stomach status post remote resection, tachybradycardia syndrome status post pacer placement, hypertension, and chronic atrial fibrillation on Eliquis who presents to the emergency department for evaluation of nausea and vomiting-->hepatic biliary obstruction.   Clinical Impression  Pt admitted with above diagnosis. Pt currently with functional limitations due to the deficits listed below (see PT Problem List). * Pt will benefit from skilled PT to increase their independence and safety with mobility to allow discharge to the venue listed below.   Pt very cooperative, dtr supportive and anbe to incr support at home initially when pt returns home      Follow Up Recommendations Home health PT;No PT follow up (vs-depending on progress/if pt at baseline)    Equipment Recommendations  None recommended by PT    Recommendations for Other Services       Precautions / Restrictions Precautions Precautions: Fall Restrictions Weight Bearing Restrictions: No      Mobility  Bed Mobility Overal bed mobility: Needs Assistance Bed Mobility: Rolling;Sidelying to Sit Rolling: Supervision Sidelying to sit: Min assist       General bed mobility comments: assist to elevate trunk   Transfers Overall transfer level: Needs assistance Equipment used: Rolling walker (2 wheeled) Transfers: Sit to/from Stand Sit to Stand: Min guard         General transfer comment: pt braces posterior LEs on bed to maintain balance, trunk forward flexed/hips flexed in standing  Ambulation/Gait Ambulation/Gait assistance: Min assist Ambulation Distance (Feet): 50 Feet Assistive device: Rolling walker (2 wheeled) Gait Pattern/deviations: Step-to pattern;Step-through  pattern;Decreased stride length;Trunk flexed     General Gait Details: cues for staying inside RW, incr trunk extension  Stairs            Wheelchair Mobility    Modified Rankin (Stroke Patients Only)       Balance Overall balance assessment: Needs assistance           Standing balance-Leahy Scale: Poor Standing balance comment: reliant on UEs in standing                             Pertinent Vitals/Pain Pain Assessment: Faces Faces Pain Scale: Hurts a little bit Pain Location: L ankle and heel--RN notified Pain Intervention(s): Monitored during session;Repositioned    Home Living Family/patient expects to be discharged to:: Private residence Living Arrangements: Alone Available Help at Discharge: Family;Available PRN/intermittently Type of Home: House       Home Layout: Able to live on main level with bedroom/bathroom;Laundry or work area in [place]: Environmental consultant - 2 wheels;Walker - 4 wheels Additional Comments: pt dtr does meals, meds, cleans house, otherwise pt is IND;     Prior Function Level of Independence: Independent;Independent with assistive device(s)               Hand Dominance        Extremity/Trunk Assessment   Upper Extremity Assessment Upper Extremity Assessment: Overall WFL for tasks assessed    Lower Extremity Assessment Lower Extremity Assessment: Overall WFL for tasks assessed    Cervical / Trunk Assessment Cervical / Trunk Assessment: Kyphotic Cervical / Trunk Exceptions: forward trunk flexion in standing  Communication   Communication: No difficulties  Cognition Arousal/Alertness: Awake/alert Behavior During  Therapy: WFL for tasks assessed/performed Overall Cognitive Status: Within Functional Limits for tasks assessed                                        General Comments      Exercises     Assessment/Plan    PT Assessment Patient needs continued PT services  PT  Problem List Decreased activity tolerance;Decreased mobility;Decreased balance       PT Treatment Interventions DME instruction;Gait training;Functional mobility training;Therapeutic activities;Therapeutic exercise;Balance training    PT Goals (Current goals can be found in the Care Plan section)  Acute Rehab PT Goals Patient Stated Goal: get better PT Goal Formulation: With patient/family Time For Goal Achievement: 06/09/16 Potential to Achieve Goals: Good    Frequency Min 3X/week   Barriers to discharge        Co-evaluation               AM-PAC PT "6 Clicks" Daily Activity  Outcome Measure Difficulty turning over in bed (including adjusting bedclothes, sheets and blankets)?: A Little Difficulty moving from lying on back to sitting on the side of the bed? : A Little Difficulty sitting down on and standing up from a chair with arms (e.g., wheelchair, bedside commode, etc,.)?: A Little Help needed moving to and from a bed to chair (including a wheelchair)?: A Little Help needed walking in hospital room?: A Little Help needed climbing 3-5 steps with a railing? : A Little 6 Click Score: 18    End of Session   Activity Tolerance: Patient tolerated treatment well Patient left: with call bell/phone within reach;in chair;with family/visitor present   PT Visit Diagnosis: Unsteadiness on feet (R26.81)    Time: 6237-6283 PT Time Calculation (min) (ACUTE ONLY): 25 min   Charges:   PT Evaluation $PT Eval Low Complexity: 1 Procedure PT Treatments $Gait Training: 8-22 mins   PT G CodesKenyon Ana, PT Pager: [phone] 06/02/2016   [facility] 06/02/2016, 12:35 PM

## 2016-06-02 NOTE — Progress Notes (Signed)
PROGRESS NOTE    TU [name] [name]  PXT:[id number] DOB: July 13, 1917 DOA: 05/31/2016 PCP: Crist Infante, MD    Brief Narrative:  83 yo female presents with the chief complain of nausea and vomiting. Patient know to have atrial fibrillation and remote history of gastric cancer sp resection (Bilroth II). Symptoms present for 4 days before admission. Had cholecystectomy in the past. On the initial examination, patient was hemodynamically stable, in pian with a nontender abdomen. AST -ALT were elevated at 580-200 with Alkaline phos 558 and bilirubins at 2.4. CT with positive intrahepatic biliary duct dilatation.    Assessment & Plan:   Principal Problem:   Biliary obstruction Active Problems:   Atrial fibrillation (HCC)   Essential hypertension   Nausea & vomiting   Normocytic anemia   1. Biliary obstruction. Unable to get MRCP due to pacemaker, not candidate for ERCP per gastroenterology, will continue conservative care, patient has remain asymptomatic, no nausea or vomiting, no abdominal pain. Noted stable elevation of liver enzymes and bilirubins. Due to worsening leukocytosis, will add metronidazole to ciprofloxacin. Follow on cell count and liver panel in am.   2. Atrial fibrillation . Will hold on heparin drip and will hold on apixaban for the next 24 hours, so far no surgical intervention planned. Continue rate control with diltiazem and metoprolol. Old records personally reviewed ekg from 04/18 with a paced. Will check new ekg.   3. AKI. Worsening renal function, with cr at 1,4 from 0.82, suspected to be pre-renal due to poor oral intake, will continue isotonic IV fluids at 100 ml per hour, will check urine electrolytes, avoid hypotension or nephrotoxic medications.   4. HTN. Blood pressure stable, continue diltiazem.   5.  Anemia. Multifactorial, no need for blood transfusion.      DVT prophylaxis: heparin  Code Status: full  Family Communication:  Disposition Plan: home    Consultants:   Gastroenterology     Procedures:     Antimicrobials:   Ciprofloxacin.     Subjective: Patient feeling better, tolerating po well, no nausea or vomiting, no further abdominal pain, no fever or chills. NO chest pain or dyspnea.   Objective: Vitals:   06/01/16 0540 06/01/16 1352 06/01/16 2033 06/02/16 0512  BP: (!) 154/83 (!) 157/68 (!) 109/50 (!) 111/56  Pulse: 64 91 83 89  Resp: 16 16 16 18   Temp: 97.8 F (36.6 C) 98.2 F (36.8 C) 98 F (36.7 C) 98.4 F (36.9 C)  TempSrc: Oral Oral Oral Oral  SpO2: 99% 97% 98% 96%  Weight:      Height:        Intake/Output Summary (Last 24 hours) at 06/02/16 0920 Last data filed at 06/02/16 0600  Gross per 24 hour  Intake          1977.67 ml  Output                0 ml  Net          1977.67 ml   Filed Weights   05/31/16 1306  Weight: 53.5 kg (118 lb)    Examination:  General exam: deconditioned E ENT: no pallor or icterus Respiratory system: Clear to auscultation. Respiratory effort normal. No wheezing, rales or rhonchi. Mild decreased breath sounds at bases.  Cardiovascular system: S1 & S2 heard, RRR. No JVD, murmurs, rubs, gallops or clicks. No pedal edema. Gastrointestinal system: Abdomen is nondistended, soft and nontender. No organomegaly or masses felt. Normal bowel sounds heard. Central nervous system: Alert and  oriented. No focal neurological deficits. Extremities: Symmetric 5 x 5 power. Skin: No rashes, lesions or ulcers     Data Reviewed: I have personally reviewed following labs and imaging studies  CBC:  Recent Labs Lab 05/31/16 1308 06/01/16 0530 06/02/16 0352  WBC 9.0 13.0* 28.5*  NEUTROABS  --  10.6*  --   HGB 9.8* 8.6* 9.1*  HCT 29.2* 24.9* 26.9*  MCV 98.6 98.0 98.5  PLT 152 136* 94*   Basic Metabolic Panel:  Recent Labs Lab 05/31/16 1308 06/01/16 0530 06/02/16 0352  NA 139 137 136  K 3.3* 3.7 3.9  CL 106 110 110  CO2 25 22 18*  GLUCOSE 99 86 87  BUN 22* 18 25*   CREATININE 0.96 0.82 1.41*  CALCIUM 8.9 7.8* 7.3*   GFR: Estimated Creatinine Clearance: 19.7 mL/min (A) (by C-G formula based on SCr of 1.41 mg/dL (H)). Liver Function Tests:  Recent Labs Lab 05/31/16 1308 06/01/16 0530 06/02/16 0352  AST 200* 120* 142*  ALT 580* 385* 298*  ALKPHOS 558* 454* 450*  BILITOT 2.4* 1.3* 2.6*  PROT 6.3* 5.5* 5.0*  ALBUMIN 3.2* 2.7* 2.3*    Recent Labs Lab 05/31/16 1523  LIPASE <10*   No results for input(s): AMMONIA in the last 168 hours. Coagulation Profile: No results for input(s): INR, PROTIME in the last 168 hours. Cardiac Enzymes: No results for input(s): CKTOTAL, CKMB, CKMBINDEX, TROPONINI in the last 168 hours. BNP (last 3 results) No results for input(s): PROBNP in the last 8760 hours. HbA1C: No results for input(s): HGBA1C in the last 72 hours. CBG: No results for input(s): GLUCAP in the last 168 hours. Lipid Profile: No results for input(s): CHOL, HDL, LDLCALC, TRIG, CHOLHDL, LDLDIRECT in the last 72 hours. Thyroid Function Tests: No results for input(s): TSH, T4TOTAL, FREET4, T3FREE, THYROIDAB in the last 72 hours. Anemia Panel: No results for input(s): VITAMINB12, FOLATE, FERRITIN, TIBC, IRON, RETICCTPCT in the last 72 hours. Sepsis Labs: No results for input(s): PROCALCITON, LATICACIDVEN in the last 168 hours.  No results found for this or any previous visit (from the past 240 hour(s)).       Radiology Studies: Ct Abdomen Pelvis W Contrast  Result Date: 05/31/2016 CLINICAL DATA:  Acute abdominal pain, vomiting, diarrhea, elevated LFTs, prior cholecystectomy EXAM: CT ABDOMEN AND PELVIS WITH CONTRAST TECHNIQUE: Multidetector CT imaging of the abdomen and pelvis was performed using the standard protocol following bolus administration of intravenous contrast. CONTRAST:  80 cc Isovue-300 COMPARISON:  None available FINDINGS: Lower chest: Bibasilar scarring/ atelectasis. Cardiomegaly evident. No pericardial or pleural  effusion. Pacer wires noted in the right heart. Extensive atherosclerosis of the descending thoracic aorta. Hepatobiliary: Remote cholecystectomy. Intrahepatic biliary dilatation in the left hepatic lobe extends to the biliary confluence where there is rounded soft tissue/nodular attenuation, images 17 through 19. Differential considerations include upper biliary tree choledocholithiasis resulting in the ductal obstruction. Difficult to exclude biliary stricture or neoplastic process such as cholangiocarcinoma at this level. Minor right biliary prominence. Common bile duct is dilated but extends all the way to the ampulla and may be chronically dilated related to the cholecystectomy. Pancreas is diffusely thin atrophic without appreciable mass or focal abnormality. Pancreas: Diffuse atrophy. No ductal dilatation. No surrounding inflammatory process or fluid collection. Spleen: Normal in size without focal abnormality. Adrenals/Urinary Tract: Normal adrenal glands for age. Kidneys demonstrate normal symmetric enhancement. No obstructing ureteral calculus or hydroureter. Urinary bladder grossly unremarkable. Nonobstructing punctate left nephrolithiasis measures 5 mm, image 25. Stomach/Bowel: Negative for  bowel obstruction, significant dilatation, ileus, or free air. Colonic diverticulosis noted. Large volume of stool in the rectum, suggest fecal impaction. No fluid collection or abscess. Vascular/Lymphatic: Extensive atherosclerosis of the aorta and abdominal vasculature. Negative for aneurysm. No occlusive process. Iliac vessels are heavily calcified and tortuous but remain patent. No adenopathy. Reproductive: Remote hysterectomy. No adnexal mass. No pelvic free fluid or fluid collection. Other: No inguinal abnormality.  Intact abdominal wall. Musculoskeletal: Bones are osteopenic. Advanced degenerative spondylosis of the spine. Chronic changes of the lumbar spine with deformity at at L4-5. No acute osseous finding.  IMPRESSION: Diffuse extensive left intrahepatic biliary dilatation extends to the biliary confluence. Central left hepatic duct nodular ill-defined soft tissue attenuation may represent upper biliary tree choledocholithiasis versus underlying intra biliary abnormality or mass such as cholangiocarcinoma. Mild biliary prominence of the right hepatic ducts and mild dilatation of the common bile duct may be secondary to a remote cholecystectomy. Consider further evaluation with GI consultation. Extensive abdominal and pelvic atherosclerosis Nonobstructing left nephrolithiasis Colonic diverticulosis Electronically Signed   By: Jerilynn Mages.  Shick M.D.   On: 05/31/2016 17:53        Scheduled Meds: . calcium-vitamin D  1 tablet Oral Q breakfast  . [START ON 06/05/2016] Cyanocobalamin  500 mcg Each Nare Once per day on Sat  . diltiazem  120 mg Oral Daily  . fluticasone  2 spray Each Nare Daily  . LORazepam  1 mg Oral QHS  . metoprolol  50 mg Oral BID  . multivitamin with minerals  1 tablet Oral Daily  . omega-3 acid ethyl esters  1 g Oral Daily  . potassium chloride  10 mEq Oral Daily   Continuous Infusions: . sodium chloride 100 mL/hr at 06/02/16 0324  . [START ON 06/03/2016] ciprofloxacin    . heparin 450 Units/hr (06/01/16 2031)     LOS: 2 days       Mauricio Gerome Apley, MD [facility] Pager [phone]  If 7PM-7AM, please contact night-coverage [email or web address] Password [email or web address] 06/02/2016, 9:20 AM

## 2016-06-02 NOTE — Progress Notes (Signed)
ANTICOAGULATION CONSULT NOTE - Follow Up Consult  Pharmacy Consult for Heparin Indication: atrial fibrillation  No Known Allergies  Patient Measurements: Height: 5\' 5"  (165.1 cm) Weight: 118 lb (53.5 kg) IBW/kg (Calculated) : 57 Heparin Dosing Weight:   Vital Signs: Temp: 98.4 F (36.9 C) (05/09 0512) Temp Source: Oral (05/09 0512) BP: 111/56 (05/09 0512) Pulse Rate: 89 (05/09 0512)  Labs:  Recent Labs  05/31/16 1308  06/01/16 0530 06/01/16 1735 06/02/16 0352  HGB 9.8*  --  8.6*  --   --   HCT 29.2*  --  24.9*  --   --   PLT 152  --  136*  --   --   APTT  --   < > >200* 136* 77*  HEPARINUNFRC  --   < > 1.38* 1.00* 0.80*  CREATININE 0.96  --  0.82  --  1.41*  < > = values in this interval not displayed.  Estimated Creatinine Clearance: 19.7 mL/min (A) (by C-G formula based on SCr of 1.41 mg/dL (H)).   Medications:  Infusions:  . sodium chloride 100 mL/hr at 06/02/16 0324  . ciprofloxacin 400 mg (06/02/16 0413)  . heparin 450 Units/hr (06/01/16 2031)    Assessment: Patient with heparin level above goal but PTT at goal.  No heparin issues per RN. PTT ordered with Heparin level until both correlate due to possible drug-lab interaction between oral anticoagulant (rivaroxaban, edoxaban, or apixaban) and anti-Xa level (aka heparin level)  Goal of Therapy:  Heparin level 0.3-0.7 units/ml aPTT 66-102 seconds Monitor platelets by anticoagulation protocol: Yes   Plan:  Continue heparin drip at current rate Recheck level at [employer], Jefferson Crowford 06/02/2016,5:28 AM

## 2016-06-03 DIAGNOSIS — A419 Sepsis, unspecified organism: Secondary | ICD-10-CM

## 2016-06-03 DIAGNOSIS — A4151 Sepsis due to Escherichia coli [E. coli]: Principal | ICD-10-CM

## 2016-06-03 LAB — CBC
HCT: 28.2 % — ABNORMAL LOW (ref 36.0–46.0)
Hemoglobin: 9.8 g/dL — ABNORMAL LOW (ref 12.0–15.0)
MCH: 33.1 pg (ref 26.0–34.0)
MCHC: 34.8 g/dL (ref 30.0–36.0)
MCV: 95.3 fL (ref 78.0–100.0)
Platelets: 123 10*3/uL — ABNORMAL LOW (ref 150–400)
RBC: 2.96 MIL/uL — ABNORMAL LOW (ref 3.87–5.11)
RDW: 13.9 % (ref 11.5–15.5)
WBC: 34.3 10*3/uL — ABNORMAL HIGH (ref 4.0–10.5)

## 2016-06-03 LAB — BASIC METABOLIC PANEL
Anion gap: 6 (ref 5–15)
BUN: 33 mg/dL — AB (ref 6–20)
CHLORIDE: 111 mmol/L (ref 101–111)
CO2: 19 mmol/L — ABNORMAL LOW (ref 22–32)
CREATININE: 1.35 mg/dL — AB (ref 0.44–1.00)
Calcium: 7.2 mg/dL — ABNORMAL LOW (ref 8.9–10.3)
GFR calc Af Amer: 37 mL/min — ABNORMAL LOW (ref >60)
GFR, EST NON AFRICAN AMERICAN: 32 mL/min — AB (ref >60)
Glucose, Bld: 98 mg/dL (ref 65–99)
Potassium: 3.6 mmol/L (ref 3.5–5.1)
Sodium: 136 mmol/L (ref 135–145)

## 2016-06-03 LAB — HEPATIC FUNCTION PANEL
ALT: 245 U/L — ABNORMAL HIGH (ref 14–54)
AST: 135 U/L — ABNORMAL HIGH (ref 15–41)
Albumin: 2.1 g/dL — ABNORMAL LOW (ref 3.5–5.0)
Alkaline Phosphatase: 433 U/L — ABNORMAL HIGH (ref 38–126)
BILIRUBIN DIRECT: 0.7 mg/dL — AB (ref 0.1–0.5)
BILIRUBIN INDIRECT: 0.6 mg/dL (ref 0.3–0.9)
TOTAL PROTEIN: 4.8 g/dL — AB (ref 6.5–8.1)
Total Bilirubin: 1.3 mg/dL — ABNORMAL HIGH (ref 0.3–1.2)

## 2016-06-03 NOTE — Consult Note (Signed)
Chief Complaint: Patient was seen in consultation today for percutaneous transhepatic cholangiogram with biliary drain placement Chief Complaint  Patient presents with  . Emesis  . Diarrhea     Referring Physician(s): Perry,J  Supervising Physician: Daryll Brod  Patient Status: [name]  History of Present Illness: [name] [name] is a 83 y.o. 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 She was recently admitted with abdominal pain and vomiting as well as diarrhea/weight loss. Lab work showed abnormal LFTs. CT abdomen /pelvis revealed:  Diffuse extensive left intrahepatic biliary dilatation extends to the biliary confluence. Central left hepatic duct nodular ill-defined soft tissue attenuation may represent upper biliary tree choledocholithiasis versus underlying intra biliary abnormality or mass such as cholangiocarcinoma. Mild biliary prominence of the right hepatic ducts and mild dilatation of the common bile duct may be secondary to a remote cholecystectomy. Extensive abdominal and pelvic atherosclerosis Nonobstructing left nephrolithiasis Colonic diverticulosis  GI service has consulted on the patient and request now received for PTC with biliary drainage. Past Medical History:  Diagnosis Date  . Atrial fibrillation ([place])    a. on Eliquis  . Hypertension   . Stomach cancer ([place])    1992  . Tachy-brady syndrome ([place])    a. s/p STJ dual chamber PPM implant    Past Surgical History:  Procedure  Laterality Date  . BACK SURGERY    . CHOLECYSTECTOMY    . PARTIAL GASTRECTOMY  1992  . PERMANENT PACEMAKER INSERTION N/A 05/16/2014   STJ Assurity dual chamber pacemaker implanted by Dr Lovena Le  . TOTAL ABDOMINAL HYSTERECTOMY      Allergies: Patient has no known allergies.  Medications: Prior to Admission medications   Medication Sig Start Date End Date Taking? Authorizing Provider  acetaminophen (TYLENOL) 500 MG tablet Take 500 mg by mouth every 6 (six) hours as needed for mild pain.   Yes [provider]  atorvastatin (LIPITOR) 20 MG tablet TAKE 1 TABLET (20 MG TOTAL) BY MOUTH AT BEDTIME. 05/12/16  Yes Evans Lance, MD  calcium-vitamin D (OSCAL WITH D) 500-200 MG-UNIT per tablet Take 1 tablet by mouth daily with breakfast.   Yes [provider]  Collagenase (SANTYL EX) Apply 1 application topically daily.   Yes [provider]  Cyanocobalamin (NASCOBAL NA) Place 1 spray into both nostrils once a week. saturday   Yes [provider]  denosumab (PROLIA) 60 MG/ML SOLN injection Inject 60 mg into the skin every 6 (six) months. Administer in upper arm, thigh, or abdomen   Yes [provider]  diltiazem (CARDIZEM CD) 120 MG 24 hr capsule TAKE 1 CAPSULE (120 MG TOTAL) BY MOUTH DAILY. 06/04/15  Yes Barrett, Rhonda G, PA-C  ELIQUIS 2.5 MG TABS tablet TAKE 1 TABLET BY MOUTH TWICE A DAY 11/25/15  Yes Barrett, Rhonda G, PA-C  ferrous sulfate (SLOW FE) 160 (50 FE) MG TBCR SR tablet Take 160 mg by mouth daily.   Yes [provider]  hydrochlorothiazide (HYDRODIURIL) 25 MG tablet Take 12.5 mg by mouth daily.   Yes [provider]  KLOR-CON 10 10 MEQ tablet  Take 10 mEq by mouth daily. 02/17/15  Yes [provider]  LORazepam (ATIVAN) 1 MG tablet Take 1 mg by mouth at bedtime.   Yes [provider]  metoprolol (LOPRESSOR) 50 MG tablet Take 50 mg by mouth 2 (two) times daily.   Yes [provider]  mometasone  (NASONEX) 50 MCG/ACT nasal spray Place 2 sprays into both nostrils daily.   Yes [provider]  Multiple Vitamins-Minerals (MULTIVITAMIN WITH MINERALS) tablet Take 1 tablet by mouth daily.   Yes [provider]  NASCOBAL 500 MCG/0.1ML SOLN Place 500 mcg into both nostrils as directed. 04/14/15  Yes [provider]  Omega-3 Fatty Acids (FISH OIL) 1000 MG CPDR Take 1,000 mg by mouth daily.   Yes [provider]  Polyethyl Glycol-Propyl Glycol 0.4-0.3 % SOLN Apply 1 drop to eye daily as needed (dryness).   Yes [provider]  potassium chloride (K-DUR,KLOR-CON) 10 MEQ tablet Take 10 mEq by mouth daily.   Yes [provider]  sucralfate (CARAFATE) 1 G tablet Take 1 g by mouth 4 (four) times daily -  before meals and at bedtime.   Yes [provider]  traMADol (ULTRAM) 50 MG tablet Take 50-100 mg by mouth 3 (three) times daily as needed (pain).   Yes [provider]  Vitamin D, Ergocalciferol, (DRISDOL) 50000 UNITS CAPS capsule Take 1 capsule by mouth once a week. 08/05/14  Yes [provider]     Family History  Problem Relation Age of Onset  . Heart disease Father        Dropsy  . Pneumonia Mother   . Sudden death Daughter 64  . Heart attack Son 37    Social History   Social History  . Marital status: Married    Spouse name: N/A  . Number of children: 4  . Years of education: N/A   Social History Main Topics  . Smoking status: Never Smoker  . Smokeless tobacco: Never Used  . Alcohol use No  . Drug use: Unknown  . Sexual activity: Not Asked   Other Topics Concern  . None   Social History Narrative   Lives alone.        Review of Systems patient currently denies fever, headache, chest pain, dyspnea, cough, significant abdominal pain, nausea, vomiting or abnormal bleeding.  Vital Signs: BP (!) 111/59 (BP Location: Left Arm)   Pulse 73   Temp 98 F (36.7 C) (Oral)   Resp 16   Ht _0  (1.651  m)   Wt 118 lb (53.5 kg)   SpO2 95%   BMI 19.64 kg/m   Physical Exam awake alert, in no acute distress. Chest with slightly diminished breath sounds on left, right clear.. Left chest wall pacer in place; heart with normal rate, irregular rhythm. Abdomen soft, positive bowel sounds, nontender. Lower extremities with no significant edema.  Mallampati Score:     Imaging: Ct Abdomen Pelvis W Contrast  Result Date: 05/31/2016 CLINICAL DATA:  Acute abdominal pain, vomiting, diarrhea, elevated LFTs, prior cholecystectomy EXAM: CT ABDOMEN AND PELVIS WITH CONTRAST TECHNIQUE: Multidetector CT imaging of the abdomen and pelvis was performed using the standard protocol following bolus administration of intravenous contrast. CONTRAST:  80 cc Isovue-300 COMPARISON:  None available FINDINGS: Lower chest: Bibasilar scarring/ atelectasis. Cardiomegaly evident. No pericardial or pleural effusion. Pacer wires noted in the right heart. Extensive atherosclerosis of the descending thoracic aorta. Hepatobiliary: Remote cholecystectomy. Intrahepatic biliary dilatation in the left hepatic lobe extends to  the biliary confluence where there is rounded soft tissue/nodular attenuation, images 17 through 19. Differential considerations include upper biliary tree choledocholithiasis resulting in the ductal obstruction. Difficult to exclude biliary stricture or neoplastic process such as cholangiocarcinoma at this level. Minor right biliary prominence. Common bile duct is dilated but extends all the way to the ampulla and may be chronically dilated related to the cholecystectomy. Pancreas is diffusely thin atrophic without appreciable mass or focal abnormality. Pancreas: Diffuse atrophy. No ductal dilatation. No surrounding inflammatory process or fluid collection. Spleen: Normal in size without focal abnormality. Adrenals/Urinary Tract: Normal adrenal glands for age. Kidneys demonstrate normal symmetric enhancement. No obstructing  ureteral calculus or hydroureter. Urinary bladder grossly unremarkable. Nonobstructing punctate left nephrolithiasis measures 5 mm, image 25. Stomach/Bowel: Negative for bowel obstruction, significant dilatation, ileus, or free air. Colonic diverticulosis noted. Large volume of stool in the rectum, suggest fecal impaction. No fluid collection or abscess. Vascular/Lymphatic: Extensive atherosclerosis of the aorta and abdominal vasculature. Negative for aneurysm. No occlusive process. Iliac vessels are heavily calcified and tortuous but remain patent. No adenopathy. Reproductive: Remote hysterectomy. No adnexal mass. No pelvic free fluid or fluid collection. Other: No inguinal abnormality.  Intact abdominal wall. Musculoskeletal: Bones are osteopenic. Advanced degenerative spondylosis of the spine. Chronic changes of the lumbar spine with deformity at at L4-5. No acute osseous finding. IMPRESSION: Diffuse extensive left intrahepatic biliary dilatation extends to the biliary confluence. Central left hepatic duct nodular ill-defined soft tissue attenuation may represent upper biliary tree choledocholithiasis versus underlying intra biliary abnormality or mass such as cholangiocarcinoma. Mild biliary prominence of the right hepatic ducts and mild dilatation of the common bile duct may be secondary to a remote cholecystectomy. Consider further evaluation with GI consultation. Extensive abdominal and pelvic atherosclerosis Nonobstructing left nephrolithiasis Colonic diverticulosis Electronically Signed   By: Jerilynn Mages.  Shick M.D.   On: 05/31/2016 17:53    Labs:  CBC:  Recent Labs  05/31/16 1308 06/01/16 0530 06/02/16 0352 06/03/16 0918  WBC 9.0 13.0* 28.5* 34.3*  HGB 9.8* 8.6* 9.1* 9.8*  HCT 29.2* 24.9* 26.9* 28.2*  PLT 152 136* 94* 123*    COAGS:  Recent Labs  05/31/16 2150 06/01/16 0530 06/01/16 1735 06/02/16 0352  APTT >200* >200* 136* 77*    BMP:  Recent Labs  05/31/16 1308 06/01/16 0530  06/02/16 0352 06/03/16 0346  NA 139 137 136 136  K 3.3* 3.7 3.9 3.6  CL 106 110 110 111  CO2 25 22 18* 19*  GLUCOSE 99 86 87 98  BUN 22* 18 25* 33*  CALCIUM 8.9 7.8* 7.3* 7.2*  CREATININE 0.96 0.82 1.41* 1.35*  GFRNONAA 48* 59* 30* 32*  GFRAA 56* >60 35* 37*    LIVER FUNCTION TESTS:  Recent Labs  05/31/16 1308 06/01/16 0530 06/02/16 0352 06/03/16 0346  BILITOT 2.4* 1.3* 2.6* 1.3*  AST 200* 120* 142* 135*  ALT 580* 385* 298* 245*  ALKPHOS 558* 454* 450* 433*  PROT 6.3* 5.5* 5.0* 4.8*  ALBUMIN 3.2* 2.7* 2.3* 2.1*    TUMOR MARKERS: No results for input(s): AFPTM, CEA, CA199, CHROMGRNA in the last 8760 hours.  Assessment and Plan: 83 y.o. 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 She was recently admitted with abdominal pain and vomiting as well as diarrhea/weight loss. Lab work showed abnormal LFTs. CT abdomen /pelvis revealed:  Diffuse extensive left intrahepatic biliary dilatation extends to the biliary confluence. Central left hepatic duct nodular ill-defined soft tissue attenuation may represent upper biliary tree choledocholithiasis versus underlying intra biliary abnormality or mass such as cholangiocarcinoma. Mild biliary prominence of the right hepatic ducts and mild dilatation of the common bile duct may be secondary to a remote cholecystectomy. Extensive abdominal and pelvic atherosclerosis Nonobstructing left nephrolithiasis Colonic diverticulosis  GI service has consulted on the patient and  request now received for PTC with biliary drainage. Imaging studies have been reviewed by Dr. Laurence Ferrari. Current labs include WBC 34.3, hemoglobin 9.8, platelets 123k, total bilirubin 1.3, creatinine 1.35. She is afebrile. Details/risks of procedure, including but not limited to, internal bleeding, infection, contrast nephropathy, injury to adjacent structures, need for prolonged drain management discussed with patient and family with their understanding and consent. Patient will need to remain off eliquis for 48 hours prior to the procedure- last dose 5/9 at 1250.    Thank you for this interesting consult.  I greatly enjoyed meeting Avia Merkley Rothlisberger and look forward to participating in their care.  A copy of this report was sent to the requesting provider on this date.  Electronically Signed: D. Nash Mantis 06/03/2016, 3:59 PM   Approximately 40 minutes were spent in face to face in clinical consultation, greater than 50% of which was counseling/coordinating care for percutaneous transhepatic cholangiogram with biliary drain placement

## 2016-06-03 NOTE — Progress Notes (Signed)
PROGRESS NOTE    [name] [name]  OBS:[id number] DOB: 07-Aug-1917 DOA: 05/31/2016 PCP: Crist Infante, MD    Brief Narrative:  83 yo female presents with the chief complain of nausea and vomiting. Patient know to have atrial fibrillation and remote history of gastric cancer sp resection (Bilroth II). Symptoms present for 4 days before admission. Had cholecystectomy in the past. On the initial examination, patient was hemodynamically stable, in pian with a nontender abdomen. AST -ALT were elevated at 580-200 with Alkaline phos 558 and bilirubins at 2.4. CT with positive intrahepatic biliary duct dilatation. Admitted with sepsis from biliary origin. Blood culture positive for E. Coli. Patient not candidate for ERCP, consulted IR for possible percutaneous drain.   Assessment & Plan:   Principal Problem:   Biliary obstruction Active Problems:   Atrial fibrillation ([facility])   Essential hypertension   Nausea & vomiting   Normocytic anemia   1. Biliary obstruction with gram negative bacteremia (E coli) and sepsis (present on admission). Unable to get MRCP due to pacemaker, not candidate for ERCP per gastroenterology, patient tolerating po well. Positive blood culture to e coli, will continue antibiotic therapy with ceftriaxone, and will repeat cultures. Hold on metronidazole. Will follow GI recommendations, patient may be candidate for percutaneous drain. Patient has remained afebrile but worsening leukocytosis.  2. Atrial fibrillation . Rate control with diltiazem and metoprolol. Continue to hold anticoagulation in case of interventional procedure. EKG personally reviewed from 4/10, atrial paced rhythm.   3. AKI. Renal function with cr at 1,35 from 1,41 with K at 3,6 and serum Na at 136, will continue IV fluids with isotonic saline, will decrease rate to 75 ml per hour to prevent volume overload.   4. HTN. Blood pressure systolic 11 to 476, will continue diltiazem.   5.  Anemia. Hb at 9,8 with hct  at 28,2. Continue close monitoring.  Follow as outpatient.   DVT prophylaxis: heparin  Code Status: full  Family Communication:  Disposition Plan: home   Consultants:   Gastroenterology     Procedures:     Antimicrobials:  Ceftriaxone #1     Subjective: Patient with no chest pain, dyspnea or abdominal pain. Able to tolerate po well. Has been ambulating with physical therapy. Patient very weak and deconditioned.   Objective: Vitals:   06/02/16 1445 06/02/16 2115 06/03/16 0513 06/03/16 0941  BP: (!) 116/58 (!) 123/56 118/78 129/68  Pulse: 64 76 (!) 118 (!) 115  Resp: 14 16 18    Temp: 98.2 F (36.8 C) 98.8 F (37.1 C) 98.4 F (36.9 C)   TempSrc: Oral Oral Oral   SpO2: 98% 98% 99%   Weight:      Height:        Intake/Output Summary (Last 24 hours) at 06/03/16 1315 Last data filed at 06/03/16 1237  Gross per 24 hour  Intake             3870 ml  Output                0 ml  Net             3870 ml   Filed Weights   05/31/16 1306  Weight: 53.5 kg (118 lb)    Examination:  General exam: deconditioned E ENT: mild pallor, no icterus, oral mucosa moist Respiratory system: Clear to auscultation. Decreased inspiratory effort, no wheezing, rales or rhonchi.  Cardiovascular system: S1 & S2 heard, RRR. No JVD, murmurs, rubs, gallops or clicks. No pedal  edema. Gastrointestinal system: Abdomen is nondistended, soft and nontender. No organomegaly or masses felt. Normal bowel sounds heard. Central nervous system: Alert and oriented. No focal neurological deficits. Extremities: Symmetric 5 x 5 power. Skin: No rashes, lesions or ulcers    Data Reviewed: I have personally reviewed following labs and imaging studies  CBC:  Recent Labs Lab 05/31/16 1308 06/01/16 0530 06/02/16 0352 06/03/16 0918  WBC 9.0 13.0* 28.5* 34.3*  NEUTROABS  --  10.6*  --   --   HGB 9.8* 8.6* 9.1* 9.8*  HCT 29.2* 24.9* 26.9* 28.2*  MCV 98.6 98.0 98.5 95.3  PLT 152 136* 94* 123*    Basic Metabolic Panel:  Recent Labs Lab 05/31/16 1308 06/01/16 0530 06/02/16 0352 06/03/16 0346  NA 139 137 136 136  K 3.3* 3.7 3.9 3.6  CL 106 110 110 111  CO2 25 22 18* 19*  GLUCOSE 99 86 87 98  BUN 22* 18 25* 33*  CREATININE 0.96 0.82 1.41* 1.35*  CALCIUM 8.9 7.8* 7.3* 7.2*   GFR: Estimated Creatinine Clearance: 20.6 mL/min (A) (by C-G formula based on SCr of 1.35 mg/dL (H)). Liver Function Tests:  Recent Labs Lab 05/31/16 1308 06/01/16 0530 06/02/16 0352 06/03/16 0346  AST 200* 120* 142* 135*  ALT 580* 385* 298* 245*  ALKPHOS 558* 454* 450* 433*  BILITOT 2.4* 1.3* 2.6* 1.3*  PROT 6.3* 5.5* 5.0* 4.8*  ALBUMIN 3.2* 2.7* 2.3* 2.1*    Recent Labs Lab 05/31/16 1523  LIPASE <10*   No results for input(s): AMMONIA in the last 168 hours. Coagulation Profile: No results for input(s): INR, PROTIME in the last 168 hours. Cardiac Enzymes: No results for input(s): CKTOTAL, CKMB, CKMBINDEX, TROPONINI in the last 168 hours. BNP (last 3 results) No results for input(s): PROBNP in the last 8760 hours. HbA1C: No results for input(s): HGBA1C in the last 72 hours. CBG: No results for input(s): GLUCAP in the last 168 hours. Lipid Profile: No results for input(s): CHOL, HDL, LDLCALC, TRIG, CHOLHDL, LDLDIRECT in the last 72 hours. Thyroid Function Tests: No results for input(s): TSH, T4TOTAL, FREET4, T3FREE, THYROIDAB in the last 72 hours. Anemia Panel: No results for input(s): VITAMINB12, FOLATE, FERRITIN, TIBC, IRON, RETICCTPCT in the last 72 hours. Sepsis Labs: No results for input(s): PROCALCITON, LATICACIDVEN in the last 168 hours.  Recent Results (from the past 240 hour(s))  Culture, blood (routine x 2)     Status: Abnormal (Preliminary result)   Collection Time: 06/01/16  5:30 PM  Result Value Ref Range Status   Specimen Description BLOOD LEFT HAND  Final   Special Requests IN PEDIATRIC BOTTLE Blood Culture adequate volume  Final   Culture  Setup Time   Final     GRAM NEGATIVE RODS IN PEDIATRIC BOTTLE CRITICAL VALUE NOTED.  VALUE IS CONSISTENT WITH PREVIOUSLY REPORTED AND CALLED VALUE.    Culture (A)  Final    ESCHERICHIA COLI SUSCEPTIBILITIES TO FOLLOW Performed at [facility], [place] [address], [place], [place] [address]    Report Status PENDING  Incomplete  Culture, blood (routine x 2)     Status: Abnormal (Preliminary result)   Collection Time: 06/01/16  5:39 PM  Result Value Ref Range Status   Specimen Description BLOOD RIGHT HAND  Final   Special Requests IN PEDIATRIC BOTTLE Blood Culture adequate volume  Final   Culture  Setup Time   Final    GRAM NEGATIVE RODS AEROBIC BOTTLE ONLY CRITICAL RESULT CALLED TO, READ BACK BY AND VERIFIED  WITH: M. Irven Easterly Pharm.D. 17:50 06/02/16 ([email or web address])    Culture (A)  Final    ESCHERICHIA COLI SUSCEPTIBILITIES TO FOLLOW Performed at [facility], [place] [address] [place], [place] [address]    Report Status PENDING  Incomplete  Blood Culture ID Panel (Reflexed)     Status: Abnormal   Collection Time: 06/01/16  5:39 PM  Result Value Ref Range Status   Enterococcus species NOT DETECTED NOT DETECTED Final   Listeria monocytogenes NOT DETECTED NOT DETECTED Final   Staphylococcus species NOT DETECTED NOT DETECTED Final   Staphylococcus aureus NOT DETECTED NOT DETECTED Final   Streptococcus species NOT DETECTED NOT DETECTED Final   Streptococcus agalactiae NOT DETECTED NOT DETECTED Final   Streptococcus pneumoniae NOT DETECTED NOT DETECTED Final   Streptococcus pyogenes NOT DETECTED NOT DETECTED Final   Acinetobacter baumannii NOT DETECTED NOT DETECTED Final   Enterobacteriaceae species DETECTED (A) NOT DETECTED Final    Comment: Enterobacteriaceae represent a large family of gram-negative bacteria, not a single organism. CRITICAL RESULT CALLED TO, READ BACK BY AND VERIFIED WITH: M. Lilliston Pharm.D. 17:50 06/02/17 ([email or web address])    Enterobacter cloacae complex NOT DETECTED NOT DETECTED Final    Escherichia coli DETECTED (A) NOT DETECTED Final    Comment: CRITICAL RESULT CALLED TO, READ BACK BY AND VERIFIED WITH: M. Lilliston Pharm.D. 17:50 06/02/16 ([email or web address])    Klebsiella oxytoca NOT DETECTED NOT DETECTED Final   Klebsiella pneumoniae NOT DETECTED NOT DETECTED Final   Proteus species NOT DETECTED NOT DETECTED Final   Serratia marcescens NOT DETECTED NOT DETECTED Final   Carbapenem resistance NOT DETECTED NOT DETECTED Final   Haemophilus influenzae NOT DETECTED NOT DETECTED Final   Neisseria meningitidis NOT DETECTED NOT DETECTED Final   Pseudomonas aeruginosa NOT DETECTED NOT DETECTED Final   Candida albicans NOT DETECTED NOT DETECTED Final   Candida glabrata NOT DETECTED NOT DETECTED Final   Candida krusei NOT DETECTED NOT DETECTED Final   Candida parapsilosis NOT DETECTED NOT DETECTED Final   Candida tropicalis NOT DETECTED NOT DETECTED Final    Comment: Performed at [facility], [place] [address], [place], [place] [address]         Radiology Studies: No results found.      Scheduled Meds: . calcium-vitamin D  1 tablet Oral Q breakfast  . [START ON 06/05/2016] Cyanocobalamin  500 mcg Each Nare Once per day on Sat  . diltiazem  120 mg Oral Daily  . fluticasone  2 spray Each Nare Daily  . LORazepam  1 mg Oral QHS  . metoprolol  50 mg Oral BID  . multivitamin with minerals  1 tablet Oral Daily  . omega-3 acid ethyl esters  1 g Oral Daily   Continuous Infusions: . sodium chloride 100 mL/hr at 06/03/16 0200  . cefTRIAXone (ROCEPHIN)  IV Stopped (06/03/16 0533)  . metronidazole Stopped (06/03/16 1206)     LOS: 3 days       Shahir Karen Gerome Apley, MD [facility] Pager [phone]  If 7PM-7AM, please contact night-coverage [email or web address] Password [email or web address] 06/03/2016, 1:15 PM

## 2016-06-03 NOTE — Progress Notes (Signed)
[facility] Progress Note  Chief Complaint:   Biliary obstruction  Subjective: Feels fine today. No abdominal pain, eating well. BM yesterday  Objective:  Vital signs in last 24 hours: Temp:  [98.2 F (36.8 C)-98.8 F (37.1 C)] 98.4 F (36.9 C) (05/10 0513) Pulse Rate:  [64-118] 118 (05/10 0513) Resp:  [14-18] 18 (05/10 0513) BP: (116-123)/(56-78) 118/78 (05/10 0513) SpO2:  [98 %-99 %] 99 % (05/10 0513) Last BM Date: 06/01/16 General:   Alert, well-developed, white female in NAD EENT:  Normal hearing, non icteric sclera, conjunctive pink.  Heart:  Regular rate, irreg rhythm; trace LLE edema.  Pulm: Normal respiratory effort, lungs CTA bilaterally without wheezes or crackles. Abdomen:  Soft, nondistended, nontender.  Normal bowel sounds, no masses felt. Neurologic:  Alert and  oriented x4;  grossly normal neurologically. Psych:  Alert and cooperative. Normal mood and affect.   Intake/Output from previous day: 05/09 0701 - 05/10 0700 In: 3390 [P.O.:740; I.V.:2300; IV Piggyback:350] Out: -  Intake/Output this shift: No intake/output data recorded.  Lab Results:  Recent Labs  05/31/16 1308 06/01/16 0530 06/02/16 0352  WBC 9.0 13.0* 28.5*  HGB 9.8* 8.6* 9.1*  HCT 29.2* 24.9* 26.9*  PLT 152 136* 94*   BMET  Recent Labs  06/01/16 0530 06/02/16 0352 06/03/16 0346  NA 137 136 136  K 3.7 3.9 3.6  CL 110 110 111  CO2 22 18* 19*  GLUCOSE 86 87 98  BUN 18 25* 33*  CREATININE 0.82 1.41* 1.35*  CALCIUM 7.8* 7.3* 7.2*   LFT  Recent Labs  06/03/16 0346  PROT 4.8*  ALBUMIN 2.1*  AST 135*  ALT 245*  ALKPHOS 433*  BILITOT 1.3*  BILIDIR 0.7*  IBILI 0.6   PT/INR No results for input(s): LABPROT, INR in the last 72 hours. Hepatitis Panel  Recent Labs  05/31/16 1523  HEPBSAG Negative  HCVAB <0.1  HEPAIGM Negative  HEPBIGM Negative     Assessment / Plan:  87. 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 Did fine for years. Now CTscan reveals iffuse dilation of LHD. Suspect recurrent stones above bile duct stricture though neoplasm still on list of DDx. Limited options to diagnose and treat. No MRCP because of pacemaker. If stones present then would need tertiary care for treatment. Patient and family want conservative care for now. -LFTs improving.  -Still considered percutaneous biliary drain depending on her clinical course.  -Bacteremic. On antibiotics.   2. Ecoli / Enterobacteriaceae bacteremia. Primary team has changed cipro to Rocephin. Sensitivities pending.   Principal Problem:   Biliary obstruction Active Problems:   Atrial fibrillation (HCC)   Essential hypertension   Nausea & vomiting   Normocytic anemia   LOS: 3 days   Tye Savoy NP 06/03/2016, 9:04 AM  Pager number [phone]  GI ATTENDING  Interval history data reviewed. Patient personally seen and examined. Daughter in room. Agree with interval progress note. She is doing much better today. Alert and without complaints. Eating. Antibiotics have been adjusted based on polymicrobial culture results. Liver tests about the same. CBC pending. She has clearly had biliary sepsis in the intrahepatic area above the high-grade left hepatic duct stricture. We will ask interventional radiology to see the patient regarding percutaneous drainage and possible management of the bile duct stricture. Discussed with patient and daughter. Also spoke with her primary care provider, Dr. Joylene Draft regarding her management. Will  continue to follow.  Docia Chuck. Geri Seminole., M.D. [facility] Division of Gastroenterology

## 2016-06-03 NOTE — Progress Notes (Signed)
Physical Therapy Treatment Patient Details Name: [name] [name] MRN: [id number] DOB: August 07, 1917 Today's Date: 06/03/2016    History of Present Illness  [name] [name] is a 83 y.o. female with medical history significant for cancer of the stomach status post remote resection, tachybradycardia syndrome status post pacer placement, hypertension, and chronic atrial fibrillation on Eliquis who presents to the emergency department for evaluation of nausea and vomiting-->hepatic biliary obstruction.     PT Comments    Assisted OOB to amb in hallway then assisted to bathroom.  Pt required increased time and 25% direction with walker around furniture and in tight spaces.    Follow Up Recommendations  Home health PT;No PT follow up     Equipment Recommendations  None recommended by PT    Recommendations for Other Services       Precautions / Restrictions Precautions Precautions: Fall Restrictions Weight Bearing Restrictions: No    Mobility  Bed Mobility Overal bed mobility: Needs Assistance Bed Mobility: Supine to Sit;Sit to Supine     Supine to sit: Min assist Sit to supine: Min assist   General bed mobility comments: assisted OOB and back to bed  Transfers Overall transfer level: Needs assistance Equipment used: Rolling walker (2 wheeled) Transfers: Sit to/from Stand Sit to Stand: Min guard         General transfer comment: 25% VC's on proper hand placement and 50% VC's turn completion  Ambulation/Gait Ambulation/Gait assistance: Min assist Ambulation Distance (Feet): 38 Feet Assistive device: Rolling walker (2 wheeled) Gait Pattern/deviations: Step-to pattern;Step-through pattern;Decreased stride length;Trunk flexed Gait velocity: decreased   General Gait Details: cues for staying inside RW, incr trunk extension as pt tends to push walker too far to front   Stairs            Wheelchair Mobility    Modified Rankin (Stroke Patients Only)       Balance                                             Cognition Arousal/Alertness: Awake/alert Behavior During Therapy: WFL for tasks assessed/performed Overall Cognitive Status: Within Functional Limits for tasks assessed                                        Exercises      General Comments        Pertinent Vitals/Pain Pain Assessment: No/denies pain    Home Living                      Prior Function            PT Goals (current goals can now be found in the care plan section) Progress towards PT goals: Progressing toward goals    Frequency    Min 3X/week      PT Plan Current plan remains appropriate    Co-evaluation              AM-PAC PT "6 Clicks" Daily Activity  Outcome Measure  Difficulty turning over in bed (including adjusting bedclothes, sheets and blankets)?: A Little Difficulty moving from lying on back to sitting on the side of the bed? : A Little Difficulty sitting down on and standing up from a chair with arms (e.g., wheelchair, bedside  commode, etc,.)?: A Little Help needed moving to and from a bed to chair (including a wheelchair)?: A Little Help needed walking in hospital room?: A Little Help needed climbing 3-5 steps with a railing? : A Little 6 Click Score: 18    End of Session Equipment Utilized During Treatment: Gait belt Activity Tolerance: Patient tolerated treatment well Patient left: with call bell/phone within reach;in chair;with family/visitor present Nurse Communication: Mobility status PT Visit Diagnosis: Unsteadiness on feet (R26.81)     Time: 8101-7510 PT Time Calculation (min) (ACUTE ONLY): 24 min  Charges:  $Gait Training: 8-22 mins $Therapeutic Activity: 8-22 mins                    G Codes:       Rica Koyanagi  PTA [facility]  Acute  Rehab Pager      [phone]

## 2016-06-04 ENCOUNTER — Inpatient Hospital Stay (HOSPITAL_COMMUNITY): Payer: Medicare Other

## 2016-06-04 ENCOUNTER — Encounter (HOSPITAL_COMMUNITY): Payer: Self-pay | Admitting: Interventional Radiology

## 2016-06-04 DIAGNOSIS — Z8719 Personal history of other diseases of the digestive system: Secondary | ICD-10-CM

## 2016-06-04 DIAGNOSIS — R7881 Bacteremia: Secondary | ICD-10-CM

## 2016-06-04 DIAGNOSIS — I495 Sick sinus syndrome: Secondary | ICD-10-CM

## 2016-06-04 DIAGNOSIS — Z8249 Family history of ischemic heart disease and other diseases of the circulatory system: Secondary | ICD-10-CM

## 2016-06-04 DIAGNOSIS — K830 Cholangitis: Secondary | ICD-10-CM

## 2016-06-04 DIAGNOSIS — Z836 Family history of other diseases of the respiratory system: Secondary | ICD-10-CM

## 2016-06-04 DIAGNOSIS — I4891 Unspecified atrial fibrillation: Secondary | ICD-10-CM

## 2016-06-04 DIAGNOSIS — Z9689 Presence of other specified functional implants: Secondary | ICD-10-CM

## 2016-06-04 DIAGNOSIS — K8309 Other cholangitis: Secondary | ICD-10-CM | POA: Diagnosis present

## 2016-06-04 DIAGNOSIS — Z950 Presence of cardiac pacemaker: Secondary | ICD-10-CM

## 2016-06-04 DIAGNOSIS — Z9049 Acquired absence of other specified parts of digestive tract: Secondary | ICD-10-CM

## 2016-06-04 DIAGNOSIS — Z8241 Family history of sudden cardiac death: Secondary | ICD-10-CM

## 2016-06-04 DIAGNOSIS — Z8679 Personal history of other diseases of the circulatory system: Secondary | ICD-10-CM

## 2016-06-04 DIAGNOSIS — K5641 Fecal impaction: Secondary | ICD-10-CM | POA: Diagnosis present

## 2016-06-04 HISTORY — PX: IR INT EXT BILIARY DRAIN WITH CHOLANGIOGRAM: IMG6044

## 2016-06-04 LAB — CULTURE, BLOOD (ROUTINE X 2)
SPECIAL REQUESTS: ADEQUATE
Special Requests: ADEQUATE

## 2016-06-04 LAB — CBC WITH DIFFERENTIAL/PLATELET
BASOS ABS: 0 10*3/uL (ref 0.0–0.1)
BASOS PCT: 0 %
EOS PCT: 0 %
Eosinophils Absolute: 0 10*3/uL (ref 0.0–0.7)
HCT: 39.8 % (ref 36.0–46.0)
Hemoglobin: 13 g/dL (ref 12.0–15.0)
Lymphocytes Relative: 4 %
Lymphs Abs: 1 10*3/uL (ref 0.7–4.0)
MCH: 31.1 pg (ref 26.0–34.0)
MCHC: 32.7 g/dL (ref 30.0–36.0)
MCV: 95.2 fL (ref 78.0–100.0)
MONO ABS: 0.6 10*3/uL (ref 0.1–1.0)
Monocytes Relative: 3 %
Neutro Abs: 21.8 10*3/uL — ABNORMAL HIGH (ref 1.7–7.7)
Neutrophils Relative %: 93 %
PLATELETS: 96 10*3/uL — AB (ref 150–400)
RBC: 4.18 MIL/uL (ref 3.87–5.11)
RDW: 13.9 % (ref 11.5–15.5)
WBC: 23.4 10*3/uL — ABNORMAL HIGH (ref 4.0–10.5)

## 2016-06-04 LAB — BASIC METABOLIC PANEL
Anion gap: 9 (ref 5–15)
BUN: 27 mg/dL — AB (ref 6–20)
CHLORIDE: 110 mmol/L (ref 101–111)
CO2: 16 mmol/L — AB (ref 22–32)
Calcium: 7.4 mg/dL — ABNORMAL LOW (ref 8.9–10.3)
Creatinine, Ser: 1.07 mg/dL — ABNORMAL HIGH (ref 0.44–1.00)
GFR calc Af Amer: 49 mL/min — ABNORMAL LOW (ref >60)
GFR, EST NON AFRICAN AMERICAN: 42 mL/min — AB (ref >60)
GLUCOSE: 105 mg/dL — AB (ref 65–99)
POTASSIUM: 3.7 mmol/L (ref 3.5–5.1)
Sodium: 135 mmol/L (ref 135–145)

## 2016-06-04 LAB — URINALYSIS, ROUTINE W REFLEX MICROSCOPIC
BILIRUBIN URINE: NEGATIVE
Bacteria, UA: NONE SEEN
Glucose, UA: NEGATIVE mg/dL
HGB URINE DIPSTICK: NEGATIVE
Ketones, ur: NEGATIVE mg/dL
Nitrite: NEGATIVE
PROTEIN: NEGATIVE mg/dL
Specific Gravity, Urine: 1.019 (ref 1.005–1.030)
pH: 5 (ref 5.0–8.0)

## 2016-06-04 LAB — HEPATIC FUNCTION PANEL
ALK PHOS: 471 U/L — AB (ref 38–126)
ALT: 204 U/L — ABNORMAL HIGH (ref 14–54)
AST: 78 U/L — ABNORMAL HIGH (ref 15–41)
Albumin: 2.3 g/dL — ABNORMAL LOW (ref 3.5–5.0)
BILIRUBIN DIRECT: 0.5 mg/dL (ref 0.1–0.5)
BILIRUBIN INDIRECT: 0.6 mg/dL (ref 0.3–0.9)
BILIRUBIN TOTAL: 1.1 mg/dL (ref 0.3–1.2)
Total Protein: 5.2 g/dL — ABNORMAL LOW (ref 6.5–8.1)

## 2016-06-04 LAB — PROTIME-INR
INR: 1.12
Prothrombin Time: 14.5 seconds (ref 11.4–15.2)

## 2016-06-04 LAB — NA AND K (SODIUM & POTASSIUM), RAND UR
Potassium Urine: 35 mmol/L
Sodium, Ur: 49 mmol/L

## 2016-06-04 LAB — OSMOLALITY, URINE: Osmolality, Ur: 601 mOsm/kg (ref 300–900)

## 2016-06-04 MED ORDER — SENNOSIDES-DOCUSATE SODIUM 8.6-50 MG PO TABS
1.0000 | ORAL_TABLET | Freq: Two times a day (BID) | ORAL | Status: DC
  Administered 2016-06-04 – 2016-06-07 (×4): 1 via ORAL
  Filled 2016-06-04 (×4): qty 1

## 2016-06-04 MED ORDER — IOPAMIDOL (ISOVUE-300) INJECTION 61%
INTRAVENOUS | Status: AC
  Administered 2016-06-04: 10 mL
  Filled 2016-06-04: qty 50

## 2016-06-04 MED ORDER — LIDOCAINE HCL 1 % IJ SOLN
INTRAMUSCULAR | Status: AC | PRN
  Administered 2016-06-04: 10 mL

## 2016-06-04 MED ORDER — FENTANYL CITRATE (PF) 100 MCG/2ML IJ SOLN
INTRAMUSCULAR | Status: AC
  Filled 2016-06-04: qty 4

## 2016-06-04 MED ORDER — SODIUM CHLORIDE 0.9 % IV SOLN
1.5000 g | Freq: Three times a day (TID) | INTRAVENOUS | Status: DC
  Administered 2016-06-04 – 2016-06-07 (×8): 1.5 g via INTRAVENOUS
  Filled 2016-06-04 (×9): qty 1.5

## 2016-06-04 MED ORDER — FLUMAZENIL 0.5 MG/5ML IV SOLN
INTRAVENOUS | Status: AC
  Filled 2016-06-04: qty 5

## 2016-06-04 MED ORDER — NALOXONE HCL 0.4 MG/ML IJ SOLN
INTRAMUSCULAR | Status: AC
  Filled 2016-06-04: qty 1

## 2016-06-04 MED ORDER — MIDAZOLAM HCL 2 MG/2ML IJ SOLN
INTRAMUSCULAR | Status: AC
  Filled 2016-06-04: qty 4

## 2016-06-04 MED ORDER — FENTANYL CITRATE (PF) 100 MCG/2ML IJ SOLN
INTRAMUSCULAR | Status: AC | PRN
  Administered 2016-06-04 (×2): 25 ug via INTRAVENOUS

## 2016-06-04 MED ORDER — APIXABAN 2.5 MG PO TABS
2.5000 mg | ORAL_TABLET | Freq: Two times a day (BID) | ORAL | Status: DC
  Administered 2016-06-04 – 2016-06-07 (×6): 2.5 mg via ORAL
  Filled 2016-06-04 (×6): qty 1

## 2016-06-04 MED ORDER — POLYETHYLENE GLYCOL 3350 17 G PO PACK
17.0000 g | PACK | Freq: Two times a day (BID) | ORAL | Status: DC
  Administered 2016-06-04 – 2016-06-06 (×4): 17 g via ORAL
  Filled 2016-06-04 (×5): qty 1

## 2016-06-04 MED ORDER — IOPAMIDOL (ISOVUE-300) INJECTION 61%
10.0000 mL | Freq: Once | INTRAVENOUS | Status: AC | PRN
  Administered 2016-06-04: 10 mL

## 2016-06-04 MED ORDER — LIDOCAINE HCL 1 % IJ SOLN
INTRAMUSCULAR | Status: AC
  Filled 2016-06-04: qty 20

## 2016-06-04 MED ORDER — MIDAZOLAM HCL 2 MG/2ML IJ SOLN
INTRAMUSCULAR | Status: AC | PRN
  Administered 2016-06-04 (×2): 0.5 mg via INTRAVENOUS

## 2016-06-04 MED ORDER — LORATADINE 10 MG PO TABS
10.0000 mg | ORAL_TABLET | Freq: Every day | ORAL | Status: DC
  Administered 2016-06-04 – 2016-06-07 (×4): 10 mg via ORAL
  Filled 2016-06-04 (×4): qty 1

## 2016-06-04 MED ORDER — STERILE WATER FOR INJECTION IV SOLN
INTRAVENOUS | Status: DC
  Administered 2016-06-04 – 2016-06-07 (×5): via INTRAVENOUS
  Filled 2016-06-04 (×9): qty 850

## 2016-06-04 NOTE — Progress Notes (Signed)
     [facility] Progress Note  Chief Complaint:  Biliary obstruction    Subjective: Feels fine . No abdominal pain. Bowels moving.   Objective:  Vital signs in last 24 hours: Temp:  [98 F (36.7 C)-98.4 F (36.9 C)] 98.4 F (36.9 C) (05/11 0700) Pulse Rate:  [73-95] 95 (05/11 0700) Resp:  [16-20] 20 (05/11 0700) BP: (111-138)/(59-80) 134/80 (05/11 0700) SpO2:  [95 %-96 %] 96 % (05/11 0700) Last BM Date: 06/03/16 General:   Alert, well-developed, white female in NAD. Daughters in room EENT:  Normal hearing, non icteric sclera, conjunctive pink.  Heart:  Regular rate, irreg rhythm, no lower extremity edema Pulm: Normal respiratory effor Abdomen:  Soft, nondistended, nontender.  Normal bowel sounds, no masses felt.  Neurologic:  Alert and  oriented x4;  grossly normal neurologically. Psych:  Alert and cooperative. Normal mood and affect.   Intake/Output from previous day: 05/10 0701 - 05/11 0700 In: 3366.7 [P.O.:1180; I.V.:1936.7; IV Piggyback:250] Out: 200 [Urine:200] Intake/Output this shift: No intake/output data recorded.  Lab Results:  Recent Labs  06/02/16 0352 06/03/16 0918 06/04/16 0352  WBC 28.5* 34.3* 23.4*  HGB 9.1* 9.8* 13.0  HCT 26.9* 28.2* 39.8  PLT 94* 123* 96*   BMET  Recent Labs  06/02/16 0352 06/03/16 0346 06/04/16 0352  NA 136 136 135  K 3.9 3.6 3.7  CL 110 111 110  CO2 18* 19* 16*  GLUCOSE 87 98 105*  BUN 25* 33* 27*  CREATININE 1.41* 1.35* 1.07*  CALCIUM 7.3* 7.2* 7.4*   LFT  Recent Labs  06/04/16 0352  PROT 5.2*  ALBUMIN 2.3*  AST 78*  ALT 204*  ALKPHOS 471*  BILITOT 1.1  BILIDIR 0.5  IBILI 0.6   PT/INR  Recent Labs  06/04/16 0352  LABPROT 14.5  INR 1.12    Assessment / Plan:  1. 83 yo female with intrahepatic biliary obstruction / Ecoli / Enterobacteriaceae bacteremia. She has a remote hx of left hepatic duct stricture with stones. Did fine for years, now with recurrent intrahepatic biliary  obstruction. Probably recurrent stones though malignancy still on DDx list. Couldn't further evaluate biliary tree by MR due to her pacemaker.  -Plan is for percutaneous biliary drain by IR today. I discussed the reasons for the drain and how it would alleviate the obstruction. Patient and daughters understand, they had already discussed things with Lennette Bihari, IR P.A.  -Her antibiotic was changed to Rocephin yesterday based on sensitivities. . WBC down from 34 yesterday to 23. She is afebrile. Hospitalist consulted ID  3. AFib, Eliquis is on hold.   Principal Problem:   Biliary obstruction Active Problems:   Atrial fibrillation (HCC)   Essential hypertension   Nausea & vomiting   Normocytic anemia   LOS: 4 days   Tye Savoy NP 06/04/2016, 10:32 AM  Pager number [phone]  GI ATTENDING  Interval history data reviewed. Patient seen and examined. Patient doing well. Agree with interval progress note as outlined above. Plans for percutaneous drainage of the obstructed left biliary system. I Discussed with Dr. Annamaria Boots and notified primary, Dr. Joylene Draft.  Docia Chuck. Geri Seminole., M.D. [facility] Division of Gastroenterology

## 2016-06-04 NOTE — Progress Notes (Signed)
PT Cancellation Note  Patient Details Name: [name] [name] MRN: [id number] DOB: 13-Jun-1917   Cancelled Treatment:     Procedure today percutaneous biliary drain by IR will check back later as schedule permits.  Pt has been evaluated by PT and plans D/C back home with family support.    Rica Koyanagi  PTA [facility]  Acute  Rehab Pager      [phone]

## 2016-06-04 NOTE — Consult Note (Signed)
Date of Admission:  05/31/2016  Date of Consult:  06/04/2016  Reason for Consult: E coli bacteremia from biliary source in patient with Turner maker Referring Physician: Dr. Renee Pain  HPI: [name] [name] is an 83 y.o. female with hx of Tachy brady syndrome sp PPM, atrial fibrillation, hx of cholecystectomy admitted with nausea, vomiting found to have diffuse extensive left intrahepatic biliary ductal dilation extending to the biliary confluence with central left hepatic ill-defined nodular soft tissue attenuation, possibly reflecting choledocholithiasis or mass. She was septic and blood cultures have revealed a very sensitive E coli in 2/2 blood cultures. She has had billary drain placed by IR today.     Past Medical History:  Diagnosis Date  . Atrial fibrillation ([facility])    a. on Eliquis  . Hypertension   . Stomach cancer ([facility])    1992  . Tachy-brady syndrome ([facility])    a. s/p STJ dual chamber PPM implant    Past Surgical History:  Procedure Laterality Date  . BACK SURGERY    . CHOLECYSTECTOMY    . IR INT EXT BILIARY DRAIN WITH CHOLANGIOGRAM  06/04/2016  . PARTIAL GASTRECTOMY  1992  . PERMANENT PACEMAKER INSERTION N/A 05/16/2014   STJ Assurity dual chamber pacemaker implanted by Dr Lovena Le  . TOTAL ABDOMINAL HYSTERECTOMY      Social History:  reports that she has never smoked. She has never used smokeless tobacco. She reports that she does not drink alcohol. Her drug history is not on file.   Family History  Problem Relation Age of Onset  . Heart disease Father        Dropsy  . Pneumonia Mother   . Sudden death Daughter 4  . Heart attack Son 43    No Known Allergies   Medications: I have reviewed patients current medications as documented in Epic Anti-infectives    Start     Dose/Rate Route Frequency Ordered Stop   06/03/16 0400  ciprofloxacin (CIPRO) IVPB 400 mg  Status:  Discontinued     400 mg 200 mL/hr over 60 Minutes Intravenous Every 24 hours 06/02/16  0737 06/02/16 1800   06/03/16 0400  cefTRIAXone (ROCEPHIN) 2 g in dextrose 5 % 50 mL IVPB  Status:  Discontinued     2 g 100 mL/hr over 30 Minutes Intravenous Every 24 hours 06/02/16 1800 06/04/16 1818   06/02/16 1100  metroNIDAZOLE (FLAGYL) IVPB 500 mg  Status:  Discontinued     500 mg 100 mL/hr over 60 Minutes Intravenous Every 8 hours 06/02/16 1003 06/04/16 0940   06/01/16 1600  ciprofloxacin (CIPRO) IVPB 400 mg  Status:  Discontinued     400 mg 200 mL/hr over 60 Minutes Intravenous Every 12 hours 06/01/16 1503 06/02/16 0737         ROS:as in HPI otherwise remainder of 12 point Review of Systems is negative  Blood pressure 120/66, pulse 87, temperature 98 F (36.7 C), temperature source Oral, resp. rate 18, height 5' 5"  (1.651 m), weight 118 lb (53.5 kg), SpO2 91 %. General: Alert and awake, oriented person and place, HOH HEENT: anicteric sclera,  EOMI, oropharynx clear and without exudate, edentulous  Cardiovascular: regular rate, normal r,  no murmur rubs or gallops, PM pocket is clean Pulmonary: clear to auscultation bilaterally, no wheezing, rales or rhonchi Gastrointestinal: diffusely tender greatest in RUQ, +s   Musculoskeletal: no  clubbing or edema noted bilaterally Skin, soft tissue: no rashes Neuro: nonfocal, strength and sensation intact  Results for orders placed or performed during the hospital encounter of 05/31/16 (from the past 48 hour(s))  Basic metabolic panel     Status: Abnormal   Collection Time: 06/03/16  3:46 AM  Result Value Ref Range   Sodium 136 135 - 145 mmol/L   Potassium 3.6 3.5 - 5.1 mmol/L   Chloride 111 101 - 111 mmol/L   CO2 19 (L) 22 - 32 mmol/L   Glucose, Bld 98 65 - 99 mg/dL   BUN 33 (H) 6 - 20 mg/dL   Creatinine, Ser 1.35 (H) 0.44 - 1.00 mg/dL   Calcium 7.2 (L) 8.9 - 10.3 mg/dL   GFR calc non Af Amer 32 (L) >60 mL/min   GFR calc Af Amer 37 (L) >60 mL/min    Comment: (NOTE) The eGFR has been calculated using the CKD EPI  equation. This calculation has not been validated in all clinical situations. eGFR's persistently <60 mL/min signify possible Chronic Kidney Disease.    Anion gap 6 5 - 15  Hepatic function panel     Status: Abnormal   Collection Time: 06/03/16  3:46 AM  Result Value Ref Range   Total Protein 4.8 (L) 6.5 - 8.1 g/dL   Albumin 2.1 (L) 3.5 - 5.0 g/dL   AST 135 (H) 15 - 41 U/L   ALT 245 (H) 14 - 54 U/L   Alkaline Phosphatase 433 (H) 38 - 126 U/L   Total Bilirubin 1.3 (H) 0.3 - 1.2 mg/dL   Bilirubin, Direct 0.7 (H) 0.1 - 0.5 mg/dL   Indirect Bilirubin 0.6 0.3 - 0.9 mg/dL  CBC Once     Status: Abnormal   Collection Time: 06/03/16  9:18 AM  Result Value Ref Range   WBC 34.3 (H) 4.0 - 10.5 K/uL   RBC 2.96 (L) 3.87 - 5.11 MIL/uL   Hemoglobin 9.8 (L) 12.0 - 15.0 g/dL   HCT 28.2 (L) 36.0 - 46.0 %   MCV 95.3 78.0 - 100.0 fL   MCH 33.1 26.0 - 34.0 pg   MCHC 34.8 30.0 - 36.0 g/dL   RDW 13.9 11.5 - 15.5 %   Platelets 123 (L) 150 - 400 K/uL  Culture, blood (routine x 2)     Status: None (Preliminary result)   Collection Time: 06/03/16 10:10 AM  Result Value Ref Range   Specimen Description BLOOD LEFT HAND    Special Requests IN PEDIATRIC BOTTLE Blood Culture adequate volume    Culture      NO GROWTH < 24 HOURS Performed at [facility], [place] [address], [place], [place] [address]    Report Status PENDING   Culture, blood (routine x 2)     Status: None (Preliminary result)   Collection Time: 06/03/16 10:11 AM  Result Value Ref Range   Specimen Description BLOOD RIGHT HAND    Special Requests IN PEDIATRIC BOTTLE Blood Culture adequate volume    Culture      NO GROWTH < 24 HOURS Performed at [facility] [address] [place], [place] [address]    Report Status PENDING   Na and K (sodium & potassium), rand urine     Status: None   Collection Time: 06/04/16  2:54 AM  Result Value Ref Range   Sodium, Ur 49 mmol/L   Potassium Urine 35 mmol/L    Comment: Performed at  [facility] [address], [place], [place] [address]  Osmolality, urine     Status: None  Collection Time: 06/04/16  2:54 AM  Result Value Ref Range   Osmolality, Ur 601 300 - 900 mOsm/kg    Comment: Performed at [facility] [address] [place], [place] [address]  Urinalysis, Routine w reflex microscopic     Status: Abnormal   Collection Time: 06/04/16  2:54 AM  Result Value Ref Range   Color, Urine AMBER (A) YELLOW    Comment: BIOCHEMICALS MAY BE AFFECTED BY COLOR   APPearance HAZY (A) CLEAR   Specific Gravity, Urine 1.019 1.005 - 1.030   pH 5.0 5.0 - 8.0   Glucose, UA NEGATIVE NEGATIVE mg/dL   Hgb urine dipstick NEGATIVE NEGATIVE   Bilirubin Urine NEGATIVE NEGATIVE   Ketones, ur NEGATIVE NEGATIVE mg/dL   Protein, ur NEGATIVE NEGATIVE mg/dL   Nitrite NEGATIVE NEGATIVE   Leukocytes, UA TRACE (A) NEGATIVE   RBC / HPF 0-5 0 - 5 RBC/hpf   WBC, UA 0-5 0 - 5 WBC/hpf   Bacteria, UA NONE SEEN NONE SEEN   Squamous Epithelial / LPF 0-5 (A) NONE SEEN  Basic metabolic panel     Status: Abnormal   Collection Time: 06/04/16  3:52 AM  Result Value Ref Range   Sodium 135 135 - 145 mmol/L   Potassium 3.7 3.5 - 5.1 mmol/L   Chloride 110 101 - 111 mmol/L   CO2 16 (L) 22 - 32 mmol/L   Glucose, Bld 105 (H) 65 - 99 mg/dL   BUN 27 (H) 6 - 20 mg/dL   Creatinine, Ser 1.07 (H) 0.44 - 1.00 mg/dL   Calcium 7.4 (L) 8.9 - 10.3 mg/dL   GFR calc non Af Amer 42 (L) >60 mL/min   GFR calc Af Amer 49 (L) >60 mL/min    Comment: (NOTE) The eGFR has been calculated using the CKD EPI equation. This calculation has not been validated in all clinical situations. eGFR's persistently <60 mL/min signify possible Chronic Kidney Disease.    Anion gap 9 5 - 15  Hepatic function panel     Status: Abnormal   Collection Time: 06/04/16  3:52 AM  Result Value Ref Range   Total Protein 5.2 (L) 6.5 - 8.1 g/dL   Albumin 2.3 (L) 3.5 - 5.0 g/dL   AST 78 (H) 15 - 41 U/L   ALT 204 (H) 14 - 54 U/L    Alkaline Phosphatase 471 (H) 38 - 126 U/L   Total Bilirubin 1.1 0.3 - 1.2 mg/dL   Bilirubin, Direct 0.5 0.1 - 0.5 mg/dL   Indirect Bilirubin 0.6 0.3 - 0.9 mg/dL  CBC with Differential/Platelet     Status: Abnormal   Collection Time: 06/04/16  3:52 AM  Result Value Ref Range   WBC 23.4 (H) 4.0 - 10.5 K/uL   RBC 4.18 3.87 - 5.11 MIL/uL   Hemoglobin 13.0 12.0 - 15.0 g/dL    Comment: DELTA CHECK NOTED REPEATED TO VERIFY    HCT 39.8 36.0 - 46.0 %   MCV 95.2 78.0 - 100.0 fL   MCH 31.1 26.0 - 34.0 pg   MCHC 32.7 30.0 - 36.0 g/dL   RDW 13.9 11.5 - 15.5 %   Platelets 96 (L) 150 - 400 K/uL    Comment: CONSISTENT WITH PREVIOUS RESULT   Neutrophils Relative % 93 %   Neutro Abs 21.8 (H) 1.7 - 7.7 K/uL   Lymphocytes Relative 4 %   Lymphs Abs 1.0 0.7 - 4.0 K/uL   Monocytes Relative 3 %   Monocytes Absolute 0.6 0.1 -  1.0 K/uL   Eosinophils Relative 0 %   Eosinophils Absolute 0.0 0.0 - 0.7 K/uL   Basophils Relative 0 %   Basophils Absolute 0.0 0.0 - 0.1 K/uL  Protime-INR     Status: None   Collection Time: 06/04/16  3:52 AM  Result Value Ref Range   Prothrombin Time 14.5 11.4 - 15.2 seconds   INR 1.12    @BRIEFLABTABLE (sdes,specrequest,cult,reptstatus)   ) Recent Results (from the past 720 hour(s))  Culture, blood (routine x 2)     Status: Abnormal   Collection Time: 06/01/16  5:30 PM  Result Value Ref Range Status   Specimen Description BLOOD LEFT HAND  Final   Special Requests IN PEDIATRIC BOTTLE Blood Culture adequate volume  Final   Culture  Setup Time   Final    GRAM NEGATIVE RODS IN PEDIATRIC BOTTLE CRITICAL VALUE NOTED.  VALUE IS CONSISTENT WITH PREVIOUSLY REPORTED AND CALLED VALUE.    Culture (A)  Final    ESCHERICHIA COLI SUSCEPTIBILITIES PERFORMED ON PREVIOUS CULTURE WITHIN THE LAST 5 DAYS. Performed at [facility], [place] [address], [place], [place] [address]    Report Status 06/04/2016 FINAL  Final  Culture, blood (routine x 2)     Status: Abnormal    Collection Time: 06/01/16  5:39 PM  Result Value Ref Range Status   Specimen Description BLOOD RIGHT HAND  Final   Special Requests IN PEDIATRIC BOTTLE Blood Culture adequate volume  Final   Culture  Setup Time   Final    GRAM NEGATIVE RODS AEROBIC BOTTLE ONLY CRITICAL RESULT CALLED TO, READ BACK BY AND VERIFIED WITH: M. Lilliston Pharm.D. 17:50 06/02/16 ([email or web address]) Performed at [facility], [place] [address], [place], [place] [address]    Culture ESCHERICHIA COLI (A)  Final   Report Status 06/04/2016 FINAL  Final   Organism ID, Bacteria ESCHERICHIA COLI  Final      Susceptibility   Escherichia coli - MIC*    AMPICILLIN 4 SENSITIVE Sensitive     CEFAZOLIN <=4 SENSITIVE Sensitive     CEFEPIME <=1 SENSITIVE Sensitive     CEFTAZIDIME <=1 SENSITIVE Sensitive     CEFTRIAXONE <=1 SENSITIVE Sensitive     CIPROFLOXACIN <=0.25 SENSITIVE Sensitive     GENTAMICIN <=1 SENSITIVE Sensitive     IMIPENEM <=0.25 SENSITIVE Sensitive     TRIMETH/SULFA <=20 SENSITIVE Sensitive     AMPICILLIN/SULBACTAM <=2 SENSITIVE Sensitive     PIP/TAZO <=4 SENSITIVE Sensitive     Extended ESBL NEGATIVE Sensitive     * ESCHERICHIA COLI  Blood Culture ID Panel (Reflexed)     Status: Abnormal   Collection Time: 06/01/16  5:39 PM  Result Value Ref Range Status   Enterococcus species NOT DETECTED NOT DETECTED Final   Listeria monocytogenes NOT DETECTED NOT DETECTED Final   Staphylococcus species NOT DETECTED NOT DETECTED Final   Staphylococcus aureus NOT DETECTED NOT DETECTED Final   Streptococcus species NOT DETECTED NOT DETECTED Final   Streptococcus agalactiae NOT DETECTED NOT DETECTED Final   Streptococcus pneumoniae NOT DETECTED NOT DETECTED Final   Streptococcus pyogenes NOT DETECTED NOT DETECTED Final   Acinetobacter baumannii NOT DETECTED NOT DETECTED Final   Enterobacteriaceae species DETECTED (A) NOT DETECTED Final    Comment: Enterobacteriaceae represent a large family of gram-negative bacteria, not a  single organism. CRITICAL RESULT CALLED TO, READ BACK BY AND VERIFIED WITH: M. Lilliston Pharm.D. 17:50 06/02/17 (wilsonm)    Enterobacter cloacae complex NOT DETECTED NOT DETECTED Final  Escherichia coli DETECTED (A) NOT DETECTED Final    Comment: CRITICAL RESULT CALLED TO, READ BACK BY AND VERIFIED WITH: M. Lilliston Pharm.D. 17:50 06/02/16 (wilsonm)    Klebsiella oxytoca NOT DETECTED NOT DETECTED Final   Klebsiella pneumoniae NOT DETECTED NOT DETECTED Final   Proteus species NOT DETECTED NOT DETECTED Final   Serratia marcescens NOT DETECTED NOT DETECTED Final   Carbapenem resistance NOT DETECTED NOT DETECTED Final   Haemophilus influenzae NOT DETECTED NOT DETECTED Final   Neisseria meningitidis NOT DETECTED NOT DETECTED Final   Pseudomonas aeruginosa NOT DETECTED NOT DETECTED Final   Candida albicans NOT DETECTED NOT DETECTED Final   Candida glabrata NOT DETECTED NOT DETECTED Final   Candida krusei NOT DETECTED NOT DETECTED Final   Candida parapsilosis NOT DETECTED NOT DETECTED Final   Candida tropicalis NOT DETECTED NOT DETECTED Final    Comment: Performed at [facility], [place] [address], [place], [place] [address]  Culture, blood (routine x 2)     Status: None (Preliminary result)   Collection Time: 06/03/16 10:10 AM  Result Value Ref Range Status   Specimen Description BLOOD LEFT HAND  Final   Special Requests IN PEDIATRIC BOTTLE Blood Culture adequate volume  Final   Culture   Final    NO GROWTH < 24 HOURS Performed at [facility], [place] [address], [place], [place] [address]    Report Status PENDING  Incomplete  Culture, blood (routine x 2)     Status: None (Preliminary result)   Collection Time: 06/03/16 10:11 AM  Result Value Ref Range Status   Specimen Description BLOOD RIGHT HAND  Final   Special Requests IN PEDIATRIC BOTTLE Blood Culture adequate volume  Final   Culture   Final    NO GROWTH < 24 HOURS Performed at [facility], [place] [address]  [place], [place] [address]    Report Status PENDING  Incomplete     Impression/Recommendation  Principal Problem:   Biliary sepsis Active Problems:   Atrial fibrillation ([facility])   Tachy-brady syndrome ([facility])   Essential hypertension   Sinus node dysfunction - s/p St. Jude (serial number [id number]) PPM   Nausea & vomiting   Biliary obstruction   Normocytic anemia   E coli bacteremia   Fecal impaction ([facility])   [name] [name] is a 83 y.o. female with  PM for tachy brady syndrome sp cholecystectomy admitted with bilary pathology, obstructive hepatitis and E coli bacteremia (from biliary source)  #1 E coli bacteremia in context of biliary pathology:  I will change to Unasyn for lower risk abx as far as CDI  Agree with repeat blood cultures  TTE  (would not push for TEE)  I would recommend 2 weeks of parenteral therapy given my concern about risk of seeding her PM  Dr. Johnnye Sima is available for questions this weekend and I will be back on Monday.      06/04/2016, 6:25 PM   Thank you so much for this interesting consult  [facility] for [facility] [phone] (pager) [phone] (office) 06/04/2016, 6:25 PM  [place] 06/04/2016, 6:25 PM

## 2016-06-04 NOTE — Progress Notes (Signed)
Pharmacy Antibiotic Note  [name] [name] is a 83 y.o. female admitted on 05/31/2016 with bacteremia.  Pharmacy has been consulted for Unasyn dosing for E coli bacteremia.  WBC 23.4, creat 1.07, wt 53.5,   Plan: Unasyn 1.5 gm IV q8h  Height: 5\' 5"  (165.1 cm) Weight: 118 lb (53.5 kg) IBW/kg (Calculated) : 57  Temp (24hrs), Avg:98.2 F (36.8 C), Min:98 F (36.7 C), Max:98.4 F (36.9 C)   Recent Labs Lab 05/31/16 1308 06/01/16 0530 06/02/16 0352 06/03/16 0346 06/03/16 0918 06/04/16 0352  WBC 9.0 13.0* 28.5*  --  34.3* 23.4*  CREATININE 0.96 0.82 1.41* 1.35*  --  1.07*    Estimated Creatinine Clearance: 26 mL/min (A) (by C-G formula based on SCr of 1.07 mg/dL (H)).    No Known Allergies  Antimicrobials this admission: 5/8 Cipro >> 5/9 5/9 Metronidazole >> 5/11 5/10 Rocephin >>5/11 5/11 Unasyn>>  Microbiology results: 5/8 BCx: BCID 2/2 E coli, pan sens (Amp,Cefaz,Cefep,Ceftaz,CTX, Cipro,Zosyn,Bactrim) 5/7 Hep panel: neg 5/10 rpt BCx: ngtd  Thank you for allowing pharmacy to be a part of this patient's care.  Eudelia Bunch, Pharm.D. [phone] 06/04/2016 6:26 PM

## 2016-06-04 NOTE — Care Management Important Message (Signed)
Important Message  Patient Details  Name: ELDENA DEDE MRN: [id number] Date of Birth: [birth year]   Medicare Important Message Given:  Yes    Kerin Salen 06/04/2016, 11:23 AMImportant Message  Patient Details  Name: TAHIRY SPICER MRN: [id number] Date of Birth: 04-18-1917   Medicare Important Message Given:  Yes    Kerin Salen 06/04/2016, 11:20 AMImportant Message  Patient Details  Name: AKI BURDIN MRN: [id number] Date of Birth: [birth year]   Medicare Important Message Given:  Yes    Kerin Salen 06/04/2016, 11:20 AM

## 2016-06-04 NOTE — Procedures (Signed)
History of left hepatic duct stricture with choledocholithiasis. Recurrent left biliary dilatation.  Status post ultrasound and fluoroscopic left internal/external biliary drain  No immediate complication  EBL 0  Full report in PACs

## 2016-06-04 NOTE — Progress Notes (Signed)
PROGRESS NOTE    [facility]  XVQ:[id number] DOB: 1918-01-04 DOA: 05/31/2016 PCP: Crist Infante, MD    Brief Narrative:  83 yo female presents with the chief complain of nausea and vomiting. Patient know to have atrial fibrillation and remote history of gastric cancer sp resection (Bilroth II). Symptoms present for 4 days before admission. Had cholecystectomy in the past. On the initial examination, patient was hemodynamically stable, in pian with a nontender abdomen. AST -ALT were elevated at 580-200 with Alkaline phos 558 and bilirubins at 2.4. CT with positive intrahepatic biliary duct dilatation. Admitted with sepsis from biliary origin. Blood culture positive for E. Coli. Patient not candidate for ERCP, consulted IR for percutaneous drain placement 06/04/2016   Assessment & Plan:   Principal Problem:   Biliary sepsis Active Problems:   Biliary obstruction   E coli bacteremia   Atrial fibrillation (HCC)   Tachy-brady syndrome (HCC)   Essential hypertension   Sinus node dysfunction - s/p St. Jude (serial number [id number]) PPM   Nausea & vomiting   Normocytic anemia   Fecal impaction ([place])  #1 biliary sepsis/biliary obstruction Patient with clinical improvement. Denies any abdominal pain. Unable to get MRCP due to pacemaker and not a candidate for ERCP per gastroenterology. Blood cultures positive for Escherichia coli and pansensitive. Continue IV Rocephin. Discontinue metronidazole. Patient for percutaneous drain placement today per interventional radiology hopefully cultures will be sent. Patient afebrile. LFTs trending down. Still with a leukocytosis however slowly improving. Check a 2-D echo to assure pacemaker leads are not infected. Continue empiric IV Rocephin. Consult with ID for antibiotic recommendations and duration. IV fluids. Supportive care. GI following.  #2 Escherichia coli bacteremia Likely seeded from the biliary system. Check a 2-D echo to assure pacemaker leads are  not infected. Bacteremia as pansensitive. Continue IV Rocephin. Consult with ID for antibiotic recommendations and duration. Supportive care.  #3 Atrial fibrillation/tachybradycardia syndrome status post PPM Currently rate controlled on Cardizem and metoprolol. Anticoagulation on hold for drain placement. IR to advise when anticoagulation may be resumed.  #4 fecal impaction Patient with bowel movements. Change MiraLAX to twice a day. At [facility] twice a day. Give soapsuds enema 1. Follow.  #5 hypertension Well-controlled on Cardizem and metoprolol. Follow.  #6 acute kidney injury Improving with hydration.  #7 metabolic acidosis Change IV fluids to bicarbonate.  #8 anemia Patient with no overt bleeding. Hemoglobin today is at 13.0 from 9.8. Follow H&H.    DVT prophylaxis: scds. Code Status: Full Family Communication: Updated patient and family at bedside. Disposition Plan: Home when medically stable, improvement with biliary sepsis, per GI.   Consultants:   Gastroenterology: Dr. Henrene Pastor 06/01/2016  Interventional radiology: Dr. Reesa Chew 06/03/2016  ID pending  Procedures:   CT abdomen and pelvis 05/31/2016  2-D echo pending  Biliary drain placement pending 06/04/2016  Antimicrobials:   IV Rocephin 06/03/2016  IV ciprofloxacin 06/01/2016>>>> 06/03/2016  IV Flagyl 06/02/2016>>>> 06/04/2016   Subjective: Patient denies any nausea or vomiting. Patient denies any significant abdominal pain. Patient denies any chest pain. No shortness of breath. Patient states she's hungry and would like procedure to be done so she can eat.  Objective: Vitals:   06/04/16 1300 06/04/16 1304 06/04/16 1330 06/04/16 1400  BP: 135/72 (!) 145/67 130/66 123/74  Pulse: 67 88 79 73  Resp: 19 19 18 18   Temp:   98.3 F (36.8 C) 98 F (36.7 C)  TempSrc:   Oral Oral  SpO2: 100% 96% 96% 98%  Weight:  Height:        Intake/Output Summary (Last 24 hours) at 06/04/16 1431 Last data  filed at 06/04/16 0522  Gross per 24 hour  Intake           1747.5 ml  Output              200 ml  Net           1547.5 ml   Filed Weights   05/31/16 1306  Weight: 53.5 kg (118 lb)    Examination:  General exam: Appears calm and comfortable. Frail. Cachectic.  Respiratory system: Clear to auscultation bilateraaly. Respiratory effort normal. Cardiovascular system: S1 & S2 heard, RRR. No JVD, murmurs, rubs, gallops or clicks. No pedal edema. Gastrointestinal system: Abdomen is nondistended, soft and nontender. No organomegaly or masses felt. Normal bowel sounds heard. Central nervous system: Alert and oriented. No focal neurological deficits. Extremities: Symmetric 5 x 5 power. Skin: No rashes, lesions or ulcers Psychiatry: Judgement and insight appear normal. Mood & affect appropriate.     Data Reviewed: I have personally reviewed following labs and imaging studies  CBC:  Recent Labs Lab 05/31/16 1308 06/01/16 0530 06/02/16 0352 06/03/16 0918 06/04/16 0352  WBC 9.0 13.0* 28.5* 34.3* 23.4*  NEUTROABS  --  10.6*  --   --  21.8*  HGB 9.8* 8.6* 9.1* 9.8* 13.0  HCT 29.2* 24.9* 26.9* 28.2* 39.8  MCV 98.6 98.0 98.5 95.3 95.2  PLT 152 136* 94* 123* 96*   Basic Metabolic Panel:  Recent Labs Lab 05/31/16 1308 06/01/16 0530 06/02/16 0352 06/03/16 0346 06/04/16 0352  NA 139 137 136 136 135  K 3.3* 3.7 3.9 3.6 3.7  CL 106 110 110 111 110  CO2 25 22 18* 19* 16*  GLUCOSE 99 86 87 98 105*  BUN 22* 18 25* 33* 27*  CREATININE 0.96 0.82 1.41* 1.35* 1.07*  CALCIUM 8.9 7.8* 7.3* 7.2* 7.4*   GFR: Estimated Creatinine Clearance: 26 mL/min (A) (by C-G formula based on SCr of 1.07 mg/dL (H)). Liver Function Tests:  Recent Labs Lab 05/31/16 1308 06/01/16 0530 06/02/16 0352 06/03/16 0346 06/04/16 0352  AST 200* 120* 142* 135* 78*  ALT 580* 385* 298* 245* 204*  ALKPHOS 558* 454* 450* 433* 471*  BILITOT 2.4* 1.3* 2.6* 1.3* 1.1  PROT 6.3* 5.5* 5.0* 4.8* 5.2*  ALBUMIN 3.2*  2.7* 2.3* 2.1* 2.3*    Recent Labs Lab 05/31/16 1523  LIPASE <10*   No results for input(s): AMMONIA in the last 168 hours. Coagulation Profile:  Recent Labs Lab 06/04/16 0352  INR 1.12   Cardiac Enzymes: No results for input(s): CKTOTAL, CKMB, CKMBINDEX, TROPONINI in the last 168 hours. BNP (last 3 results) No results for input(s): PROBNP in the last 8760 hours. HbA1C: No results for input(s): HGBA1C in the last 72 hours. CBG: No results for input(s): GLUCAP in the last 168 hours. Lipid Profile: No results for input(s): CHOL, HDL, LDLCALC, TRIG, CHOLHDL, LDLDIRECT in the last 72 hours. Thyroid Function Tests: No results for input(s): TSH, T4TOTAL, FREET4, T3FREE, THYROIDAB in the last 72 hours. Anemia Panel: No results for input(s): VITAMINB12, FOLATE, FERRITIN, TIBC, IRON, RETICCTPCT in the last 72 hours. Sepsis Labs: No results for input(s): PROCALCITON, LATICACIDVEN in the last 168 hours.  Recent Results (from the past 240 hour(s))  Culture, blood (routine x 2)     Status: Abnormal   Collection Time: 06/01/16  5:30 PM  Result Value Ref Range Status   Specimen Description BLOOD LEFT  HAND  Final   Special Requests IN PEDIATRIC BOTTLE Blood Culture adequate volume  Final   Culture  Setup Time   Final    GRAM NEGATIVE RODS IN PEDIATRIC BOTTLE CRITICAL VALUE NOTED.  VALUE IS CONSISTENT WITH PREVIOUSLY REPORTED AND CALLED VALUE.    Culture (A)  Final    ESCHERICHIA COLI SUSCEPTIBILITIES PERFORMED ON PREVIOUS CULTURE WITHIN THE LAST 5 DAYS. Performed at [facility], [place] [address], [place], [place] [address]    Report Status 06/04/2016 FINAL  Final  Culture, blood (routine x 2)     Status: Abnormal   Collection Time: 06/01/16  5:39 PM  Result Value Ref Range Status   Specimen Description BLOOD RIGHT HAND  Final   Special Requests IN PEDIATRIC BOTTLE Blood Culture adequate volume  Final   Culture  Setup Time   Final    GRAM NEGATIVE RODS AEROBIC BOTTLE  ONLY CRITICAL RESULT CALLED TO, READ BACK BY AND VERIFIED WITH: M. Lilliston Pharm.D. 17:50 06/02/16 ([email or web address]) Performed at [facility], [place] [address] [place], [place] [address]    Culture ESCHERICHIA COLI (A)  Final   Report Status 06/04/2016 FINAL  Final   Organism ID, Bacteria ESCHERICHIA COLI  Final      Susceptibility   Escherichia coli - MIC*    AMPICILLIN 4 SENSITIVE Sensitive     CEFAZOLIN <=4 SENSITIVE Sensitive     CEFEPIME <=1 SENSITIVE Sensitive     CEFTAZIDIME <=1 SENSITIVE Sensitive     CEFTRIAXONE <=1 SENSITIVE Sensitive     CIPROFLOXACIN <=0.25 SENSITIVE Sensitive     GENTAMICIN <=1 SENSITIVE Sensitive     IMIPENEM <=0.25 SENSITIVE Sensitive     TRIMETH/SULFA <=20 SENSITIVE Sensitive     AMPICILLIN/SULBACTAM <=2 SENSITIVE Sensitive     PIP/TAZO <=4 SENSITIVE Sensitive     Extended ESBL NEGATIVE Sensitive     * ESCHERICHIA COLI  Blood Culture ID Panel (Reflexed)     Status: Abnormal   Collection Time: 06/01/16  5:39 PM  Result Value Ref Range Status   Enterococcus species NOT DETECTED NOT DETECTED Final   Listeria monocytogenes NOT DETECTED NOT DETECTED Final   Staphylococcus species NOT DETECTED NOT DETECTED Final   Staphylococcus aureus NOT DETECTED NOT DETECTED Final   Streptococcus species NOT DETECTED NOT DETECTED Final   Streptococcus agalactiae NOT DETECTED NOT DETECTED Final   Streptococcus pneumoniae NOT DETECTED NOT DETECTED Final   Streptococcus pyogenes NOT DETECTED NOT DETECTED Final   Acinetobacter baumannii NOT DETECTED NOT DETECTED Final   Enterobacteriaceae species DETECTED (A) NOT DETECTED Final    Comment: Enterobacteriaceae represent a large family of gram-negative bacteria, not a single organism. CRITICAL RESULT CALLED TO, READ BACK BY AND VERIFIED WITH: M. Lilliston Pharm.D. 17:50 06/02/17 ([email or web address])    Enterobacter cloacae complex NOT DETECTED NOT DETECTED Final   Escherichia coli DETECTED (A) NOT DETECTED Final    Comment: CRITICAL  RESULT CALLED TO, READ BACK BY AND VERIFIED WITH: M. Lilliston Pharm.D. 17:50 06/02/16 ([email or web address])    Klebsiella oxytoca NOT DETECTED NOT DETECTED Final   Klebsiella pneumoniae NOT DETECTED NOT DETECTED Final   Proteus species NOT DETECTED NOT DETECTED Final   Serratia marcescens NOT DETECTED NOT DETECTED Final   Carbapenem resistance NOT DETECTED NOT DETECTED Final   Haemophilus influenzae NOT DETECTED NOT DETECTED Final   Neisseria meningitidis NOT DETECTED NOT DETECTED Final   Pseudomonas aeruginosa NOT DETECTED NOT DETECTED Final   Candida albicans NOT DETECTED NOT DETECTED Final  Candida glabrata NOT DETECTED NOT DETECTED Final   Candida krusei NOT DETECTED NOT DETECTED Final   Candida parapsilosis NOT DETECTED NOT DETECTED Final   Candida tropicalis NOT DETECTED NOT DETECTED Final    Comment: Performed at [facility], [place] [address] [place], [place] [address]  Culture, blood (routine x 2)     Status: None (Preliminary result)   Collection Time: 06/03/16 10:10 AM  Result Value Ref Range Status   Specimen Description BLOOD LEFT HAND  Final   Special Requests IN PEDIATRIC BOTTLE Blood Culture adequate volume  Final   Culture   Final    NO GROWTH < 24 HOURS Performed at [facility], [place] [address], [place], [place] [address]    Report Status PENDING  Incomplete  Culture, blood (routine x 2)     Status: None (Preliminary result)   Collection Time: 06/03/16 10:11 AM  Result Value Ref Range Status   Specimen Description BLOOD RIGHT HAND  Final   Special Requests IN PEDIATRIC BOTTLE Blood Culture adequate volume  Final   Culture   Final    NO GROWTH < 24 HOURS Performed at [facility], [place] [address], [place], [place] [address]    Report Status PENDING  Incomplete         Radiology Studies: Ir Int Lianne Cure Biliary Drain With Cholangiogram  Result Date: 06/04/2016 INDICATION: 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  Recurrent left biliary dilatation. EXAM: ULTRASOUND AND FLUOROSCOPIC LEFT INTERNAL EXTERNAL BILIARY DRAIN INSERTION MEDICATIONS: Patient is already receiving Rocephin IV as an inpatient. The antibiotic was administered within an appropriate time frame prior to the initiation of the procedure. ANESTHESIA/SEDATION: Moderate (conscious) sedation was employed during this procedure. A total of Versed 1.0 mg and Fentanyl 50 mcg was administered intravenously. Moderate Sedation Time: 23 minutes minutes. The patient's level of consciousness and vital signs were monitored continuously by radiology nursing throughout the procedure under my direct supervision. FLUOROSCOPY TIME:  Fluoroscopy Time: 4 minutes 12 seconds (46 mGy). COMPLICATIONS: None immediate. PROCEDURE: Informed written consent was obtained from the patient after a thorough discussion of the procedural risks, benefits and alternatives. All questions were addressed. Maximal Sterile Barrier Technique was utilized including caps, mask, sterile gowns, sterile gloves, sterile drape, hand hygiene and skin antiseptic. A timeout was performed prior to the initiation of the procedure. Previous imaging reviewed. Under sterile conditions and local anesthesia, ultrasound percutaneous needle access performed of a dilated left hepatic duct peripherally. There was return of bile. Contrast injection performed for a limited cholangiogram. Cholangiogram: Left biliary dilatation noted. Filling defects within the left hepatic duct compatible with choledocholithiasis. There is a central left hepatic duct stricture close to the biliary confluence. 018 guidewire advanced followed by the Accustick dilator set. This allowed insertion of a 5 [place] Kumpe catheter. Catheter and Bentson guidewire utilized to manipulate the access into the duodenum. Contrast injection confirms position in the duodenum. Over an Amplatz guidewire, tract dilatation performed to insert a 10 [place] internal  external biliary drain. Catheter position confirmed with fluoroscopy and contrast. Images obtained for documentation. Biliary drain secured with a Prolene suture. Catheter connected to external gravity drainage. Sterile dressing applied. No immediate complication. IMPRESSION: Successful ultrasound and fluoroscopic 10 French left internal external biliary drain insertion. Electronically Signed   By: Jerilynn Mages.  Shick M.D.   On: 06/04/2016 14:00        Scheduled Meds: . calcium-vitamin D  1 tablet Oral Q  breakfast  . [START ON 06/05/2016] Cyanocobalamin  500 mcg Each Nare Once per day on Sat  . diltiazem  120 mg Oral Daily  . fentaNYL      . fluticasone  2 spray Each Nare Daily  . LORazepam  1 mg Oral QHS  . metoprolol  50 mg Oral BID  . midazolam      . multivitamin with minerals  1 tablet Oral Daily  . omega-3 acid ethyl esters  1 g Oral Daily  . polyethylene glycol  17 g Oral BID  . senna-docusate  1 tablet Oral BID   Continuous Infusions: . sodium chloride 75 mL/hr at 06/04/16 0004  . cefTRIAXone (ROCEPHIN)  IV Stopped (06/04/16 0551)     LOS: 4 days    Time spent: 68 mins    THOMPSON,DANIEL, MD [facility] Pager [phone]  If 7PM-7AM, please contact night-coverage [email or web address] Password [facility] 06/04/2016, 2:31 PM

## 2016-06-04 NOTE — Progress Notes (Signed)
Patient refused enema earlier, states she just want to sleep, family made aware that SSE is available for her.

## 2016-06-05 ENCOUNTER — Inpatient Hospital Stay (HOSPITAL_COMMUNITY): Payer: Medicare Other

## 2016-06-05 DIAGNOSIS — Z950 Presence of cardiac pacemaker: Secondary | ICD-10-CM

## 2016-06-05 LAB — CBC WITH DIFFERENTIAL/PLATELET
Basophils Absolute: 0 10*3/uL (ref 0.0–0.1)
Basophils Relative: 0 %
Eosinophils Absolute: 0.1 10*3/uL (ref 0.0–0.7)
Eosinophils Relative: 1 %
HEMATOCRIT: 27.6 % — AB (ref 36.0–46.0)
HEMOGLOBIN: 9.8 g/dL — AB (ref 12.0–15.0)
LYMPHS ABS: 1.2 10*3/uL (ref 0.7–4.0)
Lymphocytes Relative: 6 %
MCH: 32.7 pg (ref 26.0–34.0)
MCHC: 35.5 g/dL (ref 30.0–36.0)
MCV: 92 fL (ref 78.0–100.0)
Monocytes Absolute: 1.4 10*3/uL — ABNORMAL HIGH (ref 0.1–1.0)
Monocytes Relative: 7 %
NEUTROS ABS: 17.5 10*3/uL — AB (ref 1.7–7.7)
NEUTROS PCT: 86 %
Platelets: 144 10*3/uL — ABNORMAL LOW (ref 150–400)
RBC: 3 MIL/uL — ABNORMAL LOW (ref 3.87–5.11)
RDW: 14 % (ref 11.5–15.5)
WBC: 20.3 10*3/uL — ABNORMAL HIGH (ref 4.0–10.5)

## 2016-06-05 LAB — COMPREHENSIVE METABOLIC PANEL
ALT: 130 U/L — AB (ref 14–54)
ANION GAP: 6 (ref 5–15)
AST: 40 U/L (ref 15–41)
Albumin: 2.1 g/dL — ABNORMAL LOW (ref 3.5–5.0)
Alkaline Phosphatase: 397 U/L — ABNORMAL HIGH (ref 38–126)
BUN: 21 mg/dL — ABNORMAL HIGH (ref 6–20)
CHLORIDE: 109 mmol/L (ref 101–111)
CO2: 22 mmol/L (ref 22–32)
CREATININE: 0.89 mg/dL (ref 0.44–1.00)
Calcium: 7.3 mg/dL — ABNORMAL LOW (ref 8.9–10.3)
GFR calc non Af Amer: 53 mL/min — ABNORMAL LOW (ref >60)
Glucose, Bld: 103 mg/dL — ABNORMAL HIGH (ref 65–99)
Potassium: 3.8 mmol/L (ref 3.5–5.1)
Sodium: 137 mmol/L (ref 135–145)
Total Bilirubin: 0.9 mg/dL (ref 0.3–1.2)
Total Protein: 5.1 g/dL — ABNORMAL LOW (ref 6.5–8.1)

## 2016-06-05 LAB — ECHOCARDIOGRAM COMPLETE
FS: 25 % — AB (ref 28–44)
HEIGHTINCHES: 65 in
IVS/LV PW RATIO, ED: 0.94
LA ID, A-P, ES: 40 mm
LA diam end sys: 40 mm
LA vol A4C: 48.8 ml
LA vol index: 33.1 mL/m2
LADIAMINDEX: 2.53 cm/m2
LAVOL: 52.3 mL
LDCA: 3.46 cm2
LV PW d: 9.37 mm — AB (ref 0.6–1.1)
LVOTD: 21 mm
Weight: 1888 oz

## 2016-06-05 LAB — MAGNESIUM: Magnesium: 1.7 mg/dL (ref 1.7–2.4)

## 2016-06-05 LAB — HIV ANTIBODY (ROUTINE TESTING W REFLEX): HIV Screen 4th Generation wRfx: NONREACTIVE

## 2016-06-05 MED ORDER — MAGNESIUM OXIDE 400 (241.3 MG) MG PO TABS
400.0000 mg | ORAL_TABLET | Freq: Three times a day (TID) | ORAL | Status: DC
  Administered 2016-06-05 – 2016-06-07 (×6): 400 mg via ORAL
  Filled 2016-06-05 (×6): qty 1

## 2016-06-05 NOTE — Progress Notes (Signed)
Referring Physician(s): Dr Rikki Spearing Dr Cline Cools  Supervising Physician: Jacqulynn Cadet  Patient Status:  [name] - In-pt  Chief Complaint:  Biliary obstruction  Subjective:  Biliary drain placed in IR 5/11 draining well Pt feels some better but sore  Allergies: Patient has no known allergies.  Medications: Prior to Admission medications   Medication Sig Start Date End Date Taking? Authorizing Provider  acetaminophen (TYLENOL) 500 MG tablet Take 500 mg by mouth every 6 (six) hours as needed for mild pain.   Yes [provider]  atorvastatin (LIPITOR) 20 MG tablet TAKE 1 TABLET (20 MG TOTAL) BY MOUTH AT BEDTIME. 05/12/16  Yes Evans Lance, MD  calcium-vitamin D (OSCAL WITH D) 500-200 MG-UNIT per tablet Take 1 tablet by mouth daily with breakfast.   Yes [provider]  Collagenase (SANTYL EX) Apply 1 application topically daily.   Yes [provider]  Cyanocobalamin (NASCOBAL NA) Place 1 spray into both nostrils once a week. saturday   Yes [provider]  denosumab (PROLIA) 60 MG/ML SOLN injection Inject 60 mg into the skin every 6 (six) months. Administer in upper arm, thigh, or abdomen   Yes [provider]  diltiazem (CARDIZEM CD) 120 MG 24 hr capsule TAKE 1 CAPSULE (120 MG TOTAL) BY MOUTH DAILY. 06/04/15  Yes Barrett, Rhonda G, PA-C  ELIQUIS 2.5 MG TABS tablet TAKE 1 TABLET BY MOUTH TWICE A DAY 11/25/15  Yes Barrett, Rhonda G, PA-C  ferrous sulfate (SLOW FE) 160 (50 FE) MG TBCR SR tablet Take 160 mg by mouth daily.   Yes [provider]  hydrochlorothiazide (HYDRODIURIL) 25 MG tablet Take 12.5 mg by mouth daily.   Yes [provider]  KLOR-CON 10 10 MEQ tablet Take 10 mEq by mouth daily. 02/17/15  Yes [provider]  LORazepam (ATIVAN) 1 MG tablet Take 1 mg by mouth at bedtime.   Yes [provider]  metoprolol (LOPRESSOR) 50 MG tablet Take 50 mg by mouth 2 (two) times daily.   Yes [provider]  mometasone (NASONEX) 50 MCG/ACT nasal spray Place 2 sprays into both nostrils daily.   Yes [provider]  Multiple Vitamins-Minerals (MULTIVITAMIN WITH MINERALS) tablet Take 1 tablet by mouth daily.   Yes [provider]  NASCOBAL 500 MCG/0.1ML SOLN Place 500 mcg into both nostrils as directed. 04/14/15  Yes [provider]  Omega-3 Fatty Acids (FISH OIL) 1000 MG CPDR Take 1,000 mg by mouth daily.   Yes [provider]  Polyethyl Glycol-Propyl Glycol 0.4-0.3 % SOLN Apply 1 drop to eye daily as needed (dryness).   Yes [provider]  potassium chloride (K-DUR,KLOR-CON) 10 MEQ tablet Take 10 mEq by mouth daily.   Yes [provider]  sucralfate (CARAFATE) 1 G tablet Take 1 g by mouth 4 (four) times daily -  before meals and at bedtime.   Yes [provider]  traMADol (ULTRAM) 50 MG tablet Take 50-100 mg by mouth 3 (three) times daily as needed (pain).   Yes [provider]  Vitamin D, Ergocalciferol, (DRISDOL) 50000 UNITS CAPS capsule Take 1 capsule by mouth once a week. 08/05/14  Yes [provider]     Vital Signs: BP 132/84 (BP Location: Right Arm)   Pulse 83   Temp 98.9 F (37.2 C) (Oral)   Resp 20   Ht 5\' 5"  (1.651 m)   Wt 118 lb (53.5 kg)   SpO2 95%   BMI 19.64 kg/m  Physical Exam  Constitutional: She is oriented to person, place, and time.  Abdominal: Soft. Bowel sounds are normal.  Musculoskeletal: Normal range of motion.  Neurological: She is alert and oriented to person, place, and time.  Skin: Skin is warm and dry.  Skin site is clean and dry NT no bleeding Output 625 cc yesterday 400 cc in bag now: bile  Afeb Wbc 20.3 (23.4)   Psychiatric: She has a normal mood and affect. Her behavior is normal.  Nursing note and vitals reviewed.   Imaging: Ir Int Lianne Cure Biliary Drain With Cholangiogram  Result Date: 06/04/2016 INDICATION: History of left hepatic duct stricture at  the confluence with choledocholithiasis. Recurrent left biliary dilatation. EXAM: ULTRASOUND AND FLUOROSCOPIC LEFT INTERNAL EXTERNAL BILIARY DRAIN INSERTION MEDICATIONS: Patient is already receiving Rocephin IV as an inpatient. The antibiotic was administered within an appropriate time frame prior to the initiation of the procedure. ANESTHESIA/SEDATION: Moderate (conscious) sedation was employed during this procedure. A total of Versed 1.0 mg and Fentanyl 50 mcg was administered intravenously. Moderate Sedation Time: 23 minutes minutes. The patient's level of consciousness and vital signs were monitored continuously by radiology nursing throughout the procedure under my direct supervision. FLUOROSCOPY TIME:  Fluoroscopy Time: 4 minutes 12 seconds (46 mGy). COMPLICATIONS: None immediate. PROCEDURE: Informed written consent was obtained from the patient after a thorough discussion of the procedural risks, benefits and alternatives. All questions were addressed. Maximal Sterile Barrier Technique was utilized including caps, mask, sterile gowns, sterile gloves, sterile drape, hand hygiene and skin antiseptic. A timeout was performed prior to the initiation of the procedure. Previous imaging reviewed. Under sterile conditions and local anesthesia, ultrasound percutaneous needle access performed of a dilated left hepatic duct peripherally. There was return of bile. Contrast injection performed for a limited cholangiogram. Cholangiogram: Left biliary dilatation noted. Filling defects within the left hepatic duct compatible with choledocholithiasis. There is a central left hepatic duct stricture close to the biliary confluence. 018 guidewire advanced followed by the Accustick dilator set. This allowed insertion of a 5 [place] Kumpe catheter. Catheter and Bentson guidewire utilized to manipulate the access into the duodenum. Contrast injection confirms position in the duodenum. Over an Amplatz guidewire, tract dilatation  performed to insert a 10 [place] internal external biliary drain. Catheter position confirmed with fluoroscopy and contrast. Images obtained for documentation. Biliary drain secured with a Prolene suture. Catheter connected to external gravity drainage. Sterile dressing applied. No immediate complication. IMPRESSION: Successful ultrasound and fluoroscopic 10 French left internal external biliary drain insertion. Electronically Signed   By: Jerilynn Mages.  Shick M.D.   On: 06/04/2016 14:00    Labs:  CBC:  Recent Labs  06/02/16 0352 06/03/16 0918 06/04/16 0352 06/05/16 0432  WBC 28.5* 34.3* 23.4* 20.3*  HGB 9.1* 9.8* 13.0 9.8*  HCT 26.9* 28.2* 39.8 27.6*  PLT 94* 123* 96* 144*    COAGS:  Recent Labs  05/31/16 2150 06/01/16 0530 06/01/16 1735 06/02/16 0352 06/04/16 0352  INR  --   --   --   --  1.12  APTT >200* >200* 136* 77*  --     BMP:  Recent Labs  06/02/16 0352 06/03/16 0346 06/04/16 0352 06/05/16 0432  NA 136 136 135 137  K 3.9 3.6 3.7 3.8  CL 110 111 110 109  CO2 18* 19* 16* 22  GLUCOSE 87 98 105* 103*  BUN 25* 33* 27* 21*  CALCIUM 7.3* 7.2* 7.4* 7.3*  CREATININE 1.41* 1.35* 1.07* 0.89  GFRNONAA 30* 32* 42* 53*  GFRAA 35* 37* 49* >60    LIVER FUNCTION TESTS:  Recent Labs  06/02/16 0352 06/03/16 0346 06/04/16 0352 06/05/16 0432  BILITOT 2.6* 1.3* 1.1 0.9  AST 142* 135* 78* 40  ALT 298* 245* 204* 130*  ALKPHOS 450* 433* 471* 397*  PROT 5.0* 4.8* 5.2* 5.1*  ALBUMIN 2.3* 2.1* 2.3* 2.1*    Assessment and Plan:  Biliary drain placed 5/11 Draining well Will follow  Electronically Signed: Quamesha Mullet A 06/05/2016, 1:30 PM   I spent a total of 15 Minutes at the the patient's bedside AND on the patient's hospital floor or unit, greater than 50% of which was counseling/coordinating care for biliary drain

## 2016-06-05 NOTE — Progress Notes (Signed)
PROGRESS NOTE    [name] [name]  CHY:[id number] DOB: [birth year] DOA: 05/31/2016 PCP: Crist Infante, MD    Brief Narrative:  83 yo female presents with the chief complain of nausea and vomiting. Patient know to have atrial fibrillation and remote history of gastric cancer sp resection (Bilroth II). Symptoms present for 4 days before admission. Had cholecystectomy in the past. On the initial examination, patient was hemodynamically stable, in pian with a nontender abdomen. AST -ALT were elevated at 580-200 with Alkaline phos 558 and bilirubins at 2.4. CT with positive intrahepatic biliary duct dilatation. Admitted with sepsis from biliary origin. Blood culture positive for E. Coli. Patient not candidate for ERCP, consulted IR percutaneous biliary drain placed on 06/04/2016. Antibiotic therapy changed to Unasyn.    Assessment & Plan:   Principal Problem:   Biliary sepsis Active Problems:   Atrial fibrillation (HCC)   Tachy-brady syndrome (HCC)   Essential hypertension   Sinus node dysfunction - s/p St. Jude (serial number [id number]) PPM   Nausea & vomiting   Biliary obstruction   Normocytic anemia   E coli bacteremia   Fecal impaction ([place])   Pacemaker    1. Biliary obstruction with gram negative bacteremia (E coli) and sepsis (present on admission). Percutaneous drain has been placed with good response, no further pain, nausea or vomiting. Output 625 of bilious fluid. Patient had remained afebrile, white cell count down to 20 from 23. Follow blood cultures no growth, continue Unasyn per ID recommendations. E coli is pan-sensitive including ampicillin. Likely patient can be discharged on Augmentin.   2. Atrial fibrillation . Continue rate control with diltiazem 120 mg and metoprolol 50 mg bid. Resumed anticoagulation with apixaban.    3. AKI. Renal function with cr at 0.89 with K at 3,8 and serum bicarbonate at 22. Will continue to follow on renal panel and electrolytes, avoid hypotension  or nephrotoxic medications. Patient tolerating po well, patient off IV fluids.   4. HTN. Blood pressure systolic 209 to 470, tolerating well AV blockade.   5. Anemia. Cell count with hb at 9,8 and hct 27,6. No signs of bleeding.     DVT prophylaxis:heparin  Code Status:full  Family Communication: Disposition Plan:home   Consultants:  Gastroenterology     Procedures:    Antimicrobials:  Unasyn  #1    Subjective: Patient feeling better, no nausea or vomiting, tolerating po well, out of bed to chair. Biliary drain has been placed, no nausea or vomiting.   Objective: Vitals:   06/04/16 1430 06/04/16 1500 06/04/16 2119 06/05/16 0458  BP: 129/68 120/66 137/63 132/84  Pulse: 81 87 79 83  Resp: 17 18 20 20   Temp: 98 F (36.7 C) 98 F (36.7 C) 98.5 F (36.9 C) 98.9 F (37.2 C)  TempSrc: Oral Oral Oral Oral  SpO2: 97% 91% 96% 95%  Weight:      Height:        Intake/Output Summary (Last 24 hours) at 06/05/16 0853 Last data filed at 06/05/16 0409  Gross per 24 hour  Intake            837.5 ml  Output              625 ml  Net            212.5 ml   Filed Weights   05/31/16 1306  Weight: 53.5 kg (118 lb)    Examination:  General exam: Deconditioned, not in pain or dyspnea E ENT: mild pallor, no  icterus, oral mucosa moist.  Respiratory system: Clear to auscultation. Mild decreased breath sounds at bases, no wheezing, rales or rhonchi.  Cardiovascular system: S1 & S2 heard, RRR. No JVD, murmurs, rubs, gallops or clicks. No pedal edema. Gastrointestinal system: Abdomen is nondistended, soft and nontender. No organomegaly or masses felt. Normal bowel sounds heard. Biliary drain placed, bag with bilious fluid present.  Central nervous system: Alert. No focal neurological deficits. Extremities: Symmetric 5 x 5 power. Skin: No rashes, lesions or ulcers     Data Reviewed: I have personally reviewed following labs and imaging studies  CBC:  Recent  Labs Lab 06/01/16 0530 06/02/16 0352 06/03/16 0918 06/04/16 0352 06/05/16 0432  WBC 13.0* 28.5* 34.3* 23.4* 20.3*  NEUTROABS 10.6*  --   --  21.8* 17.5*  HGB 8.6* 9.1* 9.8* 13.0 9.8*  HCT 24.9* 26.9* 28.2* 39.8 27.6*  MCV 98.0 98.5 95.3 95.2 92.0  PLT 136* 94* 123* 96* 220*   Basic Metabolic Panel:  Recent Labs Lab 06/01/16 0530 06/02/16 0352 06/03/16 0346 06/04/16 0352 06/05/16 0432  NA 137 136 136 135 137  K 3.7 3.9 3.6 3.7 3.8  CL 110 110 111 110 109  CO2 22 18* 19* 16* 22  GLUCOSE 86 87 98 105* 103*  BUN 18 25* 33* 27* 21*  CREATININE 0.82 1.41* 1.35* 1.07* 0.89  CALCIUM 7.8* 7.3* 7.2* 7.4* 7.3*  MG  --   --   --   --  1.7   GFR: Estimated Creatinine Clearance: 31.2 mL/min (by C-G formula based on SCr of 0.89 mg/dL). Liver Function Tests:  Recent Labs Lab 06/01/16 0530 06/02/16 0352 06/03/16 0346 06/04/16 0352 06/05/16 0432  AST 120* 142* 135* 78* 40  ALT 385* 298* 245* 204* 130*  ALKPHOS 454* 450* 433* 471* 397*  BILITOT 1.3* 2.6* 1.3* 1.1 0.9  PROT 5.5* 5.0* 4.8* 5.2* 5.1*  ALBUMIN 2.7* 2.3* 2.1* 2.3* 2.1*    Recent Labs Lab 05/31/16 1523  LIPASE <10*   No results for input(s): AMMONIA in the last 168 hours. Coagulation Profile:  Recent Labs Lab 06/04/16 0352  INR 1.12   Cardiac Enzymes: No results for input(s): CKTOTAL, CKMB, CKMBINDEX, TROPONINI in the last 168 hours. BNP (last 3 results) No results for input(s): PROBNP in the last 8760 hours. HbA1C: No results for input(s): HGBA1C in the last 72 hours. CBG: No results for input(s): GLUCAP in the last 168 hours. Lipid Profile: No results for input(s): CHOL, HDL, LDLCALC, TRIG, CHOLHDL, LDLDIRECT in the last 72 hours. Thyroid Function Tests: No results for input(s): TSH, T4TOTAL, FREET4, T3FREE, THYROIDAB in the last 72 hours. Anemia Panel: No results for input(s): VITAMINB12, FOLATE, FERRITIN, TIBC, IRON, RETICCTPCT in the last 72 hours. Sepsis Labs: No results for input(s):  PROCALCITON, LATICACIDVEN in the last 168 hours.  Recent Results (from the past 240 hour(s))  Culture, blood (routine x 2)     Status: Abnormal   Collection Time: 06/01/16  5:30 PM  Result Value Ref Range Status   Specimen Description BLOOD LEFT HAND  Final   Special Requests IN PEDIATRIC BOTTLE Blood Culture adequate volume  Final   Culture  Setup Time   Final    GRAM NEGATIVE RODS IN PEDIATRIC BOTTLE CRITICAL VALUE NOTED.  VALUE IS CONSISTENT WITH PREVIOUSLY REPORTED AND CALLED VALUE.    Culture (A)  Final    ESCHERICHIA COLI SUSCEPTIBILITIES PERFORMED ON PREVIOUS CULTURE WITHIN THE LAST 5 DAYS. Performed at [facility], [place] [address]  [place], [place] [address]    Report Status 06/04/2016 FINAL  Final  Culture, blood (routine x 2)     Status: Abnormal   Collection Time: 06/01/16  5:39 PM  Result Value Ref Range Status   Specimen Description BLOOD RIGHT HAND  Final   Special Requests IN PEDIATRIC BOTTLE Blood Culture adequate volume  Final   Culture  Setup Time   Final    GRAM NEGATIVE RODS AEROBIC BOTTLE ONLY CRITICAL RESULT CALLED TO, READ BACK BY AND VERIFIED WITH: M. Lilliston Pharm.D. 17:50 06/02/16 ([email or web address]) Performed at [facility], [place] [address] [place], [place] [address]    Culture ESCHERICHIA COLI (A)  Final   Report Status 06/04/2016 FINAL  Final   Organism ID, Bacteria ESCHERICHIA COLI  Final      Susceptibility   Escherichia coli - MIC*    AMPICILLIN 4 SENSITIVE Sensitive     CEFAZOLIN <=4 SENSITIVE Sensitive     CEFEPIME <=1 SENSITIVE Sensitive     CEFTAZIDIME <=1 SENSITIVE Sensitive     CEFTRIAXONE <=1 SENSITIVE Sensitive     CIPROFLOXACIN <=0.25 SENSITIVE Sensitive     GENTAMICIN <=1 SENSITIVE Sensitive     IMIPENEM <=0.25 SENSITIVE Sensitive     TRIMETH/SULFA <=20 SENSITIVE Sensitive     AMPICILLIN/SULBACTAM <=2 SENSITIVE Sensitive     PIP/TAZO <=4 SENSITIVE Sensitive     Extended ESBL NEGATIVE Sensitive     * ESCHERICHIA COLI  Blood  Culture ID Panel (Reflexed)     Status: Abnormal   Collection Time: 06/01/16  5:39 PM  Result Value Ref Range Status   Enterococcus species NOT DETECTED NOT DETECTED Final   Listeria monocytogenes NOT DETECTED NOT DETECTED Final   Staphylococcus species NOT DETECTED NOT DETECTED Final   Staphylococcus aureus NOT DETECTED NOT DETECTED Final   Streptococcus species NOT DETECTED NOT DETECTED Final   Streptococcus agalactiae NOT DETECTED NOT DETECTED Final   Streptococcus pneumoniae NOT DETECTED NOT DETECTED Final   Streptococcus pyogenes NOT DETECTED NOT DETECTED Final   Acinetobacter baumannii NOT DETECTED NOT DETECTED Final   Enterobacteriaceae species DETECTED (A) NOT DETECTED Final    Comment: Enterobacteriaceae represent a large family of gram-negative bacteria, not a single organism. CRITICAL RESULT CALLED TO, READ BACK BY AND VERIFIED WITH: M. Lilliston Pharm.D. 17:50 06/02/17 ([email or web address])    Enterobacter cloacae complex NOT DETECTED NOT DETECTED Final   Escherichia coli DETECTED (A) NOT DETECTED Final    Comment: CRITICAL RESULT CALLED TO, READ BACK BY AND VERIFIED WITH: M. Lilliston Pharm.D. 17:50 06/02/16 (wilsonm)    Klebsiella oxytoca NOT DETECTED NOT DETECTED Final   Klebsiella pneumoniae NOT DETECTED NOT DETECTED Final   Proteus species NOT DETECTED NOT DETECTED Final   Serratia marcescens NOT DETECTED NOT DETECTED Final   Carbapenem resistance NOT DETECTED NOT DETECTED Final   Haemophilus influenzae NOT DETECTED NOT DETECTED Final   Neisseria meningitidis NOT DETECTED NOT DETECTED Final   Pseudomonas aeruginosa NOT DETECTED NOT DETECTED Final   Candida albicans NOT DETECTED NOT DETECTED Final   Candida glabrata NOT DETECTED NOT DETECTED Final   Candida krusei NOT DETECTED NOT DETECTED Final   Candida parapsilosis NOT DETECTED NOT DETECTED Final   Candida tropicalis NOT DETECTED NOT DETECTED Final    Comment: Performed at [facility], [place] [address] [place], [place]  [address]  Culture, blood (routine x 2)     Status: None (Preliminary result)   Collection Time: 06/03/16 10:10 AM  Result Value Ref Range Status  Specimen Description BLOOD LEFT HAND  Final   Special Requests IN PEDIATRIC BOTTLE Blood Culture adequate volume  Final   Culture   Final    NO GROWTH < 24 HOURS Performed at [facility], [place] [address], [place], [place] [address]    Report Status PENDING  Incomplete  Culture, blood (routine x 2)     Status: None (Preliminary result)   Collection Time: 06/03/16 10:11 AM  Result Value Ref Range Status   Specimen Description BLOOD RIGHT HAND  Final   Special Requests IN PEDIATRIC BOTTLE Blood Culture adequate volume  Final   Culture   Final    NO GROWTH < 24 HOURS Performed at [facility], [place] [address], [place], [place] [address]    Report Status PENDING  Incomplete         Radiology Studies: Ir Int Lianne Cure Biliary Drain With Cholangiogram  Result Date: 06/04/2016 INDICATION: 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 Recurrent left biliary dilatation. EXAM: ULTRASOUND AND FLUOROSCOPIC LEFT INTERNAL EXTERNAL BILIARY DRAIN INSERTION MEDICATIONS: Patient is already receiving Rocephin IV as an inpatient. The antibiotic was administered within an appropriate time frame prior to the initiation of the procedure. ANESTHESIA/SEDATION: Moderate (conscious) sedation was employed during this procedure. A total of Versed 1.0 mg and Fentanyl 50 mcg was administered intravenously. Moderate Sedation Time: 23 minutes minutes. The patient's level of consciousness and vital signs were monitored continuously by radiology nursing throughout the procedure under my direct supervision. FLUOROSCOPY TIME:  Fluoroscopy Time: 4 minutes 12 seconds (46 mGy). COMPLICATIONS: None immediate. PROCEDURE: Informed written consent was obtained from the patient after a thorough discussion of the procedural risks, benefits and  alternatives. All questions were addressed. Maximal Sterile Barrier Technique was utilized including caps, mask, sterile gowns, sterile gloves, sterile drape, hand hygiene and skin antiseptic. A timeout was performed prior to the initiation of the procedure. Previous imaging reviewed. Under sterile conditions and local anesthesia, ultrasound percutaneous needle access performed of a dilated left hepatic duct peripherally. There was return of bile. Contrast injection performed for a limited cholangiogram. Cholangiogram: Left biliary dilatation noted. Filling defects within the left hepatic duct compatible with choledocholithiasis. There is a central left hepatic duct stricture close to the biliary confluence. 018 guidewire advanced followed by the Accustick dilator set. This allowed insertion of a 5 [place] Kumpe catheter. Catheter and Bentson guidewire utilized to manipulate the access into the duodenum. Contrast injection confirms position in the duodenum. Over an Amplatz guidewire, tract dilatation performed to insert a 10 [place] internal external biliary drain. Catheter position confirmed with fluoroscopy and contrast. Images obtained for documentation. Biliary drain secured with a Prolene suture. Catheter connected to external gravity drainage. Sterile dressing applied. No immediate complication. IMPRESSION: Successful ultrasound and fluoroscopic 10 French left internal external biliary drain insertion. Electronically Signed   By: Jerilynn Mages.  Shick M.D.   On: 06/04/2016 14:00        Scheduled Meds: . apixaban  2.5 mg Oral BID  . calcium-vitamin D  1 tablet Oral Q breakfast  . Cyanocobalamin  500 mcg Each Nare Once per day on Sat  . diltiazem  120 mg Oral Daily  . fluticasone  2 spray Each Nare Daily  . loratadine  10 mg Oral Daily  . LORazepam  1 mg Oral QHS  . metoprolol  50 mg Oral BID  . multivitamin with minerals  1 tablet Oral Daily  . omega-3 acid ethyl esters  1  g Oral Daily  . polyethylene  glycol  17 g Oral BID  . senna-docusate  1 tablet Oral BID   Continuous Infusions: . ampicillin-sulbactam (UNASYN) IV Stopped (06/05/16 0525)  .  sodium bicarbonate (isotonic) infusion in sterile water 75 mL/hr at 06/05/16 0817     LOS: 5 days       Lennie Vasco Gerome Apley, MD [facility] Pager [phone]  If 7PM-7AM, please contact night-coverage [email or web address] Password [facility] 06/05/2016, 8:53 AM

## 2016-06-05 NOTE — Progress Notes (Signed)
[facility] Progress Note  Chief Complaint:   Biliary obstruction  Subjective: Feels fine as usual. Slept last night. No abdominal pain. Appetite suboptimal.   Objective:  Vital signs in last 24 hours: Temp:  [98 F (36.7 C)-98.9 F (37.2 C)] 98.9 F (37.2 C) (05/12 0458) Pulse Rate:  [67-95] 83 (05/12 0458) Resp:  [17-22] 20 (05/12 0458) BP: (120-145)/(63-84) 132/84 (05/12 0458) SpO2:  [91 %-100 %] 95 % (05/12 0458) Last BM Date: 06/03/16 General:   Alert, well-developed, white female NAD EENT:  Normal hearing, non icteric sclera, conjunctive pink.  Heart:  Regular rate, irreg rhythm, no lower extremity edema Pulm: Normal respiratory effort. Abdomen:  Soft, nondistended, nontender.  Normal bowel sounds, no masses felt. Neurologic:  Alert and  oriented x4;  grossly normal neurologically. Psych:  Alert and cooperative. Normal mood and affect.    Intake/Output from previous day: 05/11 0701 - 05/12 0700 In: 837.5 [P.O.:690; I.V.:137.5] Out: 625 [Drains:625] Intake/Output this shift: No intake/output data recorded.  Lab Results:  Recent Labs  06/03/16 0918 06/04/16 0352 06/05/16 0432  WBC 34.3* 23.4* 20.3*  HGB 9.8* 13.0 9.8*  HCT 28.2* 39.8 27.6*  PLT 123* 96* 144*   BMET  Recent Labs  06/03/16 0346 06/04/16 0352 06/05/16 0432  NA 136 135 137  K 3.6 3.7 3.8  CL 111 110 109  CO2 19* 16* 22  GLUCOSE 98 105* 103*  BUN 33* 27* 21*  CREATININE 1.35* 1.07* 0.89  CALCIUM 7.2* 7.4* 7.3*   LFT  Recent Labs  06/04/16 0352 06/05/16 0432  PROT 5.2* 5.1*  ALBUMIN 2.3* 2.1*  AST 78* 40  ALT 204* 130*  ALKPHOS 471* 397*  BILITOT 1.1 0.9  BILIDIR 0.5  --   IBILI 0.6  --    PT/INR  Recent Labs  06/04/16 0352  LABPROT 14.5  INR 1.12   Hepatitis Panel No results for input(s): HEPBSAG, HCVAB, HEPAIGM, HEPBIGM in the last 72 hours.  Ir Int Lianne Cure Biliary Drain With Cholangiogram  Result Date: 06/04/2016 INDICATION: History of left  hepatic duct stricture at the confluence with choledocholithiasis. Recurrent left biliary dilatation. EXAM: ULTRASOUND AND FLUOROSCOPIC LEFT INTERNAL EXTERNAL BILIARY DRAIN INSERTION MEDICATIONS: Patient is already receiving Rocephin IV as an inpatient. The antibiotic was administered within an appropriate time frame prior to the initiation of the procedure. ANESTHESIA/SEDATION: Moderate (conscious) sedation was employed during this procedure. A total of Versed 1.0 mg and Fentanyl 50 mcg was administered intravenously. Moderate Sedation Time: 23 minutes minutes. The patient's level of consciousness and vital signs were monitored continuously by radiology nursing throughout the procedure under my direct supervision. FLUOROSCOPY TIME:  Fluoroscopy Time: 4 minutes 12 seconds (46 mGy). COMPLICATIONS: None immediate. PROCEDURE: Informed written consent was obtained from the patient after a thorough discussion of the procedural risks, benefits and alternatives. All questions were addressed. Maximal Sterile Barrier Technique was utilized including caps, mask, sterile gowns, sterile gloves, sterile drape, hand hygiene and skin antiseptic. A timeout was performed prior to the initiation of the procedure. Previous imaging reviewed. Under sterile conditions and local anesthesia, ultrasound percutaneous needle access performed of a dilated left hepatic duct peripherally. There was return of bile. Contrast injection performed for a limited cholangiogram. Cholangiogram: Left biliary dilatation noted. Filling defects within the left hepatic duct compatible with choledocholithiasis. There is a central left hepatic duct stricture close to the biliary confluence. 018 guidewire advanced followed by the Accustick dilator set. This allowed insertion of a 5 [place] Kumpe  catheter. Catheter and Bentson guidewire utilized to manipulate the access into the duodenum. Contrast injection confirms position in the duodenum. Over an Amplatz  guidewire, tract dilatation performed to insert a 10 [place] internal external biliary drain. Catheter position confirmed with fluoroscopy and contrast. Images obtained for documentation. Biliary drain secured with a Prolene suture. Catheter connected to external gravity drainage. Sterile dressing applied. No immediate complication. IMPRESSION: Successful ultrasound and fluoroscopic 10 French left internal external biliary drain insertion. Electronically Signed   By: Jerilynn Mages.  Shick M.D.   On: 06/04/2016 14:00    Assessment / Plan:  1. 83 yo female with bilroth II anatomy presenting with intrahepatic biliary obstruction / E.coli bacteremia. She is s/p percutaneous drainage of the left system by IR yesterday. LFTs continue to improve.  -ID managing IV antibiotics, changed to Unasyn to lower risk of CDI. She will need two weeks of IV antibiotics given risk of PM seeding.   2. AFIB, on Eliquis   Principal Problem:   Biliary sepsis Active Problems:   Atrial fibrillation ([place])   Tachy-brady syndrome ([place])   Essential hypertension   Sinus node dysfunction - s/p St. Jude (serial number [id number]) PPM   Nausea & vomiting   Biliary obstruction   Normocytic anemia   E coli bacteremia   Fecal impaction ([place])   Pacemaker   LOS: 5 days   Tye Savoy NP 06/05/2016, 10:30 AM  Pager number [phone]  GI ATTENDING  History, laboratories, no other interval information reviewed. Patient seen and examined. Agree with interval progress note. Doing well after her percutaneous procedure. Continue antibiotics and monitoring of blood cell count and liver tests. Diet as tolerated.  Docia Chuck. Geri [place]., M.D. [facility] Division of Gastroenterology

## 2016-06-05 NOTE — Evaluation (Signed)
Occupational Therapy Evaluation Patient Details Name: [name] [name] MRN: [id number] DOB: 07-22-1917 Today's Date: 06/05/2016    History of Present Illness  [name] [name] is a 83 y.o. female with medical history significant for cancer of the stomach status post remote resection, tachybradycardia syndrome status post pacer placement, hypertension, and chronic atrial fibrillation on Eliquis who presents to the emergency department for evaluation of nausea and vomiting-->hepatic biliary obstruction.  s/p biliary drain 06/04/16   Clinical Impression   Pt was admitted for the above. At baseline, she performs basic ADLs at a mod I level and gets her own breakfast. Daughter assists with IADLs.  Pt was limited by pain and will benefit from continued OT to increase safety and independence with adls. Goals in acute are for min A overall.      Follow Up Recommendations  Supervision/Assistance - 24 hour;Home health OT (as long as pt progresses where she can go home).  2 daughters will assist   Equipment Recommendations  None recommended by OT    Recommendations for Other Services       Precautions / Restrictions Precautions Precautions: Fall Precaution Comments: biliary drain Restrictions Weight Bearing Restrictions: No      Mobility Bed Mobility         Supine to sit: Mod assist Sit to supine: Mod assist   General bed mobility comments: used HOB raised to decrease stress during bed mobility:  mod A to get to EOB and back into bed.    Transfers                      Balance                                           ADL either performed or assessed with clinical judgement   ADL Overall ADL's : Needs assistance/impaired Eating/Feeding: Independent   Grooming: Set up;Sitting   Upper Body Bathing: Set up;Sitting   Lower Body Bathing: Maximal assistance;Sitting/lateral leans;Bed level (vs)   Upper Body Dressing : Minimal assistance;Sitting   Lower  Body Dressing: Total assistance;Sitting/lateral leans;Bed level (vs)                 General ADL Comments: Pt sat up at EOB but was very uncomfortable.  Did not want to transfer to chair. She has urinary incontinence--cleaned up     Vision         Perception     Praxis      Pertinent Vitals/Pain Pain Assessment: Faces Faces Pain Scale: Hurts whole lot Pain Location: abdomen Pain Descriptors / Indicators: Aching Pain Intervention(s): Limited activity within patient's tolerance;Monitored during session;Repositioned;Patient requesting pain meds-RN notified     Hand Dominance     Extremity/Trunk Assessment Upper Extremity Assessment Upper Extremity Assessment: Overall WFL for tasks assessed       Cervical / Trunk Assessment Cervical / Trunk Assessment: Kyphotic   Communication Communication Communication: No difficulties   Cognition Arousal/Alertness: Awake/alert Behavior During Therapy: WFL for tasks assessed/performed Overall Cognitive Status: Within Functional Limits for tasks assessed                                     General Comments       Exercises     Shoulder Instructions  Home Living Family/patient expects to be discharged to:: Private residence Living Arrangements: Alone Available Help at Discharge: Family;Available PRN/intermittently                   Bathroom Toilet: Standard     Home Equipment: Bedside commode   Additional Comments: pt sponge bathes      Prior Functioning/Environment Level of Independence: Independent;Independent with assistive device(s)                 OT Problem List: Decreased strength;Decreased activity tolerance;Decreased knowledge of use of DME or AE;Pain      OT Treatment/Interventions: Self-care/ADL training;DME and/or AE instruction;Patient/family education;Therapeutic activities    OT Goals(Current goals can be found in the care plan section) Acute Rehab OT  Goals Patient Stated Goal: get better OT Goal Formulation: With patient Time For Goal Achievement: 06/12/16 Potential to Achieve Goals: Good ADL Goals Pt Will Perform Grooming: with min guard assist;standing Pt Will Transfer to Toilet: with min assist;ambulating;bedside commode Pt Will Perform Toileting - Clothing Manipulation and hygiene: with min assist;sit to/from stand Additional ADL Goal #1: pt will perform bed mobility (sidelying <> at min A level in preparation for adls Additional ADL Goal #2: pt will go from sit to stand with min A for adls  OT Frequency: Min 2X/week   Barriers to D/C:            Co-evaluation              AM-PAC PT "6 Clicks" Daily Activity     Outcome Measure Help from another person eating meals?: None Help from another person taking care of personal grooming?: A Little Help from another person toileting, which includes using toliet, bedpan, or urinal?: A Lot Help from another person bathing (including washing, rinsing, drying)?: A Little Help from another person to put on and taking off regular upper body clothing?: A Little Help from another person to put on and taking off regular lower body clothing?: Total 6 Click Score: 16   End of Session    Activity Tolerance: Patient limited by pain Patient left: in bed;with call bell/phone within reach;with bed alarm set;with family/visitor present  OT Visit Diagnosis: Muscle weakness (generalized) (M62.81)                Time: 5726-2035 OT Time Calculation (min): 23 min Charges:  OT General Charges $OT Visit: 1 Procedure OT Evaluation $OT Eval Low Complexity: 1 Procedure G-Codes:     Iraan, OTR/L [phone] 06/05/2016  Tyjae Issa 06/05/2016, 1:16 PM

## 2016-06-05 NOTE — Progress Notes (Signed)
  Echocardiogram 2D Echocardiogram has been performed.  [name] [name] 06/05/2016, 10:41 AM

## 2016-06-06 DIAGNOSIS — R935 Abnormal findings on diagnostic imaging of other abdominal regions, including retroperitoneum: Secondary | ICD-10-CM

## 2016-06-06 LAB — HEPATIC FUNCTION PANEL
ALK PHOS: 320 U/L — AB (ref 38–126)
ALT: 88 U/L — AB (ref 14–54)
AST: 26 U/L (ref 15–41)
Albumin: 1.9 g/dL — ABNORMAL LOW (ref 3.5–5.0)
BILIRUBIN DIRECT: 0.4 mg/dL (ref 0.1–0.5)
BILIRUBIN INDIRECT: 0.4 mg/dL (ref 0.3–0.9)
TOTAL PROTEIN: 4.8 g/dL — AB (ref 6.5–8.1)
Total Bilirubin: 0.8 mg/dL (ref 0.3–1.2)

## 2016-06-06 LAB — CBC WITH DIFFERENTIAL/PLATELET
Basophils Absolute: 0 10*3/uL (ref 0.0–0.1)
Basophils Relative: 0 %
Eosinophils Absolute: 0.2 10*3/uL (ref 0.0–0.7)
Eosinophils Relative: 1 %
HEMATOCRIT: 27.7 % — AB (ref 36.0–46.0)
Hemoglobin: 9.6 g/dL — ABNORMAL LOW (ref 12.0–15.0)
LYMPHS ABS: 1.5 10*3/uL (ref 0.7–4.0)
LYMPHS PCT: 10 %
MCH: 32.4 pg (ref 26.0–34.0)
MCHC: 34.7 g/dL (ref 30.0–36.0)
MCV: 93.6 fL (ref 78.0–100.0)
Monocytes Absolute: 1.5 10*3/uL — ABNORMAL HIGH (ref 0.1–1.0)
Monocytes Relative: 10 %
NEUTROS ABS: 11.3 10*3/uL — AB (ref 1.7–7.7)
NEUTROS PCT: 79 %
Platelets: 154 10*3/uL (ref 150–400)
RBC: 2.96 MIL/uL — AB (ref 3.87–5.11)
RDW: 14.1 % (ref 11.5–15.5)
WBC: 14.3 10*3/uL — AB (ref 4.0–10.5)

## 2016-06-06 NOTE — Progress Notes (Signed)
PROGRESS NOTE    [name] [name]  ELF:[id number] DOB: December 29, 1917 DOA: 05/31/2016 PCP: Crist Infante, MD    Brief Narrative:  83 yo female presents with the chief complain of nausea and vomiting. Patient know to have atrial fibrillation and remote history of gastric cancer sp resection (Bilroth II). Symptoms present for 4 days before admission. Had cholecystectomy in the past. On the initial examination, patient was hemodynamically stable, in pian with a nontender abdomen. AST -ALT were elevated at 580-200 with Alkaline phos 558 and bilirubins at 2.4. CT with positive intrahepatic biliary duct dilatation. Admitted with sepsis from biliary origin. Blood culture positive for E. Coli. Patient not candidate for ERCP, consulted IR and percutaneous biliary drain placed on 06/04/2016. Antibiotic therapy changed to Unasyn.    Assessment & Plan:   Principal Problem:   Biliary sepsis Active Problems:   Atrial fibrillation (HCC)   Tachy-brady syndrome (HCC)   Essential hypertension   Sinus node dysfunction - s/p St. Jude (serial number [id number]) PPM   Nausea & vomiting   Biliary obstruction   Normocytic anemia   E coli bacteremia   Fecal impaction ([place])   Pacemaker   1. Biliary obstruction with gram negative bacteremia (E coli) and sepsis (present on admission). Percutaneous drain with output 800 of bilious fluid. Patient feeling better, with no abdominal pain, nausea or vomiting, tolerating po well. White cell count down to 14. Continue on Unasyn #2. Had one dose of ciprofloxacin and 2 doses of ceftriaxone, total 4 days of antibiotic therapy. Will plan to discharged on Augmentin to complete a 10 day course.    2. Atrial fibrillation . Rate control with diltiazem 120 mg and metoprolol 50 mg bid. Anticoagulation with apixaban.    3. AKI. Resolved, patient tolerating po well, IV fluids have been discontinued.   4. HTN. Blood pressure systolic 242 to 353, continue  AV blockade.   5. Anemia.  Stable, likely multifactorial, will need outpatient follow up.    DVT prophylaxis:heparin  Code Status:full  Family Communication: Disposition Plan:home   Consultants:  Gastroenterology   I.D  Procedures:    Antimicrobials:  Unasyn  #2   Subjective: Patient out of bed, tolerating po well, no nausea or vomiting, no fever or chills. No nausea or vomiting.   Objective: Vitals:   06/05/16 0458 06/05/16 1500 06/05/16 2056 06/06/16 0453  BP: 132/84 130/86 (!) 142/64 118/82  Pulse: 83 (!) 102 83 100  Resp: 20 20 18 16   Temp: 98.9 F (37.2 C) 99.1 F (37.3 C) 98.9 F (37.2 C) 99 F (37.2 C)  TempSrc: Oral Oral Oral Oral  SpO2: 95% 95% 98% 97%  Weight:      Height:        Intake/Output Summary (Last 24 hours) at 06/06/16 1104 Last data filed at 06/06/16 0406  Gross per 24 hour  Intake              130 ml  Output              800 ml  Net             -670 ml   Filed Weights   05/31/16 1306  Weight: 53.5 kg (118 lb)    Examination:  General exam: not in pain or dyspnea E ENT: mild pallor, no icterus, oral mucosa moist.  Respiratory system: Clear to auscultation. Respiratory effort normal. No wheezing, rales or rhonchi.  Cardiovascular system: S1 & S2 heard, RRR. No JVD, murmurs, rubs, gallops  or clicks. No pedal edema. Gastrointestinal system: Abdomen is nondistended, soft and nontender. No organomegaly or masses felt. Normal bowel sounds heard. Percutaneous drain in place.  Central nervous system: Alert and oriented. No focal neurological deficits. Extremities: Symmetric 5 x 5 power. Skin: No rashes, lesions or ulcers.    Data Reviewed: I have personally reviewed following labs and imaging studies  CBC:  Recent Labs Lab 06/01/16 0530 06/02/16 0352 06/03/16 0918 06/04/16 0352 06/05/16 0432 06/06/16 0415  WBC 13.0* 28.5* 34.3* 23.4* 20.3* 14.3*  NEUTROABS 10.6*  --   --  21.8* 17.5* 11.3*  HGB 8.6* 9.1* 9.8* 13.0 9.8* 9.6*  HCT 24.9*  26.9* 28.2* 39.8 27.6* 27.7*  MCV 98.0 98.5 95.3 95.2 92.0 93.6  PLT 136* 94* 123* 96* 144* 542   Basic Metabolic Panel:  Recent Labs Lab 06/01/16 0530 06/02/16 0352 06/03/16 0346 06/04/16 0352 06/05/16 0432  NA 137 136 136 135 137  K 3.7 3.9 3.6 3.7 3.8  CL 110 110 111 110 109  CO2 22 18* 19* 16* 22  GLUCOSE 86 87 98 105* 103*  BUN 18 25* 33* 27* 21*  CREATININE 0.82 1.41* 1.35* 1.07* 0.89  CALCIUM 7.8* 7.3* 7.2* 7.4* 7.3*  MG  --   --   --   --  1.7   GFR: Estimated Creatinine Clearance: 31.2 mL/min (by C-G formula based on SCr of 0.89 mg/dL). Liver Function Tests:  Recent Labs Lab 06/02/16 0352 06/03/16 0346 06/04/16 0352 06/05/16 0432 06/06/16 0415  AST 142* 135* 78* 40 26  ALT 298* 245* 204* 130* 88*  ALKPHOS 450* 433* 471* 397* 320*  BILITOT 2.6* 1.3* 1.1 0.9 0.8  PROT 5.0* 4.8* 5.2* 5.1* 4.8*  ALBUMIN 2.3* 2.1* 2.3* 2.1* 1.9*    Recent Labs Lab 05/31/16 1523  LIPASE <10*   No results for input(s): AMMONIA in the last 168 hours. Coagulation Profile:  Recent Labs Lab 06/04/16 0352  INR 1.12   Cardiac Enzymes: No results for input(s): CKTOTAL, CKMB, CKMBINDEX, TROPONINI in the last 168 hours. BNP (last 3 results) No results for input(s): PROBNP in the last 8760 hours. HbA1C: No results for input(s): HGBA1C in the last 72 hours. CBG: No results for input(s): GLUCAP in the last 168 hours. Lipid Profile: No results for input(s): CHOL, HDL, LDLCALC, TRIG, CHOLHDL, LDLDIRECT in the last 72 hours. Thyroid Function Tests: No results for input(s): TSH, T4TOTAL, FREET4, T3FREE, THYROIDAB in the last 72 hours. Anemia Panel: No results for input(s): VITAMINB12, FOLATE, FERRITIN, TIBC, IRON, RETICCTPCT in the last 72 hours. Sepsis Labs: No results for input(s): PROCALCITON, LATICACIDVEN in the last 168 hours.  Recent Results (from the past 240 hour(s))  Culture, blood (routine x 2)     Status: Abnormal   Collection Time: 06/01/16  5:30 PM  Result  Value Ref Range Status   Specimen Description BLOOD LEFT HAND  Final   Special Requests IN PEDIATRIC BOTTLE Blood Culture adequate volume  Final   Culture  Setup Time   Final    GRAM NEGATIVE RODS IN PEDIATRIC BOTTLE CRITICAL VALUE NOTED.  VALUE IS CONSISTENT WITH PREVIOUSLY REPORTED AND CALLED VALUE.    Culture (A)  Final    ESCHERICHIA COLI SUSCEPTIBILITIES PERFORMED ON PREVIOUS CULTURE WITHIN THE LAST 5 DAYS. Performed at [facility], [place] [address] [place], [place] [address]    Report Status 06/04/2016 FINAL  Final  Culture, blood (routine x 2)     Status: Abnormal   Collection Time: 06/01/16  5:39 PM  Result Value Ref Range Status   Specimen Description BLOOD RIGHT HAND  Final   Special Requests IN PEDIATRIC BOTTLE Blood Culture adequate volume  Final   Culture  Setup Time   Final    GRAM NEGATIVE RODS AEROBIC BOTTLE ONLY CRITICAL RESULT CALLED TO, READ BACK BY AND VERIFIED WITH: M. Lilliston Pharm.D. 17:50 06/02/16 ([email or web address]) Performed at [facility], [place] [address], [place], [place] [address]    Culture ESCHERICHIA COLI (A)  Final   Report Status 06/04/2016 FINAL  Final   Organism ID, Bacteria ESCHERICHIA COLI  Final      Susceptibility   Escherichia coli - MIC*    AMPICILLIN 4 SENSITIVE Sensitive     CEFAZOLIN <=4 SENSITIVE Sensitive     CEFEPIME <=1 SENSITIVE Sensitive     CEFTAZIDIME <=1 SENSITIVE Sensitive     CEFTRIAXONE <=1 SENSITIVE Sensitive     CIPROFLOXACIN <=0.25 SENSITIVE Sensitive     GENTAMICIN <=1 SENSITIVE Sensitive     IMIPENEM <=0.25 SENSITIVE Sensitive     TRIMETH/SULFA <=20 SENSITIVE Sensitive     AMPICILLIN/SULBACTAM <=2 SENSITIVE Sensitive     PIP/TAZO <=4 SENSITIVE Sensitive     Extended ESBL NEGATIVE Sensitive     * ESCHERICHIA COLI  Blood Culture ID Panel (Reflexed)     Status: Abnormal   Collection Time: 06/01/16  5:39 PM  Result Value Ref Range Status   Enterococcus species NOT DETECTED NOT DETECTED Final   Listeria  monocytogenes NOT DETECTED NOT DETECTED Final   Staphylococcus species NOT DETECTED NOT DETECTED Final   Staphylococcus aureus NOT DETECTED NOT DETECTED Final   Streptococcus species NOT DETECTED NOT DETECTED Final   Streptococcus agalactiae NOT DETECTED NOT DETECTED Final   Streptococcus pneumoniae NOT DETECTED NOT DETECTED Final   Streptococcus pyogenes NOT DETECTED NOT DETECTED Final   Acinetobacter baumannii NOT DETECTED NOT DETECTED Final   Enterobacteriaceae species DETECTED (A) NOT DETECTED Final    Comment: Enterobacteriaceae represent a large family of gram-negative bacteria, not a single organism. CRITICAL RESULT CALLED TO, READ BACK BY AND VERIFIED WITH: M. Lilliston Pharm.D. 17:50 06/02/17 ([email or web address])    Enterobacter cloacae complex NOT DETECTED NOT DETECTED Final   Escherichia coli DETECTED (A) NOT DETECTED Final    Comment: CRITICAL RESULT CALLED TO, READ BACK BY AND VERIFIED WITH: M. Lilliston Pharm.D. 17:50 06/02/16 ([email or web address])    Klebsiella oxytoca NOT DETECTED NOT DETECTED Final   Klebsiella pneumoniae NOT DETECTED NOT DETECTED Final   Proteus species NOT DETECTED NOT DETECTED Final   Serratia marcescens NOT DETECTED NOT DETECTED Final   Carbapenem resistance NOT DETECTED NOT DETECTED Final   Haemophilus influenzae NOT DETECTED NOT DETECTED Final   Neisseria meningitidis NOT DETECTED NOT DETECTED Final   Pseudomonas aeruginosa NOT DETECTED NOT DETECTED Final   Candida albicans NOT DETECTED NOT DETECTED Final   Candida glabrata NOT DETECTED NOT DETECTED Final   Candida krusei NOT DETECTED NOT DETECTED Final   Candida parapsilosis NOT DETECTED NOT DETECTED Final   Candida tropicalis NOT DETECTED NOT DETECTED Final    Comment: Performed at [facility], [place] [address], [place], [place] [address]  Culture, blood (routine x 2)     Status: None (Preliminary result)   Collection Time: 06/03/16 10:10 AM  Result Value Ref Range Status   Specimen Description BLOOD LEFT HAND   Final   Special Requests IN PEDIATRIC BOTTLE Blood Culture adequate volume  Final   Culture   Final  NO GROWTH 3 DAYS Performed at [facility], [place] [address], [place], [place] [address]    Report Status PENDING  Incomplete  Culture, blood (routine x 2)     Status: None (Preliminary result)   Collection Time: 06/03/16 10:11 AM  Result Value Ref Range Status   Specimen Description BLOOD RIGHT HAND  Final   Special Requests IN PEDIATRIC BOTTLE Blood Culture adequate volume  Final   Culture   Final    NO GROWTH 3 DAYS Performed at [facility], [place] 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 Recurrent left biliary dilatation. EXAM: ULTRASOUND AND FLUOROSCOPIC LEFT INTERNAL EXTERNAL BILIARY DRAIN INSERTION MEDICATIONS: Patient is already receiving Rocephin IV as an inpatient. The antibiotic was administered within an appropriate time frame prior to the initiation of the procedure. ANESTHESIA/SEDATION: Moderate (conscious) sedation was employed during this procedure. A total of Versed 1.0 mg and Fentanyl 50 mcg was administered intravenously. Moderate Sedation Time: 23 minutes minutes. The patient's level of consciousness and vital signs were monitored continuously by radiology nursing throughout the procedure under my direct supervision. FLUOROSCOPY TIME:  Fluoroscopy Time: 4 minutes 12 seconds (46 mGy). COMPLICATIONS: None immediate. PROCEDURE: Informed written consent was obtained from the patient after a thorough discussion of the procedural risks, benefits and alternatives. All questions were addressed. Maximal Sterile Barrier Technique was utilized including caps, mask, sterile gowns, sterile gloves, sterile drape, hand hygiene and skin antiseptic. A  timeout was performed prior to the initiation of the procedure. Previous imaging reviewed. Under sterile conditions and local anesthesia, ultrasound percutaneous needle access performed of a dilated left hepatic duct peripherally. There was return of bile. Contrast injection performed for a limited cholangiogram. Cholangiogram: Left biliary dilatation noted. Filling defects within the left hepatic duct compatible with choledocholithiasis. There is a central left hepatic duct stricture close to the biliary confluence. 018 guidewire advanced followed by the Accustick dilator set. This allowed insertion of a 5 [place] Kumpe catheter. Catheter and Bentson guidewire utilized to manipulate the access into the duodenum. Contrast injection confirms position in the duodenum. Over an Amplatz guidewire, tract dilatation performed to insert a 10 [place] internal external biliary drain. Catheter position confirmed with fluoroscopy and contrast. Images obtained for documentation. Biliary drain secured with a Prolene suture. Catheter connected to external gravity drainage. Sterile dressing applied. No immediate complication. IMPRESSION: Successful ultrasound and fluoroscopic 10 French left internal external biliary drain insertion. Electronically Signed   By: Jerilynn Mages.  Shick M.D.   On: 06/04/2016 14:00        Scheduled Meds: . apixaban  2.5 mg Oral BID  . calcium-vitamin D  1 tablet Oral Q breakfast  . Cyanocobalamin  500 mcg Each Nare Once per day on Sat  . diltiazem  120 mg Oral Daily  . fluticasone  2 spray Each Nare Daily  . loratadine  10 mg Oral Daily  . LORazepam  1 mg Oral QHS  . magnesium oxide  400 mg Oral TID  . metoprolol  50 mg Oral BID  . multivitamin with minerals  1 tablet Oral Daily  . omega-3 acid ethyl esters  1 g Oral Daily  . polyethylene glycol  17 g Oral BID  . senna-docusate  1 tablet Oral BID  Continuous Infusions: . ampicillin-sulbactam (UNASYN) IV 1.5 g (06/06/16 0407)  .  sodium  bicarbonate (isotonic) infusion in sterile water 75 mL/hr at 06/05/16 2230     LOS: 6 days        Tawni Millers, MD [facility] Pager [phone]  If 7PM-7AM, please contact night-coverage [email or web address] Password [email or web address] 06/06/2016, 11:04 AM

## 2016-06-06 NOTE — Progress Notes (Signed)
     [facility] Progress Note  Chief Complaint:  Biliary obstruction   Subjective: Up in chair. No abdominal pain. Only complaint is LUE pain and swelling.   Objective:  Vital signs in last 24 hours: Temp:  [98.9 F (37.2 C)-99.1 F (37.3 C)] 99 F (37.2 C) (05/13 0453) Pulse Rate:  [83-102] 100 (05/13 0453) Resp:  [16-20] 16 (05/13 0453) BP: (118-142)/(64-86) 118/82 (05/13 0453) SpO2:  [95 %-98 %] 97 % (05/13 0453) Last BM Date: 06/03/16 General:   Alert, well-developed, white female up in chair in NAD EENT:  Normal hearing, non icteric sclera, conjunctive pink.  Heart:  Regular rate, regularly irreg rhythm; no lower extremity edema Pulm: Normal respiratory effort. Abdomen:  Soft, nondistended, nontender.  Normal bowel sounds. Biliary drain with bilious drainage and scant amount of solid fluffy debris in bag.  Neurologic:  Alert and  oriented x4;  grossly normal neurologically. Psych:  Alert and cooperative. Normal mood and affect.   Intake/Output from previous day: 05/12 0701 - 05/13 0700 In: 130 [P.O.:120] Out: 800 [Drains:800] Intake/Output this shift: No intake/output data recorded.  Lab Results:  Recent Labs  06/04/16 0352 06/05/16 0432 06/06/16 0415  WBC 23.4* 20.3* 14.3*  HGB 13.0 9.8* 9.6*  HCT 39.8 27.6* 27.7*  PLT 96* 144* 154   BMET  Recent Labs  06/04/16 0352 06/05/16 0432  NA 135 137  K 3.7 3.8  CL 110 109  CO2 16* 22  GLUCOSE 105* 103*  BUN 27* 21*  CREATININE 1.07* 0.89  CALCIUM 7.4* 7.3*   LFT  Recent Labs  06/06/16 0415  PROT 4.8*  ALBUMIN 1.9*  AST 26  ALT 88*  ALKPHOS 320*  BILITOT 0.8  BILIDIR 0.4  IBILI 0.4   PT/INR  Recent Labs  06/04/16 0352  LABPROT 14.5  INR 1.12   Hepatitis Panel No results for input(s): HEPBSAG, HCVAB, HEPAIGM, HEPBIGM in the last 72 hours.   Assessment / Plan:  73. 83 yo female with bilroth II anatomy presenting with intrahepatic biliary obstruction / E. coli  bacteremia. She is s/p percutaneous drain placement to left system by IR. LFTs improving. WBC improving.  -ID has made recommendations regarding IV antibiotics which she will need for several days following discharge. -Drain management per ID  2. Hypoalbuminemia, albumin < 1 now. Her appetite has been suboptimal. Nutritional supplements should be offered if not already.   Principal Problem:   Biliary sepsis Active Problems:   Atrial fibrillation (HCC)   Tachy-brady syndrome ([place])   Essential hypertension   Sinus node dysfunction - s/p St. Jude (serial number [id number]) PPM   Nausea & vomiting   Biliary obstruction   Normocytic anemia   E coli bacteremia   Fecal impaction ([place])   Pacemaker   LOS: 6 days   Tye Savoy NP 06/06/2016, 9:55 AM  Pager number [phone]  GI ATTENDING  Interval history data reviewed. Patient personally seen and examined. Agree with interval progress note. Multiple family members in room. Patient doing well. No complaints. No discomfort from percutaneous tube. All blood work continues to improve. Anticipated discharge tomorrow. Outpatient follow-up with interventional radiology and Dr. Joylene Draft. Will sign off. Thank you.  Docia Chuck. Geri Seminole., M.D. [facility] Division of Gastroenterology

## 2016-06-06 NOTE — Progress Notes (Signed)
Referring Physician(s): Dr Rikki Spearing Dr Cline Cools  Supervising Physician: Jacqulynn Cadet  Patient Status:  [facility] - In-pt  Chief Complaint:  Biliary obstruction  Subjective:  Biliary drain placed in IR 5/11 draining well Pt feels some better; less sore today Pleasant and up in bed  Allergies: Patient has no known allergies.  Medications: Prior to Admission medications   Medication Sig Start Date End Date Taking? Authorizing Provider  acetaminophen (TYLENOL) 500 MG tablet Take 500 mg by mouth every 6 (six) hours as needed for mild pain.   Yes [provider]  atorvastatin (LIPITOR) 20 MG tablet TAKE 1 TABLET (20 MG TOTAL) BY MOUTH AT BEDTIME. 05/12/16  Yes Evans Lance, MD  calcium-vitamin D (OSCAL WITH D) 500-200 MG-UNIT per tablet Take 1 tablet by mouth daily with breakfast.   Yes [provider]  Collagenase (SANTYL EX) Apply 1 application topically daily.   Yes [provider]  Cyanocobalamin (NASCOBAL NA) Place 1 spray into both nostrils once a week. saturday   Yes [provider]  denosumab (PROLIA) 60 MG/ML SOLN injection Inject 60 mg into the skin every 6 (six) months. Administer in upper arm, thigh, or abdomen   Yes [provider]  diltiazem (CARDIZEM CD) 120 MG 24 hr capsule TAKE 1 CAPSULE (120 MG TOTAL) BY MOUTH DAILY. 06/04/15  Yes Barrett, Rhonda G, PA-C  ELIQUIS 2.5 MG TABS tablet TAKE 1 TABLET BY MOUTH TWICE A DAY 11/25/15  Yes Barrett, Rhonda G, PA-C  ferrous sulfate (SLOW FE) 160 (50 FE) MG TBCR SR tablet Take 160 mg by mouth daily.   Yes [provider]  hydrochlorothiazide (HYDRODIURIL) 25 MG tablet Take 12.5 mg by mouth daily.   Yes [provider]  KLOR-CON 10 10 MEQ tablet Take 10 mEq by mouth daily. 02/17/15  Yes [provider]  LORazepam (ATIVAN) 1 MG tablet Take 1 mg by mouth at bedtime.   Yes [provider]  metoprolol (LOPRESSOR) 50 MG tablet Take 50 mg by mouth 2  (two) times daily.   Yes [provider]  mometasone (NASONEX) 50 MCG/ACT nasal spray Place 2 sprays into both nostrils daily.   Yes [provider]  Multiple Vitamins-Minerals (MULTIVITAMIN WITH MINERALS) tablet Take 1 tablet by mouth daily.   Yes [provider]  NASCOBAL 500 MCG/0.1ML SOLN Place 500 mcg into both nostrils as directed. 04/14/15  Yes [provider]  Omega-3 Fatty Acids (FISH OIL) 1000 MG CPDR Take 1,000 mg by mouth daily.   Yes [provider]  Polyethyl Glycol-Propyl Glycol 0.4-0.3 % SOLN Apply 1 drop to eye daily as needed (dryness).   Yes [provider]  potassium chloride (K-DUR,KLOR-CON) 10 MEQ tablet Take 10 mEq by mouth daily.   Yes [provider]  sucralfate (CARAFATE) 1 G tablet Take 1 g by mouth 4 (four) times daily -  before meals and at bedtime.   Yes [provider]  traMADol (ULTRAM) 50 MG tablet Take 50-100 mg by mouth 3 (three) times daily as needed (pain).   Yes [provider]  Vitamin D, Ergocalciferol, (DRISDOL) 50000 UNITS CAPS capsule Take 1 capsule by mouth once a week. 08/05/14  Yes [provider]     Vital Signs: BP 118/82 (BP Location: Left Arm)   Pulse 100   Temp 99 F (37.2 C) (Oral)   Resp 16   Ht 5\' 5"  (1.651 m)   Wt 118 lb (53.5 kg)   SpO2  97%   BMI 19.64 kg/m   Physical Exam  Constitutional: She is oriented to person, place, and time.  Abdominal: Soft. Bowel sounds are normal.  Musculoskeletal: Normal range of motion.  Neurological: She is alert and oriented to person, place, and time.  Skin: Skin is warm and dry.  Skin site is clean and dry No bleeding NT No infection  800 cc output yesterday 200 cc in bag: bile Afeb  Nursing note and vitals reviewed.  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 Recurrent left biliary dilatation. EXAM: ULTRASOUND AND FLUOROSCOPIC LEFT INTERNAL EXTERNAL BILIARY DRAIN INSERTION MEDICATIONS: Patient is already receiving Rocephin IV as an inpatient. The antibiotic was administered within an appropriate time frame prior to the initiation of the procedure. ANESTHESIA/SEDATION: Moderate (conscious) sedation was employed during this procedure. A total of Versed 1.0 mg and Fentanyl 50 mcg was administered intravenously. Moderate Sedation Time: 23 minutes minutes. The patient's level of consciousness and vital signs were monitored continuously by radiology nursing throughout the procedure under my direct supervision. FLUOROSCOPY TIME:  Fluoroscopy Time: 4 minutes 12 seconds (46 mGy). COMPLICATIONS: None immediate. PROCEDURE: Informed written consent was obtained from the patient after a thorough discussion of the procedural risks, benefits and alternatives. All questions were addressed. Maximal Sterile Barrier Technique was utilized including caps, mask, sterile gowns, sterile gloves, sterile drape, hand hygiene and skin antiseptic. A timeout was performed prior to the initiation of the procedure. Previous imaging reviewed. Under sterile conditions and local anesthesia, ultrasound percutaneous needle access performed of a dilated left hepatic duct peripherally. There was return of bile. Contrast injection performed for a limited cholangiogram. Cholangiogram: Left biliary dilatation noted. Filling defects within the left hepatic duct compatible with choledocholithiasis. There is a central left hepatic duct stricture close to the biliary confluence. 018 guidewire advanced followed by the Accustick dilator set. This allowed insertion of a 5 [place] Kumpe catheter. Catheter and Bentson guidewire utilized to manipulate the access into the duodenum. Contrast injection confirms position in the duodenum. Over an Amplatz guidewire, tract dilatation performed to insert a 10  [place] internal external biliary drain. Catheter position confirmed with fluoroscopy and contrast. Images obtained for documentation. Biliary drain secured with a Prolene suture. Catheter connected to external gravity drainage. Sterile dressing applied. No immediate complication. IMPRESSION: Successful ultrasound and fluoroscopic 10 French left internal external biliary drain insertion. Electronically Signed   By: Jerilynn Mages.  Shick M.D.   On: 06/04/2016 14:00    Labs:  CBC:  Recent Labs  06/03/16 0918 06/04/16 0352 06/05/16 0432 06/06/16 0415  WBC 34.3* 23.4* 20.3* 14.3*  HGB 9.8* 13.0 9.8* 9.6*  HCT 28.2* 39.8 27.6* 27.7*  PLT 123* 96* 144* 154    COAGS:  Recent Labs  05/31/16 2150 06/01/16 0530 06/01/16 1735 06/02/16 0352 06/04/16 0352  INR  --   --   --   --  1.12  APTT >200* >200* 136* 77*  --     BMP:  Recent Labs  06/02/16 0352 06/03/16 0346 06/04/16 0352 06/05/16 0432  NA 136 136 135 137  K 3.9 3.6 3.7 3.8  CL 110 111 110 109  CO2 18* 19* 16* 22  GLUCOSE 87 98 105* 103*  BUN 25* 33* 27* 21*  CALCIUM 7.3* 7.2* 7.4* 7.3*  CREATININE 1.41* 1.35* 1.07* 0.89  GFRNONAA 30* 32* 42* 53*  GFRAA 35*  37* 49* >60    LIVER FUNCTION TESTS:  Recent Labs  06/03/16 0346 06/04/16 0352 06/05/16 0432 06/06/16 0415  BILITOT 1.3* 1.1 0.9 0.8  AST 135* 78* 40 26  ALT 245* 204* 130* 88*  ALKPHOS 433* 471* 397* 320*  PROT 4.8* 5.2* 5.1* 4.8*  ALBUMIN 2.1* 2.3* 2.1* 1.9*    Assessment and Plan:  Biliary drain placed 5/11 in IR Will follow Plan per GI/THR  Electronically Signed: P[place]ou[place] Schreiner A 06/06/2016, 9:26 AM   I spent a total of 15 Minutes at the the patient's bedside AND on the patient's hospital floor or unit, greater than 50% of which was counseling/coordinating care for biliary drain

## 2016-06-07 ENCOUNTER — Inpatient Hospital Stay (HOSPITAL_COMMUNITY): Payer: Medicare Other

## 2016-06-07 ENCOUNTER — Other Ambulatory Visit: Payer: Self-pay | Admitting: Physician Assistant

## 2016-06-07 DIAGNOSIS — R609 Edema, unspecified: Secondary | ICD-10-CM

## 2016-06-07 LAB — BASIC METABOLIC PANEL
Anion gap: 10 (ref 5–15)
BUN: 17 mg/dL (ref 6–20)
CHLORIDE: 100 mmol/L — AB (ref 101–111)
CO2: 28 mmol/L (ref 22–32)
CREATININE: 0.85 mg/dL (ref 0.44–1.00)
Calcium: 7.4 mg/dL — ABNORMAL LOW (ref 8.9–10.3)
GFR calc Af Amer: >60 mL/min (ref >60)
GFR calc non Af Amer: 56 mL/min — ABNORMAL LOW (ref >60)
Glucose, Bld: 109 mg/dL — ABNORMAL HIGH (ref 65–99)
Potassium: 3.8 mmol/L (ref 3.5–5.1)
Sodium: 138 mmol/L (ref 135–145)

## 2016-06-07 LAB — CBC WITH DIFFERENTIAL/PLATELET
Basophils Absolute: 0 10*3/uL (ref 0.0–0.1)
Basophils Relative: 0 %
EOS PCT: 2 %
Eosinophils Absolute: 0.3 10*3/uL (ref 0.0–0.7)
HCT: 27.2 % — ABNORMAL LOW (ref 36.0–46.0)
Hemoglobin: 9.4 g/dL — ABNORMAL LOW (ref 12.0–15.0)
LYMPHS PCT: 12 %
Lymphs Abs: 1.5 10*3/uL (ref 0.7–4.0)
MCH: 32.5 pg (ref 26.0–34.0)
MCHC: 34.6 g/dL (ref 30.0–36.0)
MCV: 94.1 fL (ref 78.0–100.0)
MONO ABS: 1.2 10*3/uL — AB (ref 0.1–1.0)
Monocytes Relative: 9 %
Neutro Abs: 9.9 10*3/uL — ABNORMAL HIGH (ref 1.7–7.7)
Neutrophils Relative %: 77 %
PLATELETS: 181 10*3/uL (ref 150–400)
RBC: 2.89 MIL/uL — ABNORMAL LOW (ref 3.87–5.11)
RDW: 14.1 % (ref 11.5–15.5)
WBC: 12.9 10*3/uL — ABNORMAL HIGH (ref 4.0–10.5)

## 2016-06-07 MED ORDER — AMOXICILLIN-POT CLAVULANATE 875-125 MG PO TABS
1.0000 | ORAL_TABLET | Freq: Two times a day (BID) | ORAL | Status: DC
  Administered 2016-06-07: 1 via ORAL
  Filled 2016-06-07: qty 1

## 2016-06-07 MED ORDER — AMOXICILLIN-POT CLAVULANATE 875-125 MG PO TABS: 1.0000 | ORAL_TABLET | Freq: Two times a day (BID) | ORAL | 0 refills | Status: AC

## 2016-06-07 NOTE — Progress Notes (Signed)
Called to speak with daughter [name] [name] as patient was discharged prior to discharge instructions being reviewed.  Called and emphasized to daughter that patient needed to take Augmentin twice a day and that it was called in to [facility].  Patient follow up appointments discussed as well as all medications.  Patient's daughter [name] [name] verbalized understanding without further questions.  Informed Peggy that we would mail discharge papers to the home.  Peggy thanked me for the call and verbalized understanding.

## 2016-06-07 NOTE — Progress Notes (Signed)
*  PRELIMINARY RESULTS* Vascular Ultrasound Left upper extremity venous duplex has been completed.  Preliminary findings: The visualized veins of the left upper extremity are negative for deep vein thrombosis.  There is a very small segment of superficial vein thrombosis in the cephalic vein at the antecubital fossa, site of prior IV.  Preliminary results given to nurse @ 12:30  Everrett Coombe 06/07/2016, 12:30 PM

## 2016-06-07 NOTE — Progress Notes (Signed)
Pharmacy Antibiotic Note  [name] [name] is a 83 y.o. female admitted on 05/31/2016 with intrahepatic biliary obstruction who was found to have E.Coli bacteremia.  Pharmacy was consulted for Unasyn dosing.  Now starting D#7 abx and D#4 Unasyn.  Patient has a pacemaker; ID service has recommended two weeks of IV antibiotics due to concern over potential for bacterial seeding of this device.  Follow-up blood cultures drawn 5/10 are negative to date.  Plan: Continue Unasyn 1.5 grams IV q8h. Await further guidance from ID service.  Height: 5\' 5"  (165.1 cm) Weight: 118 lb (53.5 kg) IBW/kg (Calculated) : 57  Temp (24hrs), Avg:98.3 F (36.8 C), Min:98.1 F (36.7 C), Max:98.6 F (37 C)   Recent Labs Lab 06/02/16 0352 06/03/16 0346 06/03/16 0918 06/04/16 0352 06/05/16 0432 06/06/16 0415 06/07/16 0348  WBC 28.5*  --  34.3* 23.4* 20.3* 14.3* 12.9*  CREATININE 1.41* 1.35*  --  1.07* 0.89  --  0.85    Estimated Creatinine Clearance: 32.7 mL/min (by C-G formula based on SCr of 0.85 mg/dL).    No Known Allergies   Antimicrobials this admission: 5/8 Cipro >> 5/9 5/9 Metronidazole >> 5/11 5/10 Rocephin >>5/11 5/11 Unasyn>>  Microbiology results: 5/8 BCx: 2/2 E coli, pan-susceptible 5/7 Hep panel: neg 5/10 rpt BCx: ngtd    Thank you for allowing pharmacy to be a part of this patient's care.  Clayburn Pert, PharmD, BCPS Pager: [phone] 06/07/2016  9:17 AM

## 2016-06-07 NOTE — Progress Notes (Signed)
This CM spoke with pt and daughter again at bedside for discharge planning. When this CM met with them last week they declined home health services. Pt now has biliary drain that will need to be flushed at home. Choice offered for home health RN and [facility] was chosen. [facility] rep contacted for referral and order received. Marney Doctor RN,BSN,NCM [phone]

## 2016-06-07 NOTE — Progress Notes (Signed)
Occupational Therapy Treatment Patient Details Name: [name] [name] MRN: [id number] DOB: [birth year] Today's Date: 06/07/2016    History of present illness  [name] [name] is a 83 y.o. female with medical history significant for cancer of the stomach status post remote resection, tachybradycardia syndrome status post pacer placement, hypertension, and chronic atrial fibrillation on Eliquis who presents to the emergency department for evaluation of nausea and vomiting-->hepatic biliary obstruction.  s/p biliary drain 06/04/16   OT comments  Plan is for pt to DC this day  Follow Up Recommendations  Supervision/Assistance - 24 hour;Home health OT              Mobility Bed Mobility Overal bed mobility: Needs Assistance Bed Mobility: Supine to Sit Rolling: Min assist            Transfers Overall transfer level: Needs assistance Equipment used: Rolling walker (2 wheeled) Transfers: Sit to/from [facility] Sit to Stand: Min assist Stand pivot transfers: Min assist            Balance Overall balance assessment: Needs assistance           Standing balance-Leahy Scale: Poor Standing balance comment: reliant on UEs in standing                           ADL either performed or assessed with clinical judgement   ADL Overall ADL's : Needs assistance/impaired                 Upper Body Dressing : Set up;Sitting   Lower Body Dressing: Moderate assistance;Sit to/from stand;Cueing for safety;Cueing for sequencing   Toilet Transfer: Minimal assistance;RW;BSC;Cueing for sequencing;Cueing for safety   Toileting- Clothing Manipulation and Hygiene: Minimal assistance;Sit to/from stand;Cueing for safety;Cueing for sequencing         General ADL Comments: pt agreed to get dressed. Pt did have urinary incontinence sitting EOB while donning clothes.  Pt wears pad at home .  Family will provided 24/7 A at home as pt will need for A with ADL activity       Vision Patient Visual Report: No change from baseline     Perception     Praxis      Cognition Arousal/Alertness: Awake/alert Behavior During Therapy: WFL for tasks assessed/performed Overall Cognitive Status: Within Functional Limits for tasks assessed                                          Exercises     Shoulder Instructions       General Comments      Pertinent Vitals/ Pain       Faces Pain Scale: Hurts little more Pain Location: abdomen Pain Descriptors / Indicators: Sore Pain Intervention(s): Monitored during session;Repositioned     Prior Functioning/Environment              Frequency  Min 2X/week        Progress Toward Goals  OT Goals(current goals can now be found in the care plan section)  Progress towards OT goals: Progressing toward goals     Plan      Co-evaluation                 AM-PAC PT "6 Clicks" Daily Activity     Outcome Measure   Help from another person eating meals?: None Help  from another person taking care of personal grooming?: A Little Help from another person toileting, which includes using toliet, bedpan, or urinal?: A Little Help from another person bathing (including washing, rinsing, drying)?: A Little Help from another person to put on and taking off regular upper body clothing?: A Little Help from another person to put on and taking off regular lower body clothing?: A Lot 6 Click Score: 18    End of Session    OT Visit Diagnosis: Muscle weakness (generalized) (M62.81);Unsteadiness on feet (R26.81)   Activity Tolerance Patient tolerated treatment well   Patient Left Other (comment);with nursing/sitter in room;with family/visitor present (sitting EOB)   Nurse Communication Mobility status        Time: 3329-5188 OT Time Calculation (min): 23 min  Charges: OT General Charges $OT Visit: 1 Procedure OT Treatments $Self Care/Home Management : 23-37 mins  [place],  [place]   Payton Mccallum D 06/07/2016, 2:06 PM

## 2016-06-07 NOTE — Discharge Summary (Addendum)
Physician Discharge Summary  RONA TOMSON ZPH:[id number] DOB: 14-Apr-1917 DOA: 05/31/2016  PCP: Crist Infante, MD  Admit date: 05/31/2016 Discharge date: 06/07/2016  Admitted From: Home  Disposition:  Home   Recommendations for Outpatient Follow-up:  1. Follow up with PCP in 1- week. 2. Please flush biliary drain with 5 ml of sterile saline daily 3. Will continue antibiotic therapy with Augmentin for 7 more days, to stop on 5/21.   Home Health: Yes Equipment/Devices: Biliary drain in place  Discharge Condition: Stable  CODE STATUS: Full  Diet recommendation: Heart Healthy   Brief/Interim Summary: 83 yo female presents with the chief complain of nausea and vomiting. Patient know to have atrial fibrillation and remote history of gastric cancer sp resection (Bilroth II). Symptoms present for 4 days before admission. Had cholecystectomy in the past. On the initial examination, patient was hemodynamically stable, but in pian with a nontender abdomen. AST -ALT were elevated at 580-200 with Alkaline phos 558 and bilirubins at 2.4. Sodium 139, potassium 3.3, chloride 106, bicarbonate 25, glucose 99, BUN 22, creatinine 0.96, white count 9.0, hemoglobin 9.8, hematocrit 29.2, platelets 152. CT with a diffuse extensive left intrahepatic biliary dilatation, central left hepatic duct nodular ill-defined soft tissue attenuation may represent upper biliary tree choledocholithiasis versus underlying intrabiliary abnormality. Urinalysis negative for infection  The patient was admitted with working diagnosis of sepsis from biliary origin.   1. Biliary obstruction, complicated by sepsis due to gram-negative bacteremia/Escherichia coli (present on admission). Patient was admitted to the medical floor with a remote telemetry monitor, she was placed on broad spermatic antibiotics including ciprofloxacin and metronidazole. Intravenous fluids. Patient was seen by gastroenterology, patient unable to get MRCP due to  pacemaker, deemed not to be candidate for ERCP. With cell count peaked at 34,3. Patient underwent a percutaneous biliary drain with good response, output over last 24 hour 250 mL of bilious fluid. Blood cultures were positive for Escherichia coli, antibiotics were adjusted to sensitivities, patient responded well to Unasyn, successfully transitioned to oral Augmentin, will complete therapy on May 21. Infection disease was consulted. Discharge white cell count 12.9. Echocardiogram with no intracavitary vegetations.   2. Chronic atrial fibrillation. Rate remained well controlled with diltiazem and metoprolol. Anticoagulation with apixaban, clinically euvolemic.  3. Acute kidney injury. Serum creatinine peaked at 1.41, prerenal in nature, responded to intravenous fluids, discharge creatinine 0.85.   4. Hypertension. Blood pressure remained well-controlled, patient tolerated well IV fluids, metoprolol and diltiazem was continued. Hydrochlorothiazide will be resumed by the time discharge  5. Multifactorial anemia. Hemoglobin and hematocrit remained stable, no need for blood transfusion. Resume  6. Dyslipidemia. Continue atorvastatin.  Late entry: There does appear to be a very small segment of superficial vein thrombosis in the cephalic vein at the antecubital fossa, site of prior IV.  Patient and her daughter informed and all questions were addressed, follow as outpatient.   Discharge Diagnoses:  Principal Problem:   Biliary sepsis Active Problems:   Atrial fibrillation (HCC)   Tachy-brady syndrome (HCC)   Essential hypertension   Sinus node dysfunction - s/p St. Jude (serial number [id number]) PPM   Nausea & vomiting   Biliary obstruction   Normocytic anemia   E coli bacteremia   Fecal impaction (HCC)   Pacemaker   Abnormal CT of the abdomen    Discharge Instructions   Allergies as of 06/07/2016   No Known Allergies     Medication List    STOP taking these medications   potassium  chloride 10 MEQ tablet Commonly known as:  K-DUR,KLOR-CON     TAKE these medications   acetaminophen 500 MG tablet Commonly known as:  TYLENOL Take 500 mg by mouth every 6 (six) hours as needed for mild pain.   amoxicillin-clavulanate 875-125 MG tablet Commonly known as:  AUGMENTIN Take 1 tablet by mouth 2 (two) times daily.   atorvastatin 20 MG tablet Commonly known as:  LIPITOR TAKE 1 TABLET (20 MG TOTAL) BY MOUTH AT BEDTIME.   calcium-vitamin D 500-200 MG-UNIT tablet Commonly known as:  OSCAL WITH D Take 1 tablet by mouth daily with breakfast.   denosumab 60 MG/ML Soln injection Commonly known as:  PROLIA Inject 60 mg into the skin every 6 (six) months. Administer in upper arm, thigh, or abdomen   diltiazem 120 MG 24 hr capsule Commonly known as:  CARDIZEM CD TAKE 1 CAPSULE (120 MG TOTAL) BY MOUTH DAILY.   ELIQUIS 2.5 MG Tabs tablet Generic drug:  apixaban TAKE 1 TABLET BY MOUTH TWICE A DAY   ferrous sulfate 160 (50 Fe) MG Tbcr SR tablet Commonly known as:  SLOW FE Take 160 mg by mouth daily.   Fish Oil 1000 MG Cpdr Take 1,000 mg by mouth daily.   hydrochlorothiazide 25 MG tablet Commonly known as:  HYDRODIURIL Take 12.5 mg by mouth daily.   KLOR-CON 10 10 MEQ tablet Generic drug:  potassium chloride Take 10 mEq by mouth daily.   LORazepam 1 MG tablet Commonly known as:  ATIVAN Take 1 mg by mouth at bedtime.   metoprolol 50 MG tablet Commonly known as:  LOPRESSOR Take 50 mg by mouth 2 (two) times daily.   mometasone 50 MCG/ACT nasal spray Commonly known as:  NASONEX Place 2 sprays into both nostrils daily.   multivitamin with minerals tablet Take 1 tablet by mouth daily.   NASCOBAL NA Place 1 spray into both nostrils once a week. saturday   NASCOBAL 500 MCG/0.1ML Soln Generic drug:  Cyanocobalamin Place 500 mcg into both nostrils as directed.   Polyethyl Glycol-Propyl Glycol 0.4-0.3 % Soln Apply 1 drop to eye daily as needed (dryness).    SANTYL EX Apply 1 application topically daily.   sucralfate 1 g tablet Commonly known as:  CARAFATE Take 1 g by mouth 4 (four) times daily -  before meals and at bedtime.   traMADol 50 MG tablet Commonly known as:  ULTRAM Take 50-100 mg by mouth 3 (three) times daily as needed (pain).   Vitamin D (Ergocalciferol) 50000 units Caps capsule Commonly known as:  DRISDOL Take 1 capsule by mouth once a week.      Follow-up Information    [facility] Follow up.   Specialty:  Home Health Services Contact information: [facility] [place] [address] [phone]          No Known Allergies  Consultations:  Gastroenterology  Interventional radiology  Infection disease   Procedures/Studies: Ct Abdomen Pelvis W Contrast  Result Date: 05/31/2016 CLINICAL DATA:  Acute abdominal pain, vomiting, diarrhea, elevated LFTs, prior cholecystectomy EXAM: CT ABDOMEN AND PELVIS WITH CONTRAST TECHNIQUE: Multidetector CT imaging of the abdomen and pelvis was performed using the standard protocol following bolus administration of intravenous contrast. CONTRAST:  80 cc Isovue-300 COMPARISON:  None available FINDINGS: Lower chest: Bibasilar scarring/ atelectasis. Cardiomegaly evident. No pericardial or pleural effusion. Pacer wires noted in the right heart. Extensive atherosclerosis of the descending thoracic aorta. Hepatobiliary: Remote cholecystectomy. Intrahepatic biliary dilatation in the left hepatic  lobe extends to the biliary confluence where there is rounded soft tissue/nodular attenuation, images 17 through 19. Differential considerations include upper biliary tree choledocholithiasis resulting in the ductal obstruction. Difficult to exclude biliary stricture or neoplastic process such as cholangiocarcinoma at this level. Minor right biliary prominence. Common bile duct is dilated but extends all the way to the ampulla and may be chronically dilated related to the  cholecystectomy. Pancreas is diffusely thin atrophic without appreciable mass or focal abnormality. Pancreas: Diffuse atrophy. No ductal dilatation. No surrounding inflammatory process or fluid collection. Spleen: Normal in size without focal abnormality. Adrenals/Urinary Tract: Normal adrenal glands for age. Kidneys demonstrate normal symmetric enhancement. No obstructing ureteral calculus or hydroureter. Urinary bladder grossly unremarkable. Nonobstructing punctate left nephrolithiasis measures 5 mm, image 25. Stomach/Bowel: Negative for bowel obstruction, significant dilatation, ileus, or free air. Colonic diverticulosis noted. Large volume of stool in the rectum, suggest fecal impaction. No fluid collection or abscess. Vascular/Lymphatic: Extensive atherosclerosis of the aorta and abdominal vasculature. Negative for aneurysm. No occlusive process. Iliac vessels are heavily calcified and tortuous but remain patent. No adenopathy. Reproductive: Remote hysterectomy. No adnexal mass. No pelvic free fluid or fluid collection. Other: No inguinal abnormality.  Intact abdominal wall. Musculoskeletal: Bones are osteopenic. Advanced degenerative spondylosis of the spine. Chronic changes of the lumbar spine with deformity at at L4-5. No acute osseous finding. IMPRESSION: Diffuse extensive left intrahepatic biliary dilatation extends to the biliary confluence. Central left hepatic duct nodular ill-defined soft tissue attenuation may represent upper biliary tree choledocholithiasis versus underlying intra biliary abnormality or mass such as cholangiocarcinoma. Mild biliary prominence of the right hepatic ducts and mild dilatation of the common bile duct may be secondary to a remote cholecystectomy. Consider further evaluation with GI consultation. Extensive abdominal and pelvic atherosclerosis Nonobstructing left nephrolithiasis Colonic diverticulosis Electronically Signed   By: Jerilynn Mages.  Shick M.D.   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 Recurrent left biliary dilatation. EXAM: ULTRASOUND AND FLUOROSCOPIC LEFT INTERNAL EXTERNAL BILIARY DRAIN INSERTION MEDICATIONS: Patient is already receiving Rocephin IV as an inpatient. The antibiotic was administered within an appropriate time frame prior to the initiation of the procedure. ANESTHESIA/SEDATION: Moderate (conscious) sedation was employed during this procedure. A total of Versed 1.0 mg and Fentanyl 50 mcg was administered intravenously. Moderate Sedation Time: 23 minutes minutes. The patient's level of consciousness and vital signs were monitored continuously by radiology nursing throughout the procedure under my direct supervision. FLUOROSCOPY TIME:  Fluoroscopy Time: 4 minutes 12 seconds (46 mGy). COMPLICATIONS: None immediate. PROCEDURE: Informed written consent was obtained from the patient after a thorough discussion of the procedural risks, benefits and alternatives. All questions were addressed. Maximal Sterile Barrier Technique was utilized including caps, mask, sterile gowns, sterile gloves, sterile drape, hand hygiene and skin antiseptic. A timeout was performed prior to the initiation of the procedure. Previous imaging reviewed. Under sterile conditions and local anesthesia, ultrasound percutaneous needle access performed of a dilated left hepatic duct peripherally. There was return of bile. Contrast injection performed for a limited cholangiogram. Cholangiogram: Left biliary dilatation noted. Filling defects within the left hepatic duct compatible with choledocholithiasis. There is a central left hepatic duct stricture close to the biliary confluence. 018 guidewire advanced followed by the Accustick dilator set. This allowed insertion of a 5 [place] Kumpe catheter. Catheter and Bentson guidewire utilized to manipulate the access  into the duodenum.  Contrast injection confirms position in the duodenum. Over an Amplatz guidewire, tract dilatation performed to insert a 10 [place] internal external biliary drain. Catheter position confirmed with fluoroscopy and contrast. Images obtained for documentation. Biliary drain secured with a Prolene suture. Catheter connected to external gravity drainage. Sterile dressing applied. No immediate complication. IMPRESSION: Successful ultrasound and fluoroscopic 10 French left internal external biliary drain insertion. Electronically Signed   By: Jerilynn Mages.  Shick M.D.   On: 06/04/2016 14:00       Subjective: Patient feeling better, no nausea, no vomiting, no abdominal pain, tolerating by mouth diet adequately. Discharge Exam: Vitals:   06/06/16 2055 06/07/16 0415  BP: 132/65 (!) 144/63  Pulse: 87 76  Resp: 18 18  Temp: 98.6 F (37 C) 98.2 F (36.8 C)   Vitals:   06/06/16 0453 06/06/16 1421 06/06/16 2055 06/07/16 0415  BP: 118/82 108/74 132/65 (!) 144/63  Pulse: 100 83 87 76  Resp: 16 16 18 18   Temp: 99 F (37.2 C) 98.1 F (36.7 C) 98.6 F (37 C) 98.2 F (36.8 C)  TempSrc: Oral Oral Oral Oral  SpO2: 97% 98% 99% 98%  Weight:      Height:        General: Pt is alert, awake, not in acute distress Neck no JVD Cardiovascular: RRR, S1/S2 +, no rubs, no gallops Respiratory: CTA bilaterally, no wheezing, no rhonchi Abdominal: Soft, NT, ND, bowel sounds +. Biliary drain in place.  Extremities: no edema, no cyanosis    The results of significant diagnostics from this hospitalization (including imaging, microbiology, ancillary and laboratory) are listed below for reference.     Microbiology: Recent Results (from the past 240 hour(s))  Culture, blood (routine x 2)     Status: Abnormal   Collection Time: 06/01/16  5:30 PM  Result Value Ref Range Status   Specimen Description BLOOD LEFT HAND  Final   Special Requests IN PEDIATRIC BOTTLE Blood Culture adequate volume  Final    Culture  Setup Time   Final    GRAM NEGATIVE RODS IN PEDIATRIC BOTTLE CRITICAL VALUE NOTED.  VALUE IS CONSISTENT WITH PREVIOUSLY REPORTED AND CALLED VALUE.    Culture (A)  Final    ESCHERICHIA COLI SUSCEPTIBILITIES PERFORMED ON PREVIOUS CULTURE WITHIN THE LAST 5 DAYS. Performed at [facility], [place] [address], [place], [place] [address]    Report Status 06/04/2016 FINAL  Final  Culture, blood (routine x 2)     Status: Abnormal   Collection Time: 06/01/16  5:39 PM  Result Value Ref Range Status   Specimen Description BLOOD RIGHT HAND  Final   Special Requests IN PEDIATRIC BOTTLE Blood Culture adequate volume  Final   Culture  Setup Time   Final    GRAM NEGATIVE RODS AEROBIC BOTTLE ONLY CRITICAL RESULT CALLED TO, READ BACK BY AND VERIFIED WITH: M. Lilliston Pharm.D. 17:50 06/02/16 ([email or web address]) Performed at [facility], [place] [address] [place], [place] [address]    Culture ESCHERICHIA COLI (A)  Final   Report Status 06/04/2016 FINAL  Final   Organism ID, Bacteria ESCHERICHIA COLI  Final      Susceptibility   Escherichia coli - MIC*    AMPICILLIN 4 SENSITIVE Sensitive     CEFAZOLIN <=4 SENSITIVE Sensitive     CEFEPIME <=1 SENSITIVE Sensitive     CEFTAZIDIME <=1 SENSITIVE Sensitive     CEFTRIAXONE <=1 SENSITIVE Sensitive     CIPROFLOXACIN <=0.25 SENSITIVE Sensitive     GENTAMICIN <=1 SENSITIVE  Sensitive     IMIPENEM <=0.25 SENSITIVE Sensitive     TRIMETH/SULFA <=20 SENSITIVE Sensitive     AMPICILLIN/SULBACTAM <=2 SENSITIVE Sensitive     PIP/TAZO <=4 SENSITIVE Sensitive     Extended ESBL NEGATIVE Sensitive     * ESCHERICHIA COLI  Blood Culture ID Panel (Reflexed)     Status: Abnormal   Collection Time: 06/01/16  5:39 PM  Result Value Ref Range Status   Enterococcus species NOT DETECTED NOT DETECTED Final   Listeria monocytogenes NOT DETECTED NOT DETECTED Final   Staphylococcus species NOT DETECTED NOT DETECTED Final   Staphylococcus aureus NOT DETECTED NOT DETECTED  Final   Streptococcus species NOT DETECTED NOT DETECTED Final   Streptococcus agalactiae NOT DETECTED NOT DETECTED Final   Streptococcus pneumoniae NOT DETECTED NOT DETECTED Final   Streptococcus pyogenes NOT DETECTED NOT DETECTED Final   Acinetobacter baumannii NOT DETECTED NOT DETECTED Final   Enterobacteriaceae species DETECTED (A) NOT DETECTED Final    Comment: Enterobacteriaceae represent a large family of gram-negative bacteria, not a single organism. CRITICAL RESULT CALLED TO, READ BACK BY AND VERIFIED WITH: M. Lilliston Pharm.D. 17:50 06/02/17 ([email or web address])    Enterobacter cloacae complex NOT DETECTED NOT DETECTED Final   Escherichia coli DETECTED (A) NOT DETECTED Final    Comment: CRITICAL RESULT CALLED TO, READ BACK BY AND VERIFIED WITH: M. Lilliston Pharm.D. 17:50 06/02/16 ([email or web address])    Klebsiella oxytoca NOT DETECTED NOT DETECTED Final   Klebsiella pneumoniae NOT DETECTED NOT DETECTED Final   Proteus species NOT DETECTED NOT DETECTED Final   Serratia marcescens NOT DETECTED NOT DETECTED Final   Carbapenem resistance NOT DETECTED NOT DETECTED Final   Haemophilus influenzae NOT DETECTED NOT DETECTED Final   Neisseria meningitidis NOT DETECTED NOT DETECTED Final   Pseudomonas aeruginosa NOT DETECTED NOT DETECTED Final   Candida albicans NOT DETECTED NOT DETECTED Final   Candida glabrata NOT DETECTED NOT DETECTED Final   Candida krusei NOT DETECTED NOT DETECTED Final   Candida parapsilosis NOT DETECTED NOT DETECTED Final   Candida tropicalis NOT DETECTED NOT DETECTED Final    Comment: Performed at [facility], [place] [address], [place], [place] [address]  Culture, blood (routine x 2)     Status: None (Preliminary result)   Collection Time: 06/03/16 10:10 AM  Result Value Ref Range Status   Specimen Description BLOOD LEFT HAND  Final   Special Requests IN PEDIATRIC BOTTLE Blood Culture adequate volume  Final   Culture   Final    NO GROWTH 3 DAYS Performed at [facility], [place] [address], [place], [place] [address]    Report Status PENDING  Incomplete  Culture, blood (routine x 2)     Status: None (Preliminary result)   Collection Time: 06/03/16 10:11 AM  Result Value Ref Range Status   Specimen Description BLOOD RIGHT HAND  Final   Special Requests IN PEDIATRIC BOTTLE Blood Culture adequate volume  Final   Culture   Final    NO GROWTH 3 DAYS Performed at [facility], [place] [address], [place], [place] [address]    Report Status PENDING  Incomplete     Labs: BNP (last 3 results) No results for input(s): BNP in the last 8760 hours. Basic Metabolic Panel:  Recent Labs Lab 06/02/16 0352 06/03/16 0346 06/04/16 0352 06/05/16 0432 06/07/16 0348  NA 136 136 135 137 138  K 3.9 3.6 3.7 3.8 3.8  CL 110 111 110 109 100*  CO2 18* 19*  16* 22 28  GLUCOSE 87 98 105* 103* 109*  BUN 25* 33* 27* 21* 17  CREATININE 1.41* 1.35* 1.07* 0.89 0.85  CALCIUM 7.3* 7.2* 7.4* 7.3* 7.4*  MG  --   --   --  1.7  --    Liver Function Tests:  Recent Labs Lab 06/02/16 0352 06/03/16 0346 06/04/16 0352 06/05/16 0432 06/06/16 0415  AST 142* 135* 78* 40 26  ALT 298* 245* 204* 130* 88*  ALKPHOS 450* 433* 471* 397* 320*  BILITOT 2.6* 1.3* 1.1 0.9 0.8  PROT 5.0* 4.8* 5.2* 5.1* 4.8*  ALBUMIN 2.3* 2.1* 2.3* 2.1* 1.9*    Recent Labs Lab 05/31/16 1523  LIPASE <10*   No results for input(s): AMMONIA in the last 168 hours. CBC:  Recent Labs Lab 06/01/16 0530  06/03/16 0918 06/04/16 0352 06/05/16 0432 06/06/16 0415 06/07/16 0348  WBC 13.0*  < > 34.3* 23.4* 20.3* 14.3* 12.9*  NEUTROABS 10.6*  --   --  21.8* 17.5* 11.3* 9.9*  HGB 8.6*  < > 9.8* 13.0 9.8* 9.6* 9.4*  HCT 24.9*  < > 28.2* 39.8 27.6* 27.7* 27.2*  MCV 98.0  < > 95.3 95.2 92.0 93.6 94.1  PLT 136*  < > 123* 96* 144* 154 181  < > = values in this interval not displayed. Cardiac Enzymes: No results for input(s): CKTOTAL, CKMB, CKMBINDEX, TROPONINI in the last 168 hours. BNP: Invalid  input(s): POCBNP CBG: No results for input(s): GLUCAP in the last 168 hours. D-Dimer No results for input(s): DDIMER in the last 72 hours. Hgb A1c No results for input(s): HGBA1C in the last 72 hours. Lipid Profile No results for input(s): CHOL, HDL, LDLCALC, TRIG, CHOLHDL, LDLDIRECT in the last 72 hours. Thyroid function studies No results for input(s): TSH, T4TOTAL, T3FREE, THYROIDAB in the last 72 hours.  Invalid input(s): FREET3 Anemia work up No results for input(s): VITAMINB12, FOLATE, FERRITIN, TIBC, IRON, RETICCTPCT in the last 72 hours. Urinalysis    Component Value Date/Time   COLORURINE AMBER (A) 06/04/2016 0254   APPEARANCEUR HAZY (A) 06/04/2016 0254   LABSPEC 1.019 06/04/2016 0254   PHURINE 5.0 06/04/2016 0254   GLUCOSEU NEGATIVE 06/04/2016 0254   HGBUR NEGATIVE 06/04/2016 0254   BILIRUBINUR NEGATIVE 06/04/2016 0254   KETONESUR NEGATIVE 06/04/2016 0254   PROTEINUR NEGATIVE 06/04/2016 0254   NITRITE NEGATIVE 06/04/2016 0254   LEUKOCYTESUR TRACE (A) 06/04/2016 0254   Sepsis Labs Invalid input(s): PROCALCITONIN,  WBC,  LACTICIDVEN Microbiology Recent Results (from the past 240 hour(s))  Culture, blood (routine x 2)     Status: Abnormal   Collection Time: 06/01/16  5:30 PM  Result Value Ref Range Status   Specimen Description BLOOD LEFT HAND  Final   Special Requests IN PEDIATRIC BOTTLE Blood Culture adequate volume  Final   Culture  Setup Time   Final    GRAM NEGATIVE RODS IN PEDIATRIC BOTTLE CRITICAL VALUE NOTED.  VALUE IS CONSISTENT WITH PREVIOUSLY REPORTED AND CALLED VALUE.    Culture (A)  Final    ESCHERICHIA COLI SUSCEPTIBILITIES PERFORMED ON PREVIOUS CULTURE WITHIN THE LAST 5 DAYS. Performed at [facility], [place] [address] [place], [place] [address]    Report Status 06/04/2016 FINAL  Final  Culture, blood (routine x 2)     Status: Abnormal   Collection Time: 06/01/16  5:39 PM  Result Value Ref Range Status   Specimen Description BLOOD RIGHT  HAND  Final   Special Requests IN PEDIATRIC BOTTLE Blood Culture adequate volume  Final   Culture  Setup Time   Final    GRAM NEGATIVE RODS AEROBIC BOTTLE ONLY CRITICAL RESULT CALLED TO, READ BACK BY AND VERIFIED WITH: M. Lilliston Pharm.D. 17:50 06/02/16 ([email or web address]) Performed at [facility], [place] [address] [place], [place] [address]    Culture ESCHERICHIA COLI (A)  Final   Report Status 06/04/2016 FINAL  Final   Organism ID, Bacteria ESCHERICHIA COLI  Final      Susceptibility   Escherichia coli - MIC*    AMPICILLIN 4 SENSITIVE Sensitive     CEFAZOLIN <=4 SENSITIVE Sensitive     CEFEPIME <=1 SENSITIVE Sensitive     CEFTAZIDIME <=1 SENSITIVE Sensitive     CEFTRIAXONE <=1 SENSITIVE Sensitive     CIPROFLOXACIN <=0.25 SENSITIVE Sensitive     GENTAMICIN <=1 SENSITIVE Sensitive     IMIPENEM <=0.25 SENSITIVE Sensitive     TRIMETH/SULFA <=20 SENSITIVE Sensitive     AMPICILLIN/SULBACTAM <=2 SENSITIVE Sensitive     PIP/TAZO <=4 SENSITIVE Sensitive     Extended ESBL NEGATIVE Sensitive     * ESCHERICHIA COLI  Blood Culture ID Panel (Reflexed)     Status: Abnormal   Collection Time: 06/01/16  5:39 PM  Result Value Ref Range Status   Enterococcus species NOT DETECTED NOT DETECTED Final   Listeria monocytogenes NOT DETECTED NOT DETECTED Final   Staphylococcus species NOT DETECTED NOT DETECTED Final   Staphylococcus aureus NOT DETECTED NOT DETECTED Final   Streptococcus species NOT DETECTED NOT DETECTED Final   Streptococcus agalactiae NOT DETECTED NOT DETECTED Final   Streptococcus pneumoniae NOT DETECTED NOT DETECTED Final   Streptococcus pyogenes NOT DETECTED NOT DETECTED Final   Acinetobacter baumannii NOT DETECTED NOT DETECTED Final   Enterobacteriaceae species DETECTED (A) NOT DETECTED Final    Comment: Enterobacteriaceae represent a large family of gram-negative bacteria, not a single organism. CRITICAL RESULT CALLED TO, READ BACK BY AND VERIFIED WITH: M. Lilliston Pharm.D. 17:50  06/02/17 ([email or web address])    Enterobacter cloacae complex NOT DETECTED NOT DETECTED Final   Escherichia coli DETECTED (A) NOT DETECTED Final    Comment: CRITICAL RESULT CALLED TO, READ BACK BY AND VERIFIED WITH: M. Lilliston Pharm.D. 17:50 06/02/16 ([email or web address])    Klebsiella oxytoca NOT DETECTED NOT DETECTED Final   Klebsiella pneumoniae NOT DETECTED NOT DETECTED Final   Proteus species NOT DETECTED NOT DETECTED Final   Serratia marcescens NOT DETECTED NOT DETECTED Final   Carbapenem resistance NOT DETECTED NOT DETECTED Final   Haemophilus influenzae NOT DETECTED NOT DETECTED Final   Neisseria meningitidis NOT DETECTED NOT DETECTED Final   Pseudomonas aeruginosa NOT DETECTED NOT DETECTED Final   Candida albicans NOT DETECTED NOT DETECTED Final   Candida glabrata NOT DETECTED NOT DETECTED Final   Candida krusei NOT DETECTED NOT DETECTED Final   Candida parapsilosis NOT DETECTED NOT DETECTED Final   Candida tropicalis NOT DETECTED NOT DETECTED Final    Comment: Performed at [facility], [place] [address], [place], [place] [address]  Culture, blood (routine x 2)     Status: None (Preliminary result)   Collection Time: 06/03/16 10:10 AM  Result Value Ref Range Status   Specimen Description BLOOD LEFT HAND  Final   Special Requests IN PEDIATRIC BOTTLE Blood Culture adequate volume  Final   Culture   Final    NO GROWTH 3 DAYS Performed at [facility], [place] [address], [place], [place] [address]    Report Status PENDING  Incomplete  Culture, blood (routine x  2)     Status: None (Preliminary result)   Collection Time: 06/03/16 10:11 AM  Result Value Ref Range Status   Specimen Description BLOOD RIGHT HAND  Final   Special Requests IN PEDIATRIC BOTTLE Blood Culture adequate volume  Final   Culture   Final    NO GROWTH 3 DAYS Performed at [facility], [place] [address], [place], [place] [address]    Report Status PENDING  Incomplete     Time coordinating discharge: 45  minutes  SIGNED:   Tawni Millers, MD  [facility] 06/07/2016, 10:39 AM Pager   If 7PM-7AM, please contact night-coverage [email or web address] Password [email or web address]

## 2016-06-07 NOTE — Progress Notes (Signed)
Referring Physician(s): Dr Rikki Spearing Dr Cline Cools  Supervising Physician: Dr. Kathlene Cote  Patient Status:  [facility] - In-pt  Chief Complaint:  Biliary obstruction  Subjective:  Biliary drain placed in IR 5/11 draining well Pt feels some better; less sore today Pleasant and up in bed, says she is going home today  Allergies: Patient has no known allergies.  Medications:  Current Facility-Administered Medications:  .  acetaminophen (TYLENOL) tablet 650 mg, 650 mg, Oral, Q6H PRN **OR** acetaminophen (TYLENOL) suppository 650 mg, 650 mg, Rectal, Q6H PRN, Opyd, Timothy S, MD .  ampicillin-sulbactam (UNASYN) 1.5 g in sodium chloride 0.9 % 50 mL IVPB, 1.5 g, Intravenous, Q8H, Eugenie Filler, MD, Stopped at 06/07/16 0409 .  apixaban (ELIQUIS) tablet 2.5 mg, 2.5 mg, Oral, BID, Eugenie Filler, MD, 2.5 mg at 06/06/16 2136 .  calcium-vitamin D (OSCAL WITH D) 500-200 MG-UNIT per tablet 1 tablet, 1 tablet, Oral, Q breakfast, Opyd, Ilene Qua, MD, 1 tablet at 06/06/16 1029 .  Cyanocobalamin SOLN 500 mcg, 500 mcg, Each Nare, Once per day on Sat, Opyd, Timothy S, MD .  diltiazem (CARDIZEM CD) 24 hr capsule 120 mg, 120 mg, Oral, Daily, Opyd, Ilene Qua, MD, 120 mg at 06/06/16 1029 .  fentaNYL (SUBLIMAZE) injection 25 mcg, 25 mcg, Intravenous, Q2H PRN, Opyd, Timothy S, MD .  fluticasone (FLONASE) 50 MCG/ACT nasal spray 2 spray, 2 spray, Each Nare, Daily, Opyd, Ilene Qua, MD, 2 spray at 06/06/16 1028 .  loratadine (CLARITIN) tablet 10 mg, 10 mg, Oral, Daily, Eugenie Filler, MD, 10 mg at 06/06/16 1030 .  LORazepam (ATIVAN) tablet 1 mg, 1 mg, Oral, QHS, Opyd, Ilene Qua, MD, 1 mg at 06/04/16 2257 .  magnesium oxide (MAG-OX) tablet 400 mg, 400 mg, Oral, TID, Arrien, Jimmy Picket, MD, 400 mg at 06/06/16 2135 .  metoprolol tartrate (LOPRESSOR) tablet 50 mg, 50 mg, Oral, BID, Opyd, Ilene Qua, MD, 50 mg at 06/06/16 2135 .  multivitamin with minerals tablet 1 tablet, 1 tablet, Oral, Daily, Opyd,  Ilene Qua, MD, 1 tablet at 06/06/16 1030 .  omega-3 acid ethyl esters (LOVAZA) capsule 1 g, 1 g, Oral, Daily, Opyd, Ilene Qua, MD, 1 g at 06/06/16 1029 .  ondansetron (ZOFRAN) injection 4 mg, 4 mg, Intravenous, Q6H PRN, Opyd, Ilene Qua, MD, 4 mg at 06/01/16 0646 .  polyethylene glycol (MIRALAX / GLYCOLAX) packet 17 g, 17 g, Oral, BID, Eugenie Filler, MD, 17 g at 06/06/16 1029 .  polyvinyl alcohol (LIQUIFILM TEARS) 1.4 % ophthalmic solution 1 drop, 1 drop, Both Eyes, PRN, Opyd, Timothy S, MD .  promethazine (PHENERGAN) injection 6.25 mg, 6.25 mg, Intravenous, Q6H PRN, Opyd, Ilene Qua, MD, 6.25 mg at 06/01/16 0907 .  senna-docusate (Senokot-S) tablet 1 tablet, 1 tablet, Oral, BID, Eugenie Filler, MD, 1 tablet at 06/06/16 1029 .  sodium bicarbonate 150 mEq in sterile water 1,000 mL infusion, , Intravenous, Continuous, Eugenie Filler, MD, Last Rate: 75 mL/hr at 06/06/16 1831 .  traMADol (ULTRAM) tablet 50-100 mg, 50-100 mg, Oral, TID PRN, Opyd, Ilene Qua, MD, 50 mg at 06/01/16 1608    Vital Signs: BP (!) 144/63 (BP Location: Right Arm)   Pulse 76   Temp 98.2 F (36.8 C) (Oral)   Resp 18   Ht 5\' 5"  (1.651 m)   Wt 118 lb (53.5 kg)   SpO2 98%   BMI 19.64 kg/m   Physical Exam  Constitutional: She is oriented to person, place, and time.  Abdominal:  Soft. Bowel sounds are normal.  Drain intact. Bilious output  Musculoskeletal: Normal range of motion.  Neurological: She is alert and oriented to person, place, and time.  Skin: Skin is warm and dry.  Skin site is clean and dry   Nursing note and vitals reviewed.     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 Recurrent left biliary dilatation. EXAM: ULTRASOUND AND FLUOROSCOPIC LEFT INTERNAL EXTERNAL BILIARY DRAIN INSERTION MEDICATIONS: Patient is already receiving Rocephin IV as an inpatient. The  antibiotic was administered within an appropriate time frame prior to the initiation of the procedure. ANESTHESIA/SEDATION: Moderate (conscious) sedation was employed during this procedure. A total of Versed 1.0 mg and Fentanyl 50 mcg was administered intravenously. Moderate Sedation Time: 23 minutes minutes. The patient's level of consciousness and vital signs were monitored continuously by radiology nursing throughout the procedure under my direct supervision. FLUOROSCOPY TIME:  Fluoroscopy Time: 4 minutes 12 seconds (46 mGy). COMPLICATIONS: None immediate. PROCEDURE: Informed written consent was obtained from the patient after a thorough discussion of the procedural risks, benefits and alternatives. All questions were addressed. Maximal Sterile Barrier Technique was utilized including caps, mask, sterile gowns, sterile gloves, sterile drape, hand hygiene and skin antiseptic. A timeout was performed prior to the initiation of the procedure. Previous imaging reviewed. Under sterile conditions and local anesthesia, ultrasound percutaneous needle access performed of a dilated left hepatic duct peripherally. There was return of bile. Contrast injection performed for a limited cholangiogram. Cholangiogram: Left biliary dilatation noted. Filling defects within the left hepatic duct compatible with choledocholithiasis. There is a central left hepatic duct stricture close to the biliary confluence. 018 guidewire advanced followed by the Accustick dilator set. This allowed insertion of a 5 [place] Kumpe catheter. Catheter and Bentson guidewire utilized to manipulate the access into the duodenum. Contrast injection confirms position in the duodenum. Over an Amplatz guidewire, tract dilatation performed to insert a 10 [place] internal external biliary drain. Catheter position confirmed with fluoroscopy and contrast. Images obtained for documentation. Biliary drain secured with a Prolene suture. Catheter connected to external  gravity drainage. Sterile dressing applied. No immediate complication. IMPRESSION: Successful ultrasound and fluoroscopic 10 French left internal external biliary drain insertion. Electronically Signed   By: Jerilynn Mages.  Shick M.D.   On: 06/04/2016 14:00    Labs:  CBC:  Recent Labs  06/04/16 0352 06/05/16 0432 06/06/16 0415 06/07/16 0348  WBC 23.4* 20.3* 14.3* 12.9*  HGB 13.0 9.8* 9.6* 9.4*  HCT 39.8 27.6* 27.7* 27.2*  PLT 96* 144* 154 181    COAGS:  Recent Labs  05/31/16 2150 06/01/16 0530 06/01/16 1735 06/02/16 0352 06/04/16 0352  INR  --   --   --   --  1.12  APTT >200* >200* 136* 77*  --     BMP:  Recent Labs  06/03/16 0346 06/04/16 0352 06/05/16 0432 06/07/16 0348  NA 136 135 137 138  K 3.6 3.7 3.8 3.8  CL 111 110 109 100*  CO2 19* 16* 22 28  GLUCOSE 98 105* 103* 109*  BUN 33* 27* 21* 17  CALCIUM 7.2* 7.4* 7.3* 7.4*  CREATININE 1.35* 1.07* 0.89 0.85  GFRNONAA 32* 42* 53* 56*  GFRAA 37* 49* >60 >60    LIVER FUNCTION TESTS:  Recent Labs  06/03/16 0346 06/04/16 0352 06/05/16 0432 06/06/16 0415  BILITOT 1.3* 1.1 0.9 0.8  AST 135* 78* 40 26  ALT  245* 204* 130* 88*  ALKPHOS 433* 471* 397* 320*  PROT 4.8* 5.2* 5.1* 4.8*  ALBUMIN 2.1* 2.3* 2.1* 1.9*    Assessment and Plan: Biliary drain placed 5/11 in IR Plan per GI/THR, for DC today  Electronically Signed: Ascencion Dike 06/07/2016, 9:14 AM   I spent a total of 15 Minutes at the the patient's bedside AND on the patient's hospital floor or unit, greater than 50% of which was counseling/coordinating care for biliary drain

## 2016-06-08 DIAGNOSIS — I495 Sick sinus syndrome: Secondary | ICD-10-CM | POA: Diagnosis not present

## 2016-06-08 DIAGNOSIS — Z950 Presence of cardiac pacemaker: Secondary | ICD-10-CM | POA: Diagnosis not present

## 2016-06-08 DIAGNOSIS — I4891 Unspecified atrial fibrillation: Secondary | ICD-10-CM | POA: Diagnosis not present

## 2016-06-08 DIAGNOSIS — Z85028 Personal history of other malignant neoplasm of stomach: Secondary | ICD-10-CM | POA: Diagnosis not present

## 2016-06-08 DIAGNOSIS — Z4803 Encounter for change or removal of drains: Secondary | ICD-10-CM | POA: Diagnosis not present

## 2016-06-08 DIAGNOSIS — D6489 Other specified anemias: Secondary | ICD-10-CM | POA: Diagnosis not present

## 2016-06-08 DIAGNOSIS — I10 Essential (primary) hypertension: Secondary | ICD-10-CM | POA: Diagnosis not present

## 2016-06-08 DIAGNOSIS — Z48815 Encounter for surgical aftercare following surgery on the digestive system: Secondary | ICD-10-CM | POA: Diagnosis not present

## 2016-06-08 DIAGNOSIS — K830 Cholangitis: Secondary | ICD-10-CM | POA: Diagnosis not present

## 2016-06-08 DIAGNOSIS — K831 Obstruction of bile duct: Secondary | ICD-10-CM | POA: Diagnosis not present

## 2016-06-08 LAB — CULTURE, BLOOD (ROUTINE X 2)
Culture: NO GROWTH
Culture: NO GROWTH
Special Requests: ADEQUATE
Special Requests: ADEQUATE

## 2016-06-10 DIAGNOSIS — Z48815 Encounter for surgical aftercare following surgery on the digestive system: Secondary | ICD-10-CM | POA: Diagnosis not present

## 2016-06-10 DIAGNOSIS — K831 Obstruction of bile duct: Secondary | ICD-10-CM | POA: Diagnosis not present

## 2016-06-14 ENCOUNTER — Ambulatory Visit (HOSPITAL_COMMUNITY)
Admission: RE | Admit: 2016-06-14 | Discharge: 2016-06-14 | Disposition: A | Discharge status: Home / Self Care | Payer: Medicare Other | Source: Ambulatory Visit | Attending: Internal Medicine | Admitting: Internal Medicine

## 2016-06-14 DIAGNOSIS — Z48815 Encounter for surgical aftercare following surgery on the digestive system: Secondary | ICD-10-CM | POA: Diagnosis not present

## 2016-06-14 DIAGNOSIS — K831 Obstruction of bile duct: Secondary | ICD-10-CM | POA: Diagnosis not present

## 2016-06-15 DIAGNOSIS — K831 Obstruction of bile duct: Secondary | ICD-10-CM | POA: Diagnosis not present

## 2016-06-15 DIAGNOSIS — R7881 Bacteremia: Secondary | ICD-10-CM | POA: Diagnosis not present

## 2016-06-15 DIAGNOSIS — Z950 Presence of cardiac pacemaker: Secondary | ICD-10-CM | POA: Diagnosis not present

## 2016-06-15 DIAGNOSIS — I4891 Unspecified atrial fibrillation: Secondary | ICD-10-CM | POA: Diagnosis not present

## 2016-06-15 DIAGNOSIS — L89159 Pressure ulcer of sacral region, unspecified stage: Secondary | ICD-10-CM | POA: Diagnosis not present

## 2016-06-15 DIAGNOSIS — E119 Type 2 diabetes mellitus without complications: Secondary | ICD-10-CM | POA: Diagnosis not present

## 2016-06-15 DIAGNOSIS — M810 Age-related osteoporosis without current pathological fracture: Secondary | ICD-10-CM | POA: Diagnosis not present

## 2016-06-15 DIAGNOSIS — Z6820 Body mass index (BMI) 20.0-20.9, adult: Secondary | ICD-10-CM | POA: Diagnosis not present

## 2016-06-16 ENCOUNTER — Encounter: Payer: Self-pay | Admitting: Internal Medicine

## 2016-06-16 DIAGNOSIS — Z48815 Encounter for surgical aftercare following surgery on the digestive system: Secondary | ICD-10-CM | POA: Diagnosis not present

## 2016-06-16 DIAGNOSIS — K831 Obstruction of bile duct: Secondary | ICD-10-CM | POA: Diagnosis not present

## 2016-06-18 DIAGNOSIS — Z48815 Encounter for surgical aftercare following surgery on the digestive system: Secondary | ICD-10-CM | POA: Diagnosis not present

## 2016-06-18 DIAGNOSIS — K831 Obstruction of bile duct: Secondary | ICD-10-CM | POA: Diagnosis not present

## 2016-06-21 ENCOUNTER — Encounter (HOSPITAL_COMMUNITY): Payer: Self-pay | Admitting: Emergency Medicine

## 2016-06-21 ENCOUNTER — Telehealth: Payer: Self-pay | Admitting: Gastroenterology

## 2016-06-21 ENCOUNTER — Emergency Department (HOSPITAL_COMMUNITY)
Admission: EM | Admit: 2016-06-21 | Discharge: 2016-06-21 | Disposition: A | Discharge status: Home / Self Care | Payer: Medicare Other | Attending: Emergency Medicine | Admitting: Emergency Medicine

## 2016-06-21 DIAGNOSIS — Y732 Prosthetic and other implants, materials and accessory gastroenterology and urology devices associated with adverse incidents: Secondary | ICD-10-CM | POA: Diagnosis not present

## 2016-06-21 DIAGNOSIS — T85520A Displacement of bile duct prosthesis, initial encounter: Secondary | ICD-10-CM | POA: Insufficient documentation

## 2016-06-21 DIAGNOSIS — Z85028 Personal history of other malignant neoplasm of stomach: Secondary | ICD-10-CM | POA: Insufficient documentation

## 2016-06-21 DIAGNOSIS — Z950 Presence of cardiac pacemaker: Secondary | ICD-10-CM | POA: Diagnosis not present

## 2016-06-21 DIAGNOSIS — I10 Essential (primary) hypertension: Secondary | ICD-10-CM | POA: Insufficient documentation

## 2016-06-21 NOTE — ED Provider Notes (Signed)
[facility] DEPT Provider Note   CSN: [id number] Arrival date & time: 06/21/16  1024     History   Chief Complaint Chief Complaint  Patient presents with  . Supply    HPI [name] [name] is a 83 y.o. female.  83 yo F with a chief complaint of a biliary drain breaking. The patient was using a lift chair at home didn't realize that the biliary drain was wrapped around it. She does not think it was pulled out of her abdomen. She denies abdominal tenderness, nausea or vomiting. The patient had a placed for a obstruction of her biliary drainage system. She had significant output of the tube until last Wednesday. No drainage since then.   The history is provided by the patient and a relative.  Illness  This is a new problem. The current episode started 3 to 5 hours ago. The problem occurs constantly. The problem has not changed since onset.Pertinent negatives include no chest pain, no headaches and no shortness of breath. Nothing aggravates the symptoms. Nothing relieves the symptoms. She has tried nothing for the symptoms. The treatment provided no relief.    Past Medical History:  Diagnosis Date  . Atrial fibrillation ([place])    a. on Eliquis  . Hypertension   . 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 Tachy-brady syndrome ([place])    a. s/p STJ dual chamber PPM implant    Patient Active Problem List   Diagnosis Date Noted  . Abnormal CT of the abdomen   . E coli bacteremia 06/04/2016  . Biliary sepsis 06/04/2016  . Fecal impaction ([place]) 06/04/2016  . Pacemaker   . Nausea & vomiting 05/31/2016  . Biliary obstruction 05/31/2016  . Normocytic anemia 05/31/2016  . Pneumothorax, left 05/17/2014  . Tachy-brady syndrome ([place]) 05/16/2014  . Bradycardia 05/16/2014  . Foot ulcer ([place]) 05/16/2014  . Essential hypertension 05/16/2014  . Atrial flutter ([place]) 05/16/2014  . Sinus node dysfunction - s/p St. Jude (serial number [id number]) PPM   . Atrial fibrillation ([place]) 05/15/2014    Past  Surgical History:  Procedure Laterality Date  . BACK SURGERY    . CHOLECYSTECTOMY    . IR INT EXT BILIARY DRAIN WITH CHOLANGIOGRAM  06/04/2016  . PARTIAL GASTRECTOMY  1992  . PERMANENT PACEMAKER INSERTION N/A 05/16/2014   STJ Assurity dual chamber pacemaker implanted by Dr Lovena Le  . TOTAL ABDOMINAL HYSTERECTOMY      OB History    No data available       Home Medications    Prior to Admission medications   Medication Sig Start Date End Date Taking? Authorizing Provider  acetaminophen (TYLENOL) 500 MG tablet Take 500 mg by mouth every 6 (six) hours as needed for mild pain.    [provider]  atorvastatin (LIPITOR) 20 MG tablet TAKE 1 TABLET (20 MG TOTAL) BY MOUTH AT BEDTIME. 05/12/16   Evans Lance, MD  calcium-vitamin D (OSCAL WITH D) 500-200 MG-UNIT per tablet Take 1 tablet by mouth daily with breakfast.    [provider]  Collagenase (SANTYL EX) Apply 1 application topically daily.    [provider]  Cyanocobalamin (NASCOBAL NA) Place 1 spray into both nostrils once a week. saturday    [provider]  denosumab (PROLIA) 60 MG/ML SOLN injection Inject 60 mg into the skin every 6 (six) months. Administer in upper arm, thigh, or abdomen    [provider]  diltiazem (CARDIZEM CD) 120 MG 24 hr capsule Take  1 capsule (120 mg total) by mouth daily. 06/08/16   Evans Lance, MD  ELIQUIS 2.5 MG TABS tablet TAKE 1 TABLET BY MOUTH TWICE A DAY 11/25/15   Barrett, Rhonda G, PA-C  ferrous sulfate (SLOW FE) 160 (50 FE) MG TBCR SR tablet Take 160 mg by mouth daily.    [provider]  hydrochlorothiazide (HYDRODIURIL) 25 MG tablet Take 12.5 mg by mouth daily.    [provider]  KLOR-CON 10 10 MEQ tablet Take 10 mEq by mouth daily. 02/17/15   [provider]  LORazepam (ATIVAN) 1 MG tablet Take 1 mg by mouth at bedtime.    [provider]  metoprolol (LOPRESSOR) 50 MG tablet Take 50 mg by mouth 2 (two) times  daily.    [provider]  mometasone (NASONEX) 50 MCG/ACT nasal spray Place 2 sprays into both nostrils daily.    [provider]  Multiple Vitamins-Minerals (MULTIVITAMIN WITH MINERALS) tablet Take 1 tablet by mouth daily.    [provider]  NASCOBAL 500 MCG/0.1ML SOLN Place 500 mcg into both nostrils as directed. 04/14/15   [provider]  Omega-3 Fatty Acids (FISH OIL) 1000 MG CPDR Take 1,000 mg by mouth daily.    [provider]  Polyethyl Glycol-Propyl Glycol 0.4-0.3 % SOLN Apply 1 drop to eye daily as needed (dryness).    [provider]  sucralfate (CARAFATE) 1 G tablet Take 1 g by mouth 4 (four) times daily -  before meals and at bedtime.    [provider]  traMADol (ULTRAM) 50 MG tablet Take 50-100 mg by mouth 3 (three) times daily as needed (pain).    [provider]  Vitamin D, Ergocalciferol, (DRISDOL) 50000 UNITS CAPS capsule Take 1 capsule by mouth once a week. 08/05/14   [provider]    Family History Family History  Problem Relation Age of Onset  . Heart disease Father        Dropsy  . Pneumonia Mother   . Sudden death Daughter 70  . Heart attack Son 84    Social History Social History  Substance Use Topics  . Smoking status: Never Smoker  . Smokeless tobacco: Never Used  . Alcohol use No     Allergies   Patient has no known allergies.   Review of Systems Review of Systems  Constitutional: Negative for chills and fever.  HENT: Negative for congestion and rhinorrhea.   Eyes: Negative for redness and visual disturbance.  Respiratory: Negative for shortness of breath and wheezing.   Cardiovascular: Negative for chest pain and palpitations.  Gastrointestinal: Negative for nausea and vomiting.  Genitourinary: Negative for dysuria and urgency.  Musculoskeletal: Negative for arthralgias and myalgias.  Skin: Negative for pallor and wound.  Neurological: Negative for dizziness and  headaches.     Physical Exam Updated Vital Signs BP (!) 152/59 (BP Location: Right Arm)   Pulse 62   Temp 97.9 F (36.6 C) (Oral)   Resp 18   Ht _0  (1.651 m)   Wt 53.5 kg (118 lb)   SpO2 100%   BMI 19.64 kg/m   Physical Exam  Constitutional: She is oriented to person, place, and time. She appears well-developed and well-nourished. No distress.  HENT:  Head: Normocephalic and atraumatic.  Eyes: EOM are normal. Pupils are equal, round, and reactive to light.  Neck: Normal range of motion. Neck supple.  Cardiovascular: Normal rate and regular rhythm.  Exam reveals no gallop and no friction  rub.   No murmur heard. Pulmonary/Chest: Effort normal. She has no wheezes. She has no rales.  Abdominal: Soft. She exhibits no distension and no mass. There is no tenderness. There is no guarding.  Percutaneous biliary drain with some erythema at the site with some yellowish drainage. Some erythema underneath the right breast.  Musculoskeletal: She exhibits no edema or tenderness.  Neurological: She is alert and oriented to person, place, and time.  Skin: Skin is warm and dry. She is not diaphoretic.  Psychiatric: She has a normal mood and affect. Her behavior is normal.  Nursing note and vitals reviewed.    ED Treatments / Results  Labs (all labs ordered are listed, but only abnormal results are displayed) Labs Reviewed - No data to display  EKG  EKG Interpretation None       Radiology No results found.  Procedures Procedures (including critical care time)  Medications Ordered in ED Medications - No data to display   Initial Impression / Assessment and Plan / ED Course  I have reviewed the triage vital signs and the nursing notes.  Pertinent labs & imaging results that were available during my care of the patient were reviewed by me and considered in my medical decision making (see chart for details).     83 yo F With a chief complaint of a broken biliary drain. On  my exam the drain is completely separated right before the lock mechanism on her 10.2Fr Mac lock device.  There is a suture in the abdomen at the entrance of the biliary tube. I discussed the case with Dr. Pascal Lux, recommended taping the end of the tube and they'll make an appointment to have it replaced in the morning.  12:39 PM:  I have discussed the diagnosis/risks/treatment options with the patient and family and believe the pt to be eligible for discharge home to follow-up with PCP. We also discussed returning to the ED immediately if new or worsening sx occur. We discussed the sx which are most concerning (e.g., sudden worsening pain, fever, inability to tolerate by mouth) that necessitate immediate return. Medications administered to the patient during their visit and any new prescriptions provided to the patient are listed below.  Medications given during this visit Medications - No data to display   The patient appears reasonably screen and/or stabilized for discharge and I doubt any other medical condition or other [facility] requiring further screening, evaluation, or treatment in the ED at this time prior to discharge.    Final Clinical Impressions(s) / ED Diagnoses   Final diagnoses:  Biliary drain displacement, initial encounter    New Prescriptions New Prescriptions   No medications on file     Deno Etienne, DO 06/21/16 1239

## 2016-06-21 NOTE — ED Triage Notes (Signed)
Pt presents requesting biliary drainage bag related to other one coming off and cannot put bag on; pt denies pain, dislodgement, or symptoms associated with biliary.

## 2016-06-21 NOTE — ED Provider Notes (Signed)
11:00 AM Nursing staff asked that I please see if this patient may be able to be seen through the fast track area. Pt here due to broken biliary bag; was changing chairs at home when the bag got stuck on something and was ripped off the tube; nursing staff thought she just needed a new bag. Per daughter, they called Dr. Blanch Media office and on call provider asked that they come here to have the bag checked for patency and fixed. Of note, the bag has not drained anything since Wednesday (this is going to be a chronic biliary drain, according to GI notes, IR was going to replace it in several weeks, but this was supposed to be a long-term solution)  On evaluation, the biliary drain is intact at the skin, however at the connection with the bag, the suture running through it has snapped, and the tubing is no longer attached to the bag.   Given that it's not been draining, and the bag/tubing appears to have been completely torn apart, this will likely require full replacement of the device, or at least CT imaging to eval if the tube is internally still in place. Given this information, it was deemed most appropriate to have the pt moved to the back for further evaluation. I discussed this with my attending Dr. Tyrone Nine who agreed. Please see other provider's notes for further documentation of care/dispo/etc.    [address], [place], PA-C 06/21/16 [place], [place], [place] 06/21/16 1241

## 2016-06-21 NOTE — Telephone Encounter (Signed)
The patient's daughter called this morning. The patient had a percutaneous biliary tube placed a few weeks ago during recent hospitalization, thought to be poor candidate for ERCP. Daughter reports the patient was transitioning out of a chair this morning and "something fell apart". She thinks the tube remains in the patient but something is disconnected and they don't know how to put it back together, not sure if it tore or if just the cap came off it. Given our offices our closed today, I'm recommending they go to the ER to have the tube checked, ensure it is still in the patient, and fix it. Per last radiology note this tube is likely going to be chronic but they had wanted to change it out in several weeks. Patient's daughter agreed with the plan.   Denice Bors

## 2016-06-21 NOTE — Telephone Encounter (Signed)
Yes. Something for IR to handle. Thanks

## 2016-06-21 NOTE — Discharge Instructions (Signed)
Your should be contacted tomorrow for an appointment to have your tube replaced.  Nothing to eat or drink aftermidnight

## 2016-06-21 NOTE — ED Notes (Signed)
ED Provider at bedside. 

## 2016-06-22 ENCOUNTER — Other Ambulatory Visit (HOSPITAL_COMMUNITY): Payer: Self-pay | Admitting: Interventional Radiology

## 2016-06-22 ENCOUNTER — Encounter (HOSPITAL_COMMUNITY): Payer: Self-pay | Admitting: Interventional Radiology

## 2016-06-22 ENCOUNTER — Ambulatory Visit (HOSPITAL_COMMUNITY)
Admission: RE | Admit: 2016-06-22 | Discharge: 2016-06-22 | Disposition: A | Discharge status: Home / Self Care | Payer: Medicare Other | Source: Ambulatory Visit | Attending: Interventional Radiology | Admitting: Interventional Radiology

## 2016-06-22 DIAGNOSIS — H43813 Vitreous degeneration, bilateral: Secondary | ICD-10-CM | POA: Diagnosis not present

## 2016-06-22 DIAGNOSIS — K8051 Calculus of bile duct without cholangitis or cholecystitis with obstruction: Secondary | ICD-10-CM | POA: Insufficient documentation

## 2016-06-22 DIAGNOSIS — D3131 Benign neoplasm of right choroid: Secondary | ICD-10-CM | POA: Diagnosis not present

## 2016-06-22 DIAGNOSIS — T85590A Other mechanical complication of bile duct prosthesis, initial encounter: Secondary | ICD-10-CM | POA: Diagnosis not present

## 2016-06-22 DIAGNOSIS — H353131 Nonexudative age-related macular degeneration, bilateral, early dry stage: Secondary | ICD-10-CM | POA: Diagnosis not present

## 2016-06-22 DIAGNOSIS — K831 Obstruction of bile duct: Secondary | ICD-10-CM

## 2016-06-22 DIAGNOSIS — H43812 Vitreous degeneration, left eye: Secondary | ICD-10-CM | POA: Diagnosis not present

## 2016-06-22 HISTORY — PX: IR EXCHANGE BILIARY DRAIN: IMG6046

## 2016-06-22 MED ORDER — IOPAMIDOL (ISOVUE-300) INJECTION 61%
50.0000 mL | Freq: Once | INTRAVENOUS | Status: AC | PRN
  Administered 2016-06-22: 15 mL via INTRAVENOUS

## 2016-06-22 MED ORDER — LIDOCAINE HCL 1 % IJ SOLN
INTRAMUSCULAR | Status: AC
  Filled 2016-06-22: qty 20

## 2016-06-22 MED ORDER — IOPAMIDOL (ISOVUE-300) INJECTION 61%
INTRAVENOUS | Status: AC
  Administered 2016-06-22: 15 mL via INTRAVENOUS
  Filled 2016-06-22: qty 50

## 2016-06-22 MED ORDER — LIDOCAINE-EPINEPHRINE (PF) 2 %-1:200000 IJ SOLN
INTRAMUSCULAR | Status: DC | PRN
  Administered 2016-06-22: 10 mL

## 2016-06-22 NOTE — Procedures (Signed)
Biliary obstruction, choledocholithiasis, occluded biliary drain  Fluoroscopic exchange of the left internal/external 10 [place] biliary drain  No immediate complication  Keep to gravity drainage 1 week.  Full report in PACs

## 2016-06-23 DIAGNOSIS — K831 Obstruction of bile duct: Secondary | ICD-10-CM | POA: Diagnosis not present

## 2016-06-23 DIAGNOSIS — Z48815 Encounter for surgical aftercare following surgery on the digestive system: Secondary | ICD-10-CM | POA: Diagnosis not present

## 2016-06-24 ENCOUNTER — Telehealth: Payer: Self-pay | Admitting: Student

## 2016-06-24 NOTE — Telephone Encounter (Signed)
Daughter Vickii Chafe called and left message reporting that patient has had some bloody output from her biliary drain which was replaced 5/29.   PA called and spoke with second daughter, Margaretha Sheffield who reports patient has had significant bilious output from her drain along with some blood.  She states the output is mostly bilious.  Patient has improved since drain exchange.  She did have some vomiting overnight which Margaretha Sheffield states "happens from time to time."  Margaretha Sheffield reports patient is feeling well today.  No nausea, vomiting, or pain.  She has been eating and drinking per her usual.   Drain site per daughter is intact and without erythema or warmth.  Encouraged it is possible to have some bloody output within a few days around drain placement/manipulation which should improve over time.  Encouraged to call back if bloody output changes or patient status changes.   Brynda Greathouse, MMS RDN PA-C 1:41 PM

## 2016-06-25 DIAGNOSIS — Z48815 Encounter for surgical aftercare following surgery on the digestive system: Secondary | ICD-10-CM | POA: Diagnosis not present

## 2016-06-25 DIAGNOSIS — K831 Obstruction of bile duct: Secondary | ICD-10-CM | POA: Diagnosis not present

## 2016-06-28 ENCOUNTER — Ambulatory Visit: Payer: Medicare Other | Admitting: Podiatry

## 2016-06-28 DIAGNOSIS — Z48815 Encounter for surgical aftercare following surgery on the digestive system: Secondary | ICD-10-CM | POA: Diagnosis not present

## 2016-06-28 DIAGNOSIS — K831 Obstruction of bile duct: Secondary | ICD-10-CM | POA: Diagnosis not present

## 2016-06-30 DIAGNOSIS — Z48815 Encounter for surgical aftercare following surgery on the digestive system: Secondary | ICD-10-CM | POA: Diagnosis not present

## 2016-06-30 DIAGNOSIS — K831 Obstruction of bile duct: Secondary | ICD-10-CM | POA: Diagnosis not present

## 2016-07-21 ENCOUNTER — Telehealth: Payer: Self-pay | Admitting: Cardiology

## 2016-07-21 ENCOUNTER — Ambulatory Visit (INDEPENDENT_AMBULATORY_CARE_PROVIDER_SITE_OTHER): Payer: Medicare Other | Admitting: *Deleted

## 2016-07-21 DIAGNOSIS — I495 Sick sinus syndrome: Secondary | ICD-10-CM

## 2016-07-21 NOTE — Telephone Encounter (Signed)
Confirmed remote transmission w/ pt daughter.   

## 2016-07-22 ENCOUNTER — Encounter: Payer: Self-pay | Admitting: Cardiology

## 2016-07-22 DIAGNOSIS — L89159 Pressure ulcer of sacral region, unspecified stage: Secondary | ICD-10-CM | POA: Diagnosis not present

## 2016-07-22 DIAGNOSIS — Z6821 Body mass index (BMI) 21.0-21.9, adult: Secondary | ICD-10-CM | POA: Diagnosis not present

## 2016-07-22 DIAGNOSIS — R7301 Impaired fasting glucose: Secondary | ICD-10-CM | POA: Diagnosis not present

## 2016-07-22 DIAGNOSIS — I4891 Unspecified atrial fibrillation: Secondary | ICD-10-CM | POA: Diagnosis not present

## 2016-07-22 DIAGNOSIS — K831 Obstruction of bile duct: Secondary | ICD-10-CM | POA: Diagnosis not present

## 2016-07-22 DIAGNOSIS — Z950 Presence of cardiac pacemaker: Secondary | ICD-10-CM | POA: Diagnosis not present

## 2016-07-22 LAB — CUP PACEART REMOTE DEVICE CHECK
Battery Remaining Longevity: 116 mo
Battery Remaining Percentage: 95.5 %
Battery Voltage: 3.01 V
Brady Statistic AS VP Percent: 1 %
Brady Statistic RA Percent Paced: 47 %
Date Time Interrogation Session: 20180627153751
Implantable Lead Implant Date: 20160421
Implantable Lead Location: 753859
Implantable Lead Location: 753860
Implantable Pulse Generator Implant Date: 20160421
Lead Channel Impedance Value: 390 Ohm
Lead Channel Pacing Threshold Amplitude: 0.5 V
Lead Channel Pacing Threshold Pulse Width: 0.5 ms
Lead Channel Sensing Intrinsic Amplitude: 1.1 mV
Lead Channel Sensing Intrinsic Amplitude: 12 mV
Lead Channel Setting Pacing Amplitude: 2 V
MDC IDC LEAD IMPLANT DT: 20160421
MDC IDC MSMT LEADCHNL RV IMPEDANCE VALUE: 490 Ohm
MDC IDC MSMT LEADCHNL RV PACING THRESHOLD AMPLITUDE: 1 V
MDC IDC MSMT LEADCHNL RV PACING THRESHOLD PULSEWIDTH: 0.5 ms
MDC IDC SET LEADCHNL RV PACING AMPLITUDE: 2.5 V
MDC IDC SET LEADCHNL RV PACING PULSEWIDTH: 0.5 ms
MDC IDC SET LEADCHNL RV SENSING SENSITIVITY: 2 mV
MDC IDC STAT BRADY AP VP PERCENT: 1 %
MDC IDC STAT BRADY AP VS PERCENT: 50 %
MDC IDC STAT BRADY AS VS PERCENT: 49 %
MDC IDC STAT BRADY RV PERCENT PACED: 1 %
Pulse Gen Model: 2240
Pulse Gen Serial Number: 7761558

## 2016-07-22 NOTE — Progress Notes (Signed)
Remote pacemaker transmission.   

## 2016-07-26 ENCOUNTER — Ambulatory Visit (INDEPENDENT_AMBULATORY_CARE_PROVIDER_SITE_OTHER): Payer: Medicare Other | Admitting: Internal Medicine

## 2016-07-26 ENCOUNTER — Encounter: Payer: Self-pay | Admitting: Internal Medicine

## 2016-07-26 ENCOUNTER — Telehealth: Payer: Self-pay

## 2016-07-26 ENCOUNTER — Other Ambulatory Visit (INDEPENDENT_AMBULATORY_CARE_PROVIDER_SITE_OTHER): Payer: Medicare Other

## 2016-07-26 ENCOUNTER — Encounter (INDEPENDENT_AMBULATORY_CARE_PROVIDER_SITE_OTHER): Payer: Self-pay

## 2016-07-26 VITALS — BP 146/56 | HR 76 | Ht 64.0 in | Wt 127.0 lb

## 2016-07-26 DIAGNOSIS — K831 Obstruction of bile duct: Secondary | ICD-10-CM | POA: Diagnosis not present

## 2016-07-26 DIAGNOSIS — K5901 Slow transit constipation: Secondary | ICD-10-CM

## 2016-07-26 LAB — HEPATIC FUNCTION PANEL
ALK PHOS: 177 U/L — AB (ref 39–117)
ALT: 24 U/L (ref 0–35)
AST: 26 U/L (ref 0–37)
Albumin: 3.2 g/dL — ABNORMAL LOW (ref 3.5–5.2)
BILIRUBIN DIRECT: 0.1 mg/dL (ref 0.0–0.3)
TOTAL PROTEIN: 6.7 g/dL (ref 6.0–8.3)
Total Bilirubin: 0.3 mg/dL (ref 0.2–1.2)

## 2016-07-26 NOTE — Progress Notes (Signed)
HISTORY OF PRESENT ILLNESS:  [name] [name] is a 83 y.o. female with multiple medical problems who was evaluated in the hospital early May 2018 with cholangitis secondary to left intrahepatic stricture with intrahepatic ductal stones. Because of the area of stricture and her Billroth II anatomy (remote history of gastric cancer) she underwent percutaneous drainage procedure. She was seen in follow-up several weeks later after dislodging her catheter. She has elective plans for reassessment of her tube in 4 weeks. This post hospital appointment was scheduled routinely. She has seen her primary care provider Dr. Joylene [name] twice since her hospital discharge. She has had no follow-up blood work. Last comprehensive metabolic panel 15/17/6160 with AST 26, ALT 88, alkaline phosphatase 320, total bilirubin 0.8. She is accompanied today by her daughter. They tell me that her drainage catheter was somewhat displaced approximate 1 week ago. Sutures no longer attached and the drain is out about 4 inches from its original position. She has had no drainage into her bag since. There is some leaking along the tubing when it is irrigated with water. She denies pain, fevers, or jaundice. She does have some irritation of the skin around the insertion site for which she is using zinc oxide cream.  REVIEW OF SYSTEMS:  All non-GI ROS negative except for sinus allergy, arthritis, ankle swelling, urinary leakage  Past Medical History:  Diagnosis Date  . Allergic rhinitis   . Anemia   . Atrial fibrillation ([place])    a. on Eliquis  . Biliary obstruction   . Chronic kidney disease (CKD), stage II (mild)   . DDD (degenerative disc disease), lumbar   . Diabetes ([place])   . DJD (degenerative joint disease)   . GERD (gastroesophageal reflux disease)   . Hyperlipidemia   . Hypertension   . Macular degeneration   . Osteoporosis   . Stomach cancer ([place])    1992  . Tachy-brady syndrome ([facility])    a. s/p STJ dual chamber PPM implant   . Vitamin B 12 deficiency   . Vitamin D deficiency   . Whooping cough     Past Surgical History:  Procedure Laterality Date  . BACK SURGERY    . CHOLECYSTECTOMY    . IR EXCHANGE BILIARY DRAIN  06/22/2016  . IR INT EXT BILIARY DRAIN WITH CHOLANGIOGRAM  06/04/2016  . PARTIAL GASTRECTOMY  1992  . PARTIAL HYSTERECTOMY  1968  . PERMANENT PACEMAKER INSERTION N/A 05/16/2014   STJ Assurity dual chamber pacemaker implanted by Dr Lovena Le  . TOTAL ABDOMINAL HYSTERECTOMY  1991    Social History [name] [name]  reports that she has never smoked. She has never used smokeless tobacco. She reports that she does not drink alcohol or use drugs.  family history includes Heart attack (age of onset: 41) in her son; Heart disease in her father; Pneumonia in her mother; Sudden death (age of onset: 6) in her daughter.  No Known Allergies     PHYSICAL EXAMINATION: Vital signs: BP (!) 146/56   Pulse 76   Ht 5\' 4"  (1.626 m)   Wt 127 lb (57.6 kg)   BMI 21.80 kg/m   Constitutional: Pleasant elderly female who is generally well-appearing, no acute distress Psychiatric: alert and oriented x3, cooperative Eyes: extraocular movements intact, anicteric, conjunctiva pink Mouth: oral pharynx moist, no lesions Neck: supple no lymphadenopathy Cardiovascular: heart regular rate and rhythm, no murmur Lungs: clear to auscultation bilaterally Abdomen: soft, nontender, nondistended, no obvious ascites, no peritoneal signs, normal bowel sounds, no organomegaly.  Biliary drainage catheter insertion site left midline slight surrounding erythema. No pain. Drainage catheter does look like it is somewhat dislodged from its previously secured position Rectal: Omitted clubbing or cyanosis. 1+ Extremities: no lower extremity edema bilaterally Skin: no lesions on visible extremities Neuro: No focal deficits. Nerves intact  ASSESSMENT:  #1. Left intrahepatic choledocholithiasis above left hepatic duct stricture #2. Recent  hospitalization with cholangitis secondary to #1 above  #3. Status post percutaneous biliary drainage. No drainage into bag for one week since dislodgment of catheter. No clinical issues but this should be looked at sooner rather than later #4. Status post Billroth II anatomy  PLAN:  1. LFTs today 2. We have contacted interventional radiology who will assess the patient's tube tomorrow 3. Ongoing management percutaneous tube with interventional radiology 4. Resume general medical care with Dr. Joylene [name] 5. GI follow-up on an as-needed basis  25 minutes spent face-to-face with the patient. Greater than 50% a time use for counseling regarding her recent biliary issues and management forward

## 2016-07-26 NOTE — Patient Instructions (Signed)
Your physician has requested that you go to the basement for the following lab work before leaving today:  [name] [name] have been scheduled with Interventional Radiology at [facility] to have your biliary tube checked tomorrow, 07/27/2016 at 2:00pm.  Arrive at 1:30pm and check in at radiology

## 2016-07-26 NOTE — Telephone Encounter (Signed)
Left message for interventional radiology to see why the had rescheduled patient's appointment to Friday.  Left message on patient's voicemail that I will call her as soon as I hear from them.

## 2016-07-27 NOTE — Telephone Encounter (Signed)
Spoke with Nira Conn at Interventional Radiology who told me they had a bunch of inpatients who had to be scheduled and so patient's appointment had to be moved to Friday 07/30/2016 at [place] with patient's daughter and explained this to her and told her to keep the Friday appointment at [facility].  She agreed.

## 2016-07-30 ENCOUNTER — Encounter (HOSPITAL_COMMUNITY): Payer: Self-pay | Admitting: Interventional Radiology

## 2016-07-30 ENCOUNTER — Ambulatory Visit (HOSPITAL_COMMUNITY)
Admission: RE | Admit: 2016-07-30 | Discharge: 2016-07-30 | Disposition: A | Discharge status: Home / Self Care | Payer: Medicare Other | Source: Ambulatory Visit | Attending: Interventional Radiology | Admitting: Interventional Radiology

## 2016-07-30 DIAGNOSIS — Z4682 Encounter for fitting and adjustment of non-vascular catheter: Secondary | ICD-10-CM | POA: Insufficient documentation

## 2016-07-30 DIAGNOSIS — K830 Cholangitis: Secondary | ICD-10-CM | POA: Diagnosis not present

## 2016-07-30 DIAGNOSIS — K831 Obstruction of bile duct: Secondary | ICD-10-CM | POA: Diagnosis not present

## 2016-07-30 DIAGNOSIS — T85590A Other mechanical complication of bile duct prosthesis, initial encounter: Secondary | ICD-10-CM | POA: Diagnosis not present

## 2016-07-30 HISTORY — PX: IR EXCHANGE BILIARY DRAIN: IMG6046

## 2016-07-30 MED ORDER — IOPAMIDOL (ISOVUE-300) INJECTION 61%
INTRAVENOUS | Status: AC
  Administered 2016-07-30: 10 mL
  Filled 2016-07-30: qty 50

## 2016-07-30 MED ORDER — LIDOCAINE HCL (PF) 1 % IJ SOLN
INTRAMUSCULAR | Status: AC
  Filled 2016-07-30: qty 30

## 2016-07-30 MED ORDER — LIDOCAINE HCL (PF) 1 % IJ SOLN
INTRAMUSCULAR | Status: DC | PRN
  Administered 2016-07-30: 5 mL

## 2016-07-30 NOTE — Procedures (Signed)
Interventional Radiology Procedure Note  Procedure: Exchange/rescue of int/ext biliary drain, with 70F drain placed into the duodenum.  To drain.   History of stricture, with cholangitis/biliary obstruction treated in May 2018.  Surgical hx of Billroth II.   Complications: None  Recommendations:  - to drain - DC home now - Routine drain exchange in ~6 months or as needed. - Do not submerge  - Routine care   Signed,  Dulcy Fanny. Earleen Newport, DO

## 2016-08-18 ENCOUNTER — Telehealth: Payer: Self-pay | Admitting: Internal Medicine

## 2016-08-18 NOTE — Telephone Encounter (Signed)
Per office visit 07/26/16.  Needs GI follow up as needed. Daughter notified

## 2016-08-19 DIAGNOSIS — K831 Obstruction of bile duct: Secondary | ICD-10-CM | POA: Diagnosis not present

## 2016-08-19 DIAGNOSIS — I129 Hypertensive chronic kidney disease with stage 1 through stage 4 chronic kidney disease, or unspecified chronic kidney disease: Secondary | ICD-10-CM | POA: Diagnosis not present

## 2016-08-19 DIAGNOSIS — M5136 Other intervertebral disc degeneration, lumbar region: Secondary | ICD-10-CM | POA: Diagnosis not present

## 2016-08-19 DIAGNOSIS — I4891 Unspecified atrial fibrillation: Secondary | ICD-10-CM | POA: Diagnosis not present

## 2016-08-19 DIAGNOSIS — I4892 Unspecified atrial flutter: Secondary | ICD-10-CM | POA: Diagnosis not present

## 2016-08-19 DIAGNOSIS — E1122 Type 2 diabetes mellitus with diabetic chronic kidney disease: Secondary | ICD-10-CM | POA: Diagnosis not present

## 2016-08-19 DIAGNOSIS — E784 Other hyperlipidemia: Secondary | ICD-10-CM | POA: Diagnosis not present

## 2016-08-19 DIAGNOSIS — L89320 Pressure ulcer of left buttock, unstageable: Secondary | ICD-10-CM | POA: Diagnosis not present

## 2016-08-19 DIAGNOSIS — M810 Age-related osteoporosis without current pathological fracture: Secondary | ICD-10-CM | POA: Diagnosis not present

## 2016-08-19 DIAGNOSIS — M170 Bilateral primary osteoarthritis of knee: Secondary | ICD-10-CM | POA: Diagnosis not present

## 2016-08-19 DIAGNOSIS — D631 Anemia in chronic kidney disease: Secondary | ICD-10-CM | POA: Diagnosis not present

## 2016-08-19 DIAGNOSIS — N182 Chronic kidney disease, stage 2 (mild): Secondary | ICD-10-CM | POA: Diagnosis not present

## 2016-08-21 IMAGING — CR DG CHEST 1V PORT
1 series · 1 of 1 positions shown · non-contrast
Comparison: 05/17/2014

CLINICAL DATA: Pneumothorax

EXAM:
PORTABLE CHEST - 1 VIEW

[AP]
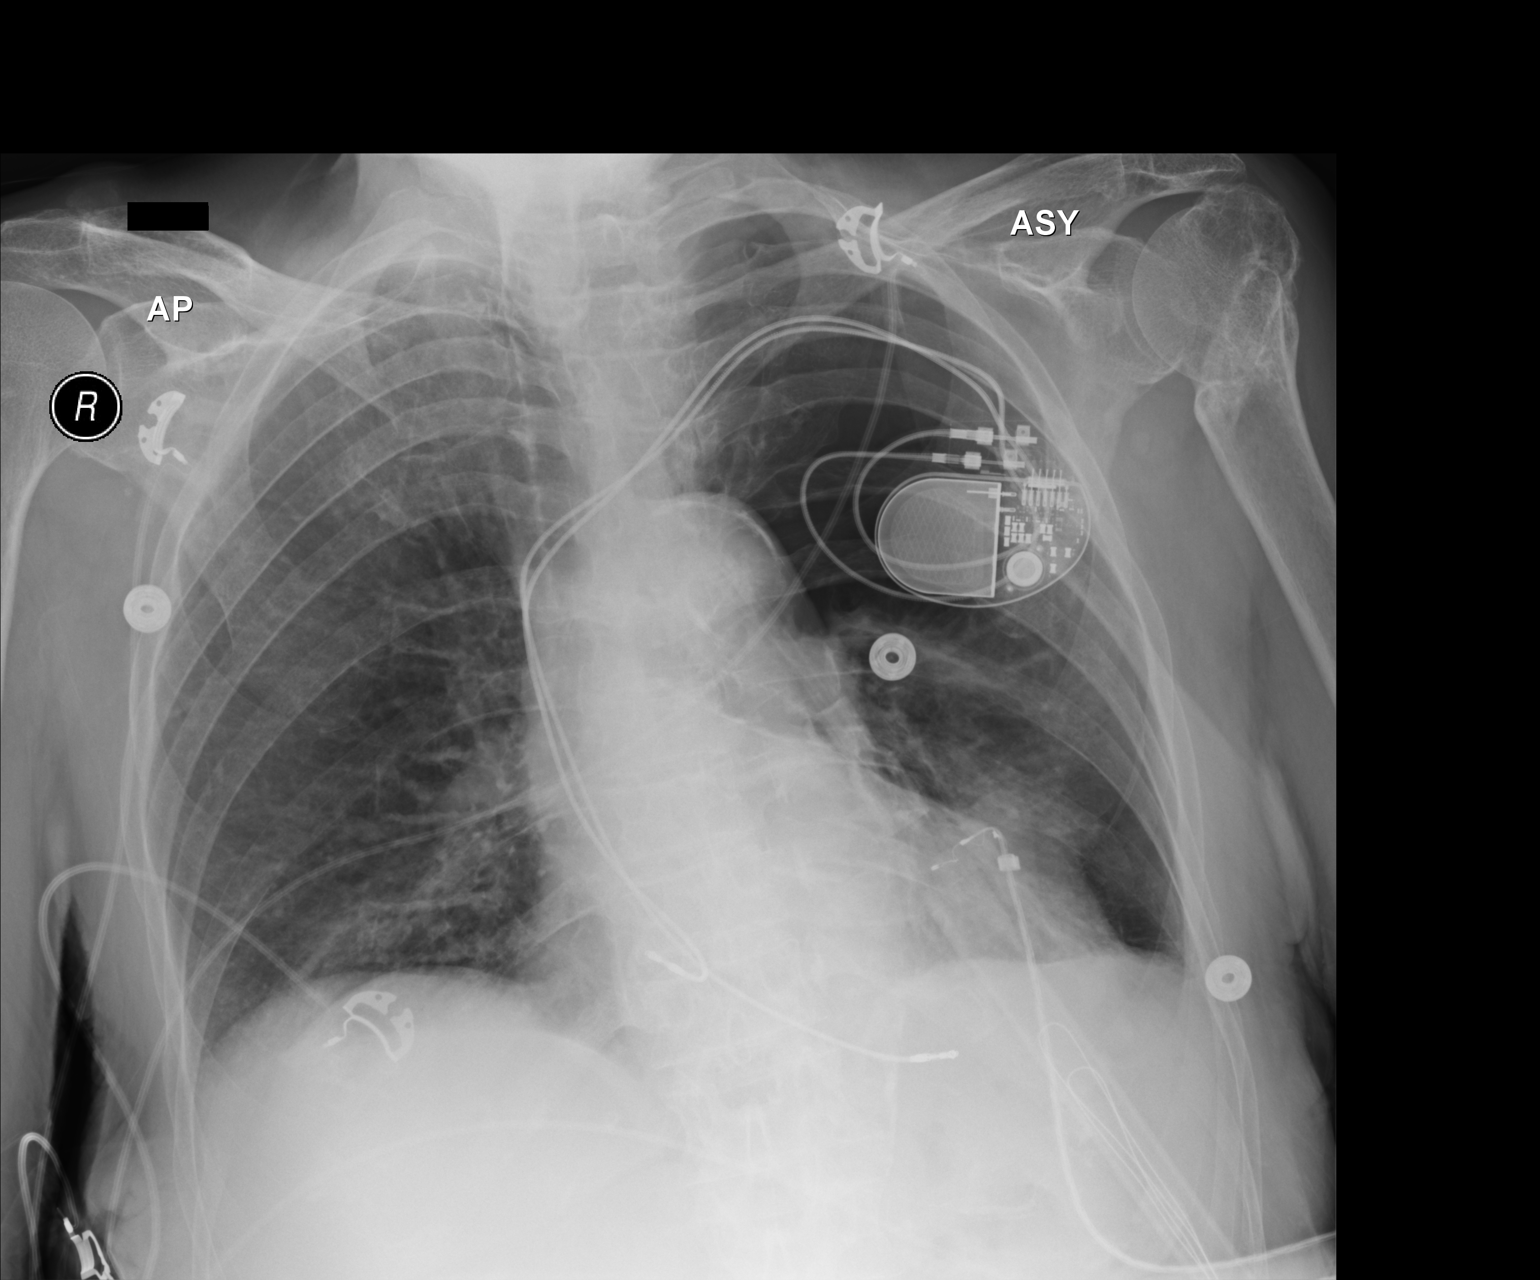

[1 of 1 positions shown; findings below may reference images not displayed]

FINDINGS: Cardiomediastinal silhouette is stable. Dual lead cardiac pacemaker
is unchanged in position. Slight increased left pneumothorax about
50 %. There is atelectasis of left lung. Right lung is clear.
IMPRESSION: Dual lead cardiac pacemaker is unchanged in position. Slight
increased left pneumothorax about 50 %. There is atelectasis of left
lung. Right lung is clear.

These results were called by telephone at the time of interpretation
acknowledged these results.

## 2016-08-22 IMAGING — CR DG CHEST 1V PORT
1 series · 1 of 1 positions shown · non-contrast
Comparison: One-view chest 05/17/2014

CLINICAL DATA: Pneumothorax.

EXAM:
PORTABLE CHEST - 1 VIEW

[AP]
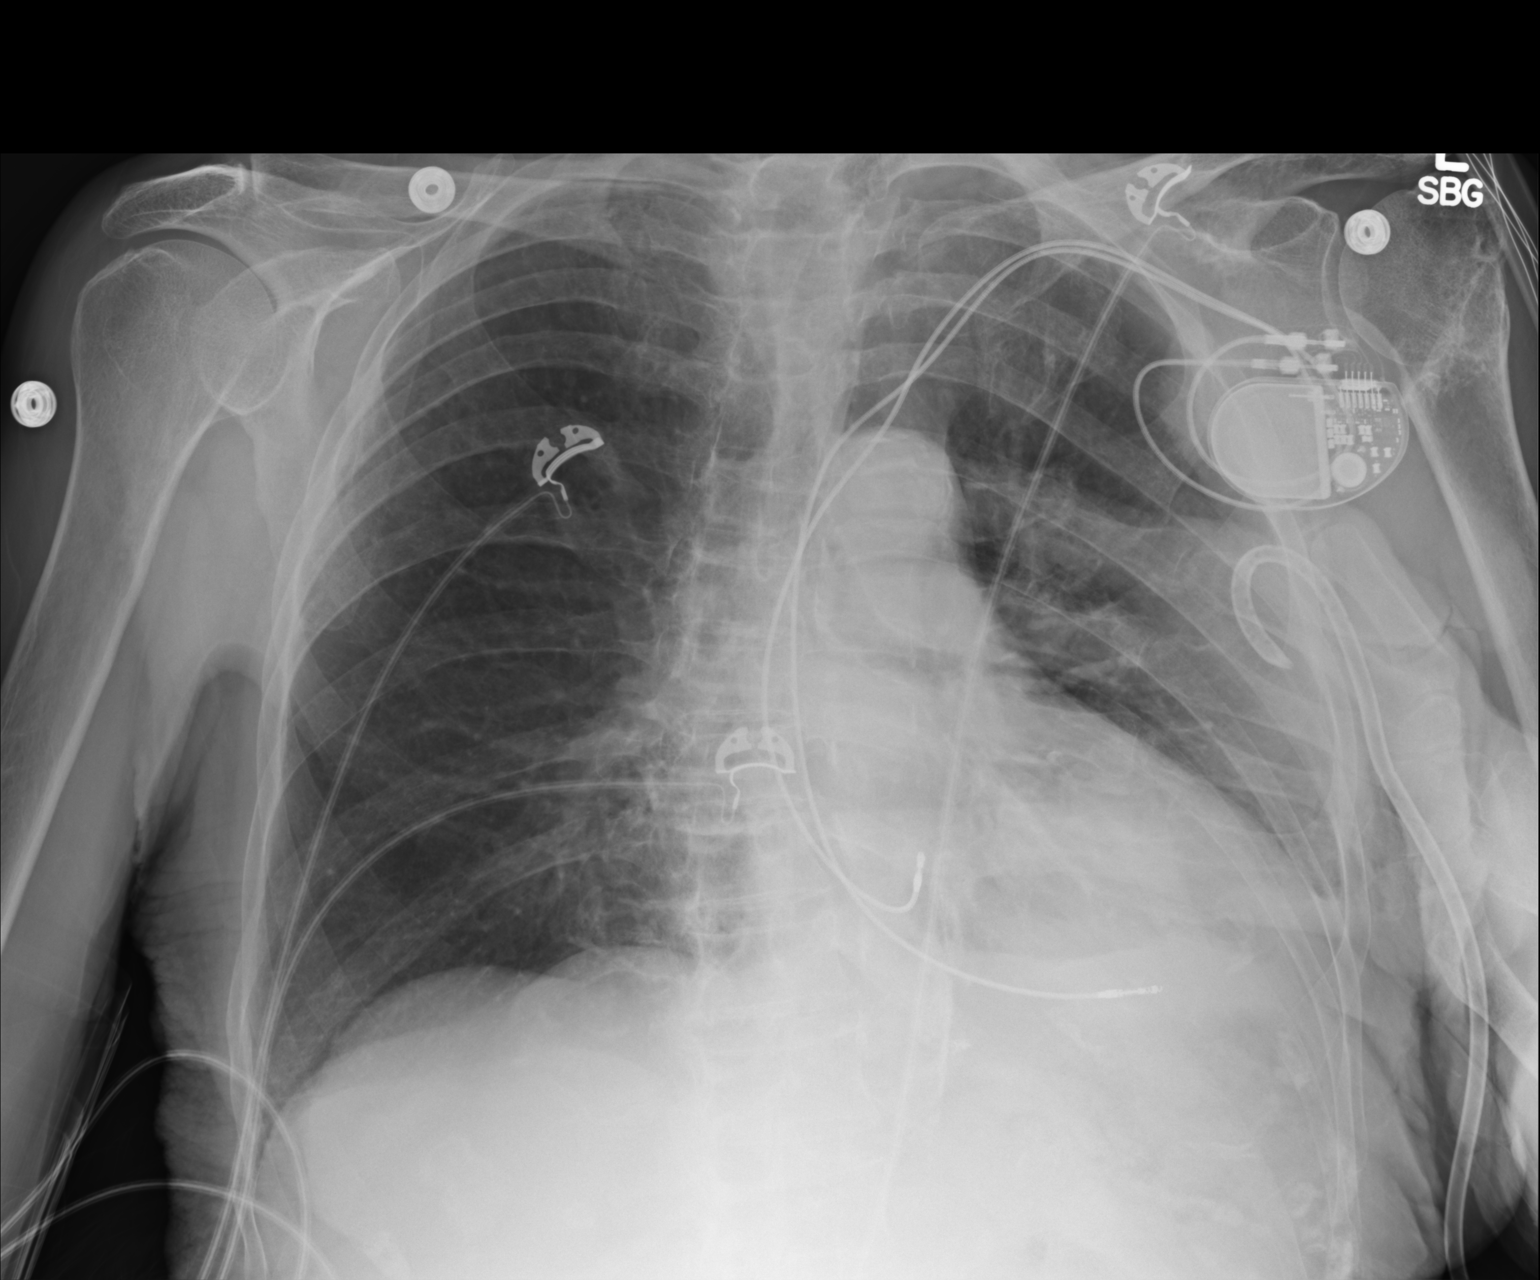

[1 of 1 positions shown; findings below may reference images not displayed]

FINDINGS: A left apical pneumothorax is slightly smaller than on the prior
study. A pleural drain remains in place. Pacing wires are again
noted.

Left basilar airspace disease has slightly increased. A left pleural
effusion is suspected. Posttraumatic changes of left shoulder are
again noted.
IMPRESSION: 1. Slight decrease in small left apical pneumothorax.
2. Stable positioning of the left pleural drain.
3. Increasing left pleural effusion and basilar airspace disease.
While this likely reflects atelectasis, early infection must also be
considered.

## 2016-08-25 DIAGNOSIS — L89320 Pressure ulcer of left buttock, unstageable: Secondary | ICD-10-CM | POA: Diagnosis not present

## 2016-08-25 DIAGNOSIS — K831 Obstruction of bile duct: Secondary | ICD-10-CM | POA: Diagnosis not present

## 2016-08-26 ENCOUNTER — Other Ambulatory Visit (HOSPITAL_COMMUNITY): Payer: Medicare Other

## 2016-09-01 DIAGNOSIS — L89320 Pressure ulcer of left buttock, unstageable: Secondary | ICD-10-CM | POA: Diagnosis not present

## 2016-09-01 DIAGNOSIS — K831 Obstruction of bile duct: Secondary | ICD-10-CM | POA: Diagnosis not present

## 2016-09-08 DIAGNOSIS — L89320 Pressure ulcer of left buttock, unstageable: Secondary | ICD-10-CM | POA: Diagnosis not present

## 2016-09-08 DIAGNOSIS — K831 Obstruction of bile duct: Secondary | ICD-10-CM | POA: Diagnosis not present

## 2016-09-15 DIAGNOSIS — K831 Obstruction of bile duct: Secondary | ICD-10-CM | POA: Diagnosis not present

## 2016-09-15 DIAGNOSIS — L89320 Pressure ulcer of left buttock, unstageable: Secondary | ICD-10-CM | POA: Diagnosis not present

## 2016-09-20 DIAGNOSIS — L89159 Pressure ulcer of sacral region, unspecified stage: Secondary | ICD-10-CM | POA: Diagnosis not present

## 2016-09-20 DIAGNOSIS — I10 Essential (primary) hypertension: Secondary | ICD-10-CM | POA: Diagnosis not present

## 2016-09-20 DIAGNOSIS — E119 Type 2 diabetes mellitus without complications: Secondary | ICD-10-CM | POA: Diagnosis not present

## 2016-09-20 DIAGNOSIS — Z950 Presence of cardiac pacemaker: Secondary | ICD-10-CM | POA: Diagnosis not present

## 2016-09-20 DIAGNOSIS — Z681 Body mass index (BMI) 19 or less, adult: Secondary | ICD-10-CM | POA: Diagnosis not present

## 2016-09-20 DIAGNOSIS — K831 Obstruction of bile duct: Secondary | ICD-10-CM | POA: Diagnosis not present

## 2016-09-20 DIAGNOSIS — Z23 Encounter for immunization: Secondary | ICD-10-CM | POA: Diagnosis not present

## 2016-09-22 DIAGNOSIS — K831 Obstruction of bile duct: Secondary | ICD-10-CM | POA: Diagnosis not present

## 2016-09-22 DIAGNOSIS — L89320 Pressure ulcer of left buttock, unstageable: Secondary | ICD-10-CM | POA: Diagnosis not present

## 2016-09-28 DIAGNOSIS — L89320 Pressure ulcer of left buttock, unstageable: Secondary | ICD-10-CM | POA: Diagnosis not present

## 2016-09-28 DIAGNOSIS — K831 Obstruction of bile duct: Secondary | ICD-10-CM | POA: Diagnosis not present

## 2016-09-29 ENCOUNTER — Other Ambulatory Visit (HOSPITAL_COMMUNITY): Payer: Self-pay | Admitting: Interventional Radiology

## 2016-09-29 DIAGNOSIS — K831 Obstruction of bile duct: Secondary | ICD-10-CM

## 2016-10-01 ENCOUNTER — Encounter (HOSPITAL_COMMUNITY): Payer: Self-pay | Admitting: Interventional Radiology

## 2016-10-01 ENCOUNTER — Other Ambulatory Visit (HOSPITAL_COMMUNITY): Payer: Self-pay | Admitting: Interventional Radiology

## 2016-10-01 ENCOUNTER — Ambulatory Visit (HOSPITAL_COMMUNITY)
Admission: RE | Admit: 2016-10-01 | Discharge: 2016-10-01 | Disposition: A | Discharge status: Home / Self Care | Payer: Medicare Other | Source: Ambulatory Visit | Attending: Interventional Radiology | Admitting: Interventional Radiology

## 2016-10-01 ENCOUNTER — Telehealth (HOSPITAL_COMMUNITY): Payer: Self-pay | Admitting: Radiology

## 2016-10-01 DIAGNOSIS — Y828 Other medical devices associated with adverse incidents: Secondary | ICD-10-CM | POA: Insufficient documentation

## 2016-10-01 DIAGNOSIS — K831 Obstruction of bile duct: Secondary | ICD-10-CM | POA: Insufficient documentation

## 2016-10-01 DIAGNOSIS — T85590A Other mechanical complication of bile duct prosthesis, initial encounter: Secondary | ICD-10-CM | POA: Diagnosis not present

## 2016-10-01 DIAGNOSIS — T85510A Breakdown (mechanical) of bile duct prosthesis, initial encounter: Secondary | ICD-10-CM | POA: Diagnosis not present

## 2016-10-01 HISTORY — PX: IR EXCHANGE BILIARY DRAIN: IMG6046

## 2016-10-01 MED ORDER — LIDOCAINE HCL 1 % IJ SOLN
INTRAMUSCULAR | Status: DC | PRN
  Administered 2016-10-01: 5 mL

## 2016-10-01 MED ORDER — IOPAMIDOL (ISOVUE-300) INJECTION 61%
INTRAVENOUS | Status: AC
  Administered 2016-10-01: 10 mL via INTRAVENOUS
  Filled 2016-10-01: qty 50

## 2016-10-01 MED ORDER — LIDOCAINE HCL (PF) 2 % IJ SOLN
INTRAMUSCULAR | Status: AC
  Filled 2016-10-01: qty 10

## 2016-10-01 MED ORDER — IOPAMIDOL (ISOVUE-300) INJECTION 61%
50.0000 mL | Freq: Once | INTRAVENOUS | Status: AC | PRN
  Administered 2016-10-01: 10 mL via INTRAVENOUS

## 2016-10-06 DIAGNOSIS — K831 Obstruction of bile duct: Secondary | ICD-10-CM | POA: Diagnosis not present

## 2016-10-06 DIAGNOSIS — L89320 Pressure ulcer of left buttock, unstageable: Secondary | ICD-10-CM | POA: Diagnosis not present

## 2016-10-13 DIAGNOSIS — L89320 Pressure ulcer of left buttock, unstageable: Secondary | ICD-10-CM | POA: Diagnosis not present

## 2016-10-13 DIAGNOSIS — K831 Obstruction of bile duct: Secondary | ICD-10-CM | POA: Diagnosis not present

## 2016-10-20 ENCOUNTER — Ambulatory Visit (INDEPENDENT_AMBULATORY_CARE_PROVIDER_SITE_OTHER): Payer: Medicare Other | Admitting: *Deleted

## 2016-10-20 DIAGNOSIS — I495 Sick sinus syndrome: Secondary | ICD-10-CM | POA: Diagnosis not present

## 2016-10-20 NOTE — Progress Notes (Signed)
Remote pacemaker transmission.   

## 2016-10-21 ENCOUNTER — Encounter: Payer: Self-pay | Admitting: Cardiology

## 2016-10-22 LAB — CUP PACEART REMOTE DEVICE CHECK
Battery Remaining Longevity: 116 mo
Battery Remaining Percentage: 95.5 %
Brady Statistic AP VS Percent: 51 %
Brady Statistic AS VP Percent: 1 %
Brady Statistic AS VS Percent: 49 %
Date Time Interrogation Session: 20180926070354
Implantable Lead Implant Date: 20160421
Implantable Lead Location: 753859
Implantable Lead Location: 753860
Lead Channel Impedance Value: 400 Ohm
Lead Channel Impedance Value: 460 Ohm
Lead Channel Pacing Threshold Amplitude: 1 V
Lead Channel Pacing Threshold Pulse Width: 0.5 ms
Lead Channel Sensing Intrinsic Amplitude: 12 mV
Lead Channel Setting Pacing Amplitude: 2 V
Lead Channel Setting Pacing Pulse Width: 0.5 ms
Lead Channel Setting Sensing Sensitivity: 2 mV
MDC IDC LEAD IMPLANT DT: 20160421
MDC IDC MSMT BATTERY VOLTAGE: 3.01 V
MDC IDC MSMT LEADCHNL RA PACING THRESHOLD AMPLITUDE: 0.5 V
MDC IDC MSMT LEADCHNL RA SENSING INTR AMPL: 1.5 mV
MDC IDC MSMT LEADCHNL RV PACING THRESHOLD PULSEWIDTH: 0.5 ms
MDC IDC PG IMPLANT DT: 20160421
MDC IDC SET LEADCHNL RV PACING AMPLITUDE: 2.5 V
MDC IDC STAT BRADY AP VP PERCENT: 1 %
MDC IDC STAT BRADY RA PERCENT PACED: 49 %
MDC IDC STAT BRADY RV PERCENT PACED: 1 %
Pulse Gen Model: 2240
Pulse Gen Serial Number: 7761558

## 2016-12-02 ENCOUNTER — Ambulatory Visit (HOSPITAL_COMMUNITY)
Admission: RE | Admit: 2016-12-02 | Discharge: 2016-12-02 | Disposition: A | Discharge status: Home / Self Care | Payer: Medicare Other | Source: Ambulatory Visit | Attending: Interventional Radiology | Admitting: Interventional Radiology

## 2016-12-02 ENCOUNTER — Other Ambulatory Visit (HOSPITAL_COMMUNITY): Payer: Self-pay | Admitting: Interventional Radiology

## 2016-12-02 ENCOUNTER — Encounter (HOSPITAL_COMMUNITY): Payer: Self-pay | Admitting: Interventional Radiology

## 2016-12-02 DIAGNOSIS — K8309 Other cholangitis: Secondary | ICD-10-CM | POA: Diagnosis not present

## 2016-12-02 DIAGNOSIS — T85590A Other mechanical complication of bile duct prosthesis, initial encounter: Secondary | ICD-10-CM | POA: Diagnosis not present

## 2016-12-02 DIAGNOSIS — K831 Obstruction of bile duct: Secondary | ICD-10-CM

## 2016-12-02 DIAGNOSIS — Y828 Other medical devices associated with adverse incidents: Secondary | ICD-10-CM | POA: Insufficient documentation

## 2016-12-02 DIAGNOSIS — T85510A Breakdown (mechanical) of bile duct prosthesis, initial encounter: Secondary | ICD-10-CM | POA: Diagnosis not present

## 2016-12-02 HISTORY — PX: IR EXCHANGE BILIARY DRAIN: IMG6046

## 2016-12-02 MED ORDER — LIDOCAINE HCL 1 % IJ SOLN
INTRAMUSCULAR | Status: AC | PRN
  Administered 2016-12-02: 5 mL

## 2016-12-02 MED ORDER — IOPAMIDOL (ISOVUE-300) INJECTION 61%
25.0000 mL | Freq: Once | INTRAVENOUS | Status: AC | PRN
  Administered 2016-12-02: 15 mL

## 2016-12-02 NOTE — Procedures (Signed)
  Procedure: Exchange and reposition int/ext bili drain catheter 62f Preprocedure diagnosis: Biliary stenosis Postprocedure diagnosis: same EBL:   minimal Complications:  none immediate  See full dictation in [employer].  Dillard Cannon MD Main # [phone] Pager  [phone]

## 2016-12-24 ENCOUNTER — Encounter (HOSPITAL_COMMUNITY): Payer: Self-pay | Admitting: Interventional Radiology

## 2016-12-24 ENCOUNTER — Ambulatory Visit (HOSPITAL_COMMUNITY)
Admission: RE | Admit: 2016-12-24 | Discharge: 2016-12-24 | Disposition: A | Discharge status: Home / Self Care | Payer: Medicare Other | Source: Ambulatory Visit | Attending: Interventional Radiology | Admitting: Interventional Radiology

## 2016-12-24 ENCOUNTER — Other Ambulatory Visit (HOSPITAL_COMMUNITY): Payer: Self-pay | Admitting: Interventional Radiology

## 2016-12-24 DIAGNOSIS — K8309 Other cholangitis: Secondary | ICD-10-CM | POA: Diagnosis not present

## 2016-12-24 DIAGNOSIS — K831 Obstruction of bile duct: Secondary | ICD-10-CM | POA: Insufficient documentation

## 2016-12-24 DIAGNOSIS — Z4682 Encounter for fitting and adjustment of non-vascular catheter: Secondary | ICD-10-CM | POA: Diagnosis not present

## 2016-12-24 DIAGNOSIS — T85590A Other mechanical complication of bile duct prosthesis, initial encounter: Secondary | ICD-10-CM | POA: Diagnosis not present

## 2016-12-24 HISTORY — PX: IR EXCHANGE BILIARY DRAIN: IMG6046

## 2016-12-24 MED ORDER — LIDOCAINE HCL 1 % IJ SOLN
INTRAMUSCULAR | Status: AC | PRN
  Administered 2016-12-24: 5 mL

## 2016-12-24 MED ORDER — IOPAMIDOL (ISOVUE-300) INJECTION 61%
INTRAVENOUS | Status: AC
  Filled 2016-12-24: qty 50

## 2016-12-24 MED ORDER — IOPAMIDOL (ISOVUE-300) INJECTION 61%
10.0000 mL | Freq: Once | INTRAVENOUS | Status: AC | PRN
  Administered 2016-12-24: 10 mL

## 2016-12-24 MED ORDER — LIDOCAINE HCL 1 % IJ SOLN
INTRAMUSCULAR | Status: AC
  Filled 2016-12-24: qty 20

## 2016-12-24 NOTE — Procedures (Signed)
Interventional Radiology Procedure Note  Procedure: Biliary drain exchange  Complications: None  Estimated Blood Loss: < 10 mL  Recommendations: New 10 Fr int/ext biliary drainage catheter placed over wire; formed in duodenum. 2 Prolene retention sutures applied. Will trial capping of tube and internal drainage; daughter given extra bag to take home for prn gravity drainage.  Venetia Night. Kathlene Cote, M.D Pager:  [phone]

## 2016-12-27 DIAGNOSIS — Z1389 Encounter for screening for other disorder: Secondary | ICD-10-CM | POA: Diagnosis not present

## 2016-12-27 DIAGNOSIS — K831 Obstruction of bile duct: Secondary | ICD-10-CM | POA: Diagnosis not present

## 2016-12-27 DIAGNOSIS — L89159 Pressure ulcer of sacral region, unspecified stage: Secondary | ICD-10-CM | POA: Diagnosis not present

## 2016-12-27 DIAGNOSIS — Z950 Presence of cardiac pacemaker: Secondary | ICD-10-CM | POA: Diagnosis not present

## 2016-12-27 DIAGNOSIS — I10 Essential (primary) hypertension: Secondary | ICD-10-CM | POA: Diagnosis not present

## 2016-12-27 DIAGNOSIS — E538 Deficiency of other specified B group vitamins: Secondary | ICD-10-CM | POA: Diagnosis not present

## 2016-12-27 DIAGNOSIS — I4891 Unspecified atrial fibrillation: Secondary | ICD-10-CM | POA: Diagnosis not present

## 2016-12-27 DIAGNOSIS — E119 Type 2 diabetes mellitus without complications: Secondary | ICD-10-CM | POA: Diagnosis not present

## 2016-12-27 DIAGNOSIS — Z681 Body mass index (BMI) 19 or less, adult: Secondary | ICD-10-CM | POA: Diagnosis not present

## 2017-01-03 ENCOUNTER — Other Ambulatory Visit (HOSPITAL_COMMUNITY): Payer: Medicare Other

## 2017-01-06 ENCOUNTER — Other Ambulatory Visit: Payer: Self-pay | Admitting: Internal Medicine

## 2017-01-06 MED ORDER — DILTIAZEM HCL ER COATED BEADS 120 MG PO CP24: 120.0000 mg | ORAL_CAPSULE | Freq: Every day | ORAL | 2 refills | Status: DC

## 2017-01-20 ENCOUNTER — Ambulatory Visit (INDEPENDENT_AMBULATORY_CARE_PROVIDER_SITE_OTHER): Payer: Medicare Other | Admitting: *Deleted

## 2017-01-20 DIAGNOSIS — I495 Sick sinus syndrome: Secondary | ICD-10-CM | POA: Diagnosis not present

## 2017-01-20 NOTE — Progress Notes (Signed)
Remote pacemaker transmission.   

## 2017-01-21 ENCOUNTER — Encounter: Payer: Self-pay | Admitting: Cardiology

## 2017-01-27 ENCOUNTER — Other Ambulatory Visit (HOSPITAL_COMMUNITY): Payer: Medicare Other

## 2017-01-31 ENCOUNTER — Other Ambulatory Visit: Payer: Self-pay | Admitting: Physician Assistant

## 2017-02-07 LAB — CUP PACEART REMOTE DEVICE CHECK
Battery Remaining Longevity: 116 mo
Battery Remaining Percentage: 95.5 %
Brady Statistic AS VP Percent: 1 %
Brady Statistic RA Percent Paced: 54 %
Date Time Interrogation Session: 20181227070013
Implantable Lead Implant Date: 20160421
Implantable Lead Location: 753859
Lead Channel Impedance Value: 400 Ohm
Lead Channel Pacing Threshold Amplitude: 0.5 V
Lead Channel Pacing Threshold Amplitude: 1 V
Lead Channel Pacing Threshold Pulse Width: 0.5 ms
Lead Channel Sensing Intrinsic Amplitude: 12 mV
Lead Channel Sensing Intrinsic Amplitude: 2 mV
Lead Channel Setting Pacing Amplitude: 2 V
MDC IDC LEAD IMPLANT DT: 20160421
MDC IDC LEAD LOCATION: 753860
MDC IDC MSMT BATTERY VOLTAGE: 3.01 V
MDC IDC MSMT LEADCHNL RV IMPEDANCE VALUE: 490 Ohm
MDC IDC MSMT LEADCHNL RV PACING THRESHOLD PULSEWIDTH: 0.5 ms
MDC IDC PG IMPLANT DT: 20160421
MDC IDC PG SERIAL: 7761558
MDC IDC SET LEADCHNL RV PACING AMPLITUDE: 2.5 V
MDC IDC SET LEADCHNL RV PACING PULSEWIDTH: 0.5 ms
MDC IDC SET LEADCHNL RV SENSING SENSITIVITY: 2 mV
MDC IDC STAT BRADY AP VP PERCENT: 1 %
MDC IDC STAT BRADY AP VS PERCENT: 55 %
MDC IDC STAT BRADY AS VS PERCENT: 44 %
MDC IDC STAT BRADY RV PERCENT PACED: 1 %
Pulse Gen Model: 2240

## 2017-03-17 ENCOUNTER — Ambulatory Visit (HOSPITAL_COMMUNITY)
Admission: RE | Admit: 2017-03-17 | Discharge: 2017-03-17 | Disposition: A | Discharge status: Home / Self Care | Payer: Medicare Other | Source: Ambulatory Visit | Attending: Interventional Radiology | Admitting: Interventional Radiology

## 2017-03-17 ENCOUNTER — Encounter (HOSPITAL_COMMUNITY): Payer: Self-pay | Admitting: Interventional Radiology

## 2017-03-17 ENCOUNTER — Other Ambulatory Visit (HOSPITAL_COMMUNITY): Payer: Self-pay | Admitting: Interventional Radiology

## 2017-03-17 DIAGNOSIS — K831 Obstruction of bile duct: Secondary | ICD-10-CM

## 2017-03-17 DIAGNOSIS — Z434 Encounter for attention to other artificial openings of digestive tract: Secondary | ICD-10-CM | POA: Diagnosis not present

## 2017-03-17 DIAGNOSIS — Z4682 Encounter for fitting and adjustment of non-vascular catheter: Secondary | ICD-10-CM | POA: Insufficient documentation

## 2017-03-17 HISTORY — PX: IR EXCHANGE BILIARY DRAIN: IMG6046

## 2017-03-17 MED ORDER — LIDOCAINE HCL 1 % IJ SOLN
INTRAMUSCULAR | Status: AC
  Filled 2017-03-17: qty 20

## 2017-03-17 MED ORDER — IOPAMIDOL (ISOVUE-300) INJECTION 61%
INTRAVENOUS | Status: AC
  Administered 2017-03-17: 10 mL
  Filled 2017-03-17: qty 50

## 2017-03-17 MED ORDER — LIDOCAINE HCL 1 % IJ SOLN
INTRAMUSCULAR | Status: DC | PRN
  Administered 2017-03-17: 5 mL

## 2017-03-17 NOTE — Procedures (Signed)
L 10 Fr Int/Ext bili drain exchange EBL 0 Comp 0

## 2017-04-11 ENCOUNTER — Other Ambulatory Visit: Payer: Self-pay | Admitting: Internal Medicine

## 2017-04-11 MED ORDER — ATORVASTATIN CALCIUM 20 MG PO TABS: ORAL_TABLET | ORAL | 0 refills | Status: DC

## 2017-04-11 MED ORDER — DILTIAZEM HCL ER COATED BEADS 120 MG PO CP24: 120.0000 mg | ORAL_CAPSULE | Freq: Every day | ORAL | 0 refills | Status: DC

## 2017-04-15 ENCOUNTER — Encounter: Payer: Self-pay | Admitting: Internal Medicine

## 2017-04-21 ENCOUNTER — Ambulatory Visit (INDEPENDENT_AMBULATORY_CARE_PROVIDER_SITE_OTHER): Payer: Medicare Other | Admitting: *Deleted

## 2017-04-21 ENCOUNTER — Telehealth: Payer: Self-pay | Admitting: Internal Medicine

## 2017-04-21 DIAGNOSIS — I495 Sick sinus syndrome: Secondary | ICD-10-CM | POA: Diagnosis not present

## 2017-04-21 NOTE — Telephone Encounter (Signed)
Confirmed with patient's daughter peggy that we successfully received patients remote transmission.

## 2017-04-21 NOTE — Telephone Encounter (Signed)
New message     1. Has your device fired? NO  2. Is you device beeping? NO  3. Are you experiencing draining or swelling at device site? NO  4. Are you calling to see if we received your device transmission? Patient daughter calling to request assistance sending transmission  5. Have you passed out? NO    Please route to [place]

## 2017-04-22 NOTE — Progress Notes (Signed)
Remote pacemaker transmission.   

## 2017-04-25 ENCOUNTER — Encounter: Payer: Self-pay | Admitting: Cardiology

## 2017-04-26 ENCOUNTER — Ambulatory Visit (INDEPENDENT_AMBULATORY_CARE_PROVIDER_SITE_OTHER): Payer: Medicare Other | Admitting: Internal Medicine

## 2017-04-26 ENCOUNTER — Encounter: Payer: Self-pay | Admitting: Internal Medicine

## 2017-04-26 VITALS — BP 176/80 | HR 63 | Ht 64.0 in | Wt 123.0 lb

## 2017-04-26 DIAGNOSIS — Z950 Presence of cardiac pacemaker: Secondary | ICD-10-CM

## 2017-04-26 DIAGNOSIS — I480 Paroxysmal atrial fibrillation: Secondary | ICD-10-CM

## 2017-04-26 DIAGNOSIS — I10 Essential (primary) hypertension: Secondary | ICD-10-CM

## 2017-04-26 DIAGNOSIS — R001 Bradycardia, unspecified: Secondary | ICD-10-CM

## 2017-04-26 LAB — CUP PACEART INCLINIC DEVICE CHECK
Battery Voltage: 3.01 V
Brady Statistic RA Percent Paced: 59 %
Implantable Lead Implant Date: 20160421
Implantable Lead Location: 753859
Implantable Pulse Generator Implant Date: 20160421
Lead Channel Impedance Value: 375 Ohm
Lead Channel Pacing Threshold Amplitude: 0.75 V
Lead Channel Pacing Threshold Amplitude: 0.75 V
Lead Channel Pacing Threshold Amplitude: 1.25 V
Lead Channel Pacing Threshold Pulse Width: 0.5 ms
Lead Channel Pacing Threshold Pulse Width: 0.5 ms
Lead Channel Pacing Threshold Pulse Width: 0.5 ms
Lead Channel Sensing Intrinsic Amplitude: 1.3 mV
Lead Channel Sensing Intrinsic Amplitude: 12 mV
Lead Channel Setting Pacing Amplitude: 2 V
Lead Channel Setting Pacing Pulse Width: 0.5 ms
MDC IDC LEAD IMPLANT DT: 20160421
MDC IDC LEAD LOCATION: 753860
MDC IDC MSMT BATTERY REMAINING LONGEVITY: 126 mo
MDC IDC MSMT LEADCHNL RV IMPEDANCE VALUE: 450 Ohm
MDC IDC MSMT LEADCHNL RV PACING THRESHOLD AMPLITUDE: 1.25 V
MDC IDC MSMT LEADCHNL RV PACING THRESHOLD PULSEWIDTH: 0.5 ms
MDC IDC PG SERIAL: 7761558
MDC IDC SESS DTM: 20190402153131
MDC IDC SET LEADCHNL RV PACING AMPLITUDE: 2.5 V
MDC IDC SET LEADCHNL RV SENSING SENSITIVITY: 2 mV
MDC IDC STAT BRADY RV PERCENT PACED: 0.26 %

## 2017-04-26 NOTE — Progress Notes (Addendum)
HPI [name] [name] returns today for followup. She is a pleasant 84 yo woman with symptomatic bradycardia, PAF, HTN, who underwent PPM insertion 3 years ago. She returns today for followup. She has no chest pain or sob. No syncope. She has no specific complaints today. She has had some problems with biliary obstruction and had a drain placed. No fever or chills. Her appetite is good.  No Known Allergies   Current Outpatient Medications  Medication Sig Dispense Refill  . acetaminophen (TYLENOL) 500 MG tablet Take 500 mg by mouth every 6 (six) hours as needed for mild pain.    Marland Kitchen atorvastatin (LIPITOR) 20 MG tablet TAKE 1 TABLET (20 MG TOTAL) BY MOUTH AT BEDTIME. Please keep upcoming appt for future refills. Thank you 90 tablet 0  . Calcium Carbonate-Vitamin D (CALCIUM 600/VITAMIN D PO) Take 2 tablets by mouth daily.    . Cyanocobalamin (NASCOBAL NA) Place 1 spray into both nostrils once a week. saturday    . diltiazem (CARDIZEM CD) 120 MG 24 hr capsule Take 1 capsule (120 mg total) by mouth daily. Please keep upcoming appt for future refills. Thank you 90 capsule 0  . ELIQUIS 2.5 MG TABS tablet TAKE 1 TABLET BY MOUTH TWICE A DAY 60 tablet 4  . Ergocalciferol (VITAMIN D2 PO) Take 1.25 mg by mouth once a week.    . ferrous sulfate 325 (65 FE) MG tablet Take 325 mg by mouth daily with breakfast.    . Fexofenadine HCl (MUCINEX ALLERGY PO) Take by mouth.    . hydrochlorothiazide (HYDRODIURIL) 25 MG tablet Take 12.5 mg by mouth daily.    Marland Kitchen loratadine (CLARITIN) 10 MG tablet Take 10 mg by mouth daily.    Marland Kitchen LORazepam (ATIVAN) 1 MG tablet Take 1 mg by mouth at bedtime.    . metoprolol (LOPRESSOR) 50 MG tablet Take 50 mg by mouth 2 (two) times daily.    . mometasone (NASONEX) 50 MCG/ACT nasal spray Place 2 sprays into both nostrils daily.    . Multiple Vitamins-Minerals (MULTIVITAMIN WITH MINERALS) tablet Take 1 tablet by mouth daily.    . Omega-3 Fatty Acids (FISH OIL PO) Take 12 mg by mouth  daily.    Vladimir Faster Glycol-Propyl Glycol 0.4-0.3 % SOLN Apply 1 drop to eye daily as needed (dryness).    . sucralfate (CARAFATE) 1 G tablet Take 1 g by mouth 4 (four) times daily -  before meals and at bedtime.    . traMADol (ULTRAM) 50 MG tablet Take 50 mg by mouth 2 (two) times daily.     Marland Kitchen denosumab (PROLIA) 60 MG/ML SOLN injection Inject 60 mg into the skin every 6 (six) months. Administer in upper arm, thigh, or abdomen     No current facility-administered medications for this visit.      Past Medical History:  Diagnosis Date  . Allergic rhinitis   . Anemia   . Atrial fibrillation ([place])    a. on Eliquis  . Biliary obstruction   . Chronic kidney disease (CKD), stage II (mild)   . DDD (degenerative disc disease), lumbar   . Diabetes ([place])   . DJD (degenerative joint disease)   . GERD (gastroesophageal reflux disease)   . Hyperlipidemia   . Hypertension   . Macular degeneration   . Osteoporosis   . Stomach cancer ([place])    1992  . Tachy-brady syndrome ([facility])    a. s/p STJ dual chamber PPM implant  . Vitamin B 12 deficiency   .  Vitamin D deficiency   . Whooping cough     ROS:   All systems reviewed and negative except as noted in the HPI.   Past Surgical History:  Procedure Laterality Date  . BACK SURGERY    . CHOLECYSTECTOMY    . IR EXCHANGE BILIARY DRAIN  06/22/2016  . IR EXCHANGE BILIARY DRAIN  07/30/2016  . IR EXCHANGE BILIARY DRAIN  10/01/2016  . IR EXCHANGE BILIARY DRAIN  12/02/2016  . IR EXCHANGE BILIARY DRAIN  12/24/2016  . IR EXCHANGE BILIARY DRAIN  03/17/2017  . IR INT EXT BILIARY DRAIN WITH CHOLANGIOGRAM  06/04/2016  . PARTIAL GASTRECTOMY  1992  . PARTIAL HYSTERECTOMY  1968  . PERMANENT PACEMAKER INSERTION N/A 05/16/2014   STJ Assurity dual chamber pacemaker implanted by Dr Lovena Le  . TOTAL ABDOMINAL HYSTERECTOMY  1991     Family History  Problem Relation Age of Onset  . Heart disease Father        Dropsy  . Pneumonia Mother   . Sudden death Daughter  66  . Heart attack Son 53  . Colon cancer Neg Hx   . Esophageal cancer Neg Hx   . Pancreatic cancer Neg Hx   . Stomach cancer Neg Hx      Social History   Socioeconomic History  . Marital status: Married    Spouse name: Not on file  . Number of children: 4  . Years of education: Not on file  . Highest education level: Not on file  Occupational History  . Not on file  Social Needs  . Financial resource strain: Not on file  . Food insecurity:    Worry: Not on file    Inability: Not on file  . Transportation needs:    Medical: Not on file    Non-medical: Not on file  Tobacco Use  . Smoking status: Never Smoker  . Smokeless tobacco: Never Used  Substance and Sexual Activity  . Alcohol use: No  . Drug use: No  . Sexual activity: Not on file  Lifestyle  . Physical activity:    Days per week: Not on file    Minutes per session: Not on file  . Stress: Not on file  Relationships  . Social connections:    Talks on phone: Not on file    Gets together: Not on file    Attends religious service: Not on file    Active member of club or organization: Not on file    Attends meetings of clubs or organizations: Not on file    Relationship status: Not on file  . Intimate partner violence:    Fear of current or ex partner: Not on file    Emotionally abused: Not on file    Physically abused: Not on file    Forced sexual activity: Not on file  Other Topics Concern  . Not on file  Social History Narrative   Lives alone.       BP (!) 176/80   Pulse 63   Ht 5\' 4"  (1.626 m)   Wt 123 lb (55.8 kg)   BMI 21.11 kg/m   Physical Exam:  Well appearing 84 yo woman, NAD HEENT: Unremarkable Neck:  6 cm JVD, no thyromegally Lymphatics:  No adenopathy Back:  No CVA tenderness Lungs:  Clear with no wheezes HEART:  Regular rate rhythm, no murmurs, no rubs, no clicks Abd:  soft, positive bowel sounds, no organomegally, no rebound, no guarding Ext:  2 plus pulses, no edema,  no  cyanosis, no clubbing Skin:  No rashes no nodules Neuro:  CN II through XII intact, motor grossly intact  EKG - atrial pacing  DEVICE  Normal device function.  See PaceArt for details.   Assess/Plan: 1. Sinus node dysfunction - she is asymptomatic, s/p PPM insertion. 2. PPM - her St. Jude DDD PM is working normally with over 9 years of battery longevity. 3. HTN - her blood pressure is high today but she did not take her meds this morning. Her daughter notes that her blood pressure is usually well controlled.   Mikle Bosworth.D.

## 2017-04-26 NOTE — Patient Instructions (Signed)

## 2017-05-05 LAB — CUP PACEART REMOTE DEVICE CHECK
Battery Remaining Longevity: 115 mo
Battery Remaining Percentage: 95.5 %
Brady Statistic RA Percent Paced: 59 %
Date Time Interrogation Session: 20190328060020
Implantable Lead Implant Date: 20160421
Implantable Lead Location: 753859
Lead Channel Impedance Value: 390 Ohm
Lead Channel Pacing Threshold Amplitude: 1 V
Lead Channel Pacing Threshold Pulse Width: 0.5 ms
Lead Channel Sensing Intrinsic Amplitude: 1.1 mV
Lead Channel Setting Pacing Amplitude: 2 V
Lead Channel Setting Pacing Pulse Width: 0.5 ms
MDC IDC LEAD IMPLANT DT: 20160421
MDC IDC LEAD LOCATION: 753860
MDC IDC MSMT BATTERY VOLTAGE: 3.01 V
MDC IDC MSMT LEADCHNL RA PACING THRESHOLD AMPLITUDE: 0.5 V
MDC IDC MSMT LEADCHNL RV IMPEDANCE VALUE: 460 Ohm
MDC IDC MSMT LEADCHNL RV PACING THRESHOLD PULSEWIDTH: 0.5 ms
MDC IDC MSMT LEADCHNL RV SENSING INTR AMPL: 12 mV
MDC IDC PG IMPLANT DT: 20160421
MDC IDC SET LEADCHNL RV PACING AMPLITUDE: 2.5 V
MDC IDC SET LEADCHNL RV SENSING SENSITIVITY: 2 mV
MDC IDC STAT BRADY AP VP PERCENT: 1 %
MDC IDC STAT BRADY AP VS PERCENT: 60 %
MDC IDC STAT BRADY AS VP PERCENT: 1 %
MDC IDC STAT BRADY AS VS PERCENT: 40 %
MDC IDC STAT BRADY RV PERCENT PACED: 1 %
Pulse Gen Model: 2240
Pulse Gen Serial Number: 7761558

## 2017-05-16 DIAGNOSIS — E7849 Other hyperlipidemia: Secondary | ICD-10-CM | POA: Diagnosis not present

## 2017-05-16 DIAGNOSIS — E538 Deficiency of other specified B group vitamins: Secondary | ICD-10-CM | POA: Diagnosis not present

## 2017-05-16 DIAGNOSIS — E119 Type 2 diabetes mellitus without complications: Secondary | ICD-10-CM | POA: Diagnosis not present

## 2017-05-16 DIAGNOSIS — E559 Vitamin D deficiency, unspecified: Secondary | ICD-10-CM | POA: Diagnosis not present

## 2017-05-16 DIAGNOSIS — R946 Abnormal results of thyroid function studies: Secondary | ICD-10-CM | POA: Diagnosis not present

## 2017-05-23 DIAGNOSIS — R0781 Pleurodynia: Secondary | ICD-10-CM | POA: Diagnosis not present

## 2017-05-23 DIAGNOSIS — N183 Chronic kidney disease, stage 3 (moderate): Secondary | ICD-10-CM | POA: Diagnosis not present

## 2017-05-23 DIAGNOSIS — Z1389 Encounter for screening for other disorder: Secondary | ICD-10-CM | POA: Diagnosis not present

## 2017-05-23 DIAGNOSIS — Z0000 Encounter for general adult medical examination without abnormal findings: Secondary | ICD-10-CM | POA: Diagnosis not present

## 2017-05-23 DIAGNOSIS — K831 Obstruction of bile duct: Secondary | ICD-10-CM | POA: Diagnosis not present

## 2017-05-23 DIAGNOSIS — R2689 Other abnormalities of gait and mobility: Secondary | ICD-10-CM | POA: Diagnosis not present

## 2017-05-23 DIAGNOSIS — Z6821 Body mass index (BMI) 21.0-21.9, adult: Secondary | ICD-10-CM | POA: Diagnosis not present

## 2017-05-23 DIAGNOSIS — Z950 Presence of cardiac pacemaker: Secondary | ICD-10-CM | POA: Diagnosis not present

## 2017-05-23 DIAGNOSIS — D6489 Other specified anemias: Secondary | ICD-10-CM | POA: Diagnosis not present

## 2017-05-23 DIAGNOSIS — R82998 Other abnormal findings in urine: Secondary | ICD-10-CM | POA: Diagnosis not present

## 2017-05-23 DIAGNOSIS — M810 Age-related osteoporosis without current pathological fracture: Secondary | ICD-10-CM | POA: Diagnosis not present

## 2017-05-23 DIAGNOSIS — E119 Type 2 diabetes mellitus without complications: Secondary | ICD-10-CM | POA: Diagnosis not present

## 2017-05-23 DIAGNOSIS — I4891 Unspecified atrial fibrillation: Secondary | ICD-10-CM | POA: Diagnosis not present

## 2017-06-03 ENCOUNTER — Other Ambulatory Visit (HOSPITAL_COMMUNITY): Payer: Self-pay

## 2017-06-06 ENCOUNTER — Ambulatory Visit (HOSPITAL_COMMUNITY)
Admission: RE | Admit: 2017-06-06 | Discharge: 2017-06-06 | Disposition: A | Discharge status: Home / Self Care | Payer: Medicare Other | Source: Ambulatory Visit | Attending: Internal Medicine | Admitting: Internal Medicine

## 2017-06-06 DIAGNOSIS — M810 Age-related osteoporosis without current pathological fracture: Secondary | ICD-10-CM | POA: Insufficient documentation

## 2017-06-06 MED ORDER — DENOSUMAB 60 MG/ML ~~LOC~~ SOSY
60.0000 mg | PREFILLED_SYRINGE | Freq: Once | SUBCUTANEOUS | Status: AC
  Administered 2017-06-06: 60 mg via SUBCUTANEOUS
  Filled 2017-06-06: qty 1

## 2017-06-09 ENCOUNTER — Ambulatory Visit (HOSPITAL_COMMUNITY)
Admission: RE | Admit: 2017-06-09 | Discharge: 2017-06-09 | Disposition: A | Discharge status: Home / Self Care | Payer: Medicare Other | Source: Ambulatory Visit | Attending: Interventional Radiology | Admitting: Interventional Radiology

## 2017-06-09 ENCOUNTER — Encounter (HOSPITAL_COMMUNITY): Payer: Self-pay | Admitting: Interventional Radiology

## 2017-06-09 ENCOUNTER — Other Ambulatory Visit (HOSPITAL_COMMUNITY): Payer: Self-pay | Admitting: Interventional Radiology

## 2017-06-09 DIAGNOSIS — Z4803 Encounter for change or removal of drains: Secondary | ICD-10-CM | POA: Insufficient documentation

## 2017-06-09 DIAGNOSIS — K831 Obstruction of bile duct: Secondary | ICD-10-CM

## 2017-06-09 HISTORY — PX: IR EXCHANGE BILIARY DRAIN: IMG6046

## 2017-06-09 MED ORDER — LIDOCAINE HCL 1 % IJ SOLN
INTRAMUSCULAR | Status: AC
  Filled 2017-06-09: qty 20

## 2017-06-09 MED ORDER — LIDOCAINE HCL 1 % IJ SOLN
INTRAMUSCULAR | Status: DC | PRN
  Administered 2017-06-09: 5 mL

## 2017-06-09 MED ORDER — IOPAMIDOL (ISOVUE-300) INJECTION 61%
50.0000 mL | Freq: Once | INTRAVENOUS | Status: AC | PRN
  Administered 2017-06-09: 10 mL

## 2017-06-09 MED ORDER — IOPAMIDOL (ISOVUE-300) INJECTION 61%
INTRAVENOUS | Status: AC
Start: 2017-06-09 — End: 2017-06-09
  Administered 2017-06-09: 10 mL
  Filled 2017-06-09: qty 50

## 2017-06-09 MED ORDER — SODIUM CHLORIDE 0.9% FLUSH: 5.0000 mL | Freq: Three times a day (TID) | INTRAVENOUS | Status: DC

## 2017-06-09 NOTE — Procedures (Signed)
Interventional Radiology Procedure Note  Procedure:  Image guided Int/Ext biliary drain exchange, with new 71F drain.  Capped for internal drainage.  Complications: None Recommendations:  - Capped DC now  Signed,  Dulcy Fanny. Earleen Newport, DO

## 2017-06-28 DIAGNOSIS — H43812 Vitreous degeneration, left eye: Secondary | ICD-10-CM | POA: Diagnosis not present

## 2017-06-28 DIAGNOSIS — H43813 Vitreous degeneration, bilateral: Secondary | ICD-10-CM | POA: Diagnosis not present

## 2017-06-28 DIAGNOSIS — D3131 Benign neoplasm of right choroid: Secondary | ICD-10-CM | POA: Diagnosis not present

## 2017-06-28 DIAGNOSIS — H353131 Nonexudative age-related macular degeneration, bilateral, early dry stage: Secondary | ICD-10-CM | POA: Diagnosis not present

## 2017-07-09 ENCOUNTER — Other Ambulatory Visit: Payer: Self-pay | Admitting: Internal Medicine

## 2017-07-21 ENCOUNTER — Encounter: Payer: Self-pay | Admitting: Cardiology

## 2017-07-21 ENCOUNTER — Ambulatory Visit (INDEPENDENT_AMBULATORY_CARE_PROVIDER_SITE_OTHER): Payer: Medicare Other | Admitting: *Deleted

## 2017-07-21 DIAGNOSIS — R001 Bradycardia, unspecified: Secondary | ICD-10-CM | POA: Diagnosis not present

## 2017-07-21 NOTE — Progress Notes (Signed)
Remote pacemaker transmission.   

## 2017-07-21 NOTE — Progress Notes (Signed)
Letter  

## 2017-07-26 LAB — CUP PACEART REMOTE DEVICE CHECK
Battery Remaining Longevity: 113 mo
Battery Remaining Percentage: 95.5 %
Battery Voltage: 2.99 V
Brady Statistic RV Percent Paced: 1 %
Date Time Interrogation Session: 20190627060022
Implantable Lead Implant Date: 20160421
Implantable Lead Location: 753859
Implantable Lead Location: 753860
Implantable Pulse Generator Implant Date: 20160421
Lead Channel Pacing Threshold Amplitude: 0.75 V
Lead Channel Pacing Threshold Pulse Width: 0.5 ms
Lead Channel Sensing Intrinsic Amplitude: 1.4 mV
Lead Channel Setting Sensing Sensitivity: 2 mV
MDC IDC LEAD IMPLANT DT: 20160421
MDC IDC MSMT LEADCHNL RA IMPEDANCE VALUE: 380 Ohm
MDC IDC MSMT LEADCHNL RV IMPEDANCE VALUE: 490 Ohm
MDC IDC MSMT LEADCHNL RV PACING THRESHOLD AMPLITUDE: 1.25 V
MDC IDC MSMT LEADCHNL RV PACING THRESHOLD PULSEWIDTH: 0.5 ms
MDC IDC MSMT LEADCHNL RV SENSING INTR AMPL: 12 mV
MDC IDC SET LEADCHNL RA PACING AMPLITUDE: 2 V
MDC IDC SET LEADCHNL RV PACING AMPLITUDE: 2.5 V
MDC IDC SET LEADCHNL RV PACING PULSEWIDTH: 0.5 ms
MDC IDC STAT BRADY AP VP PERCENT: 1 %
MDC IDC STAT BRADY AP VS PERCENT: 74 %
MDC IDC STAT BRADY AS VP PERCENT: 1 %
MDC IDC STAT BRADY AS VS PERCENT: 25 %
MDC IDC STAT BRADY RA PERCENT PACED: 74 %
Pulse Gen Model: 2240
Pulse Gen Serial Number: 7761558

## 2017-09-09 ENCOUNTER — Encounter (HOSPITAL_COMMUNITY): Payer: Self-pay | Admitting: Interventional Radiology

## 2017-09-09 ENCOUNTER — Ambulatory Visit (HOSPITAL_COMMUNITY)
Admission: RE | Admit: 2017-09-09 | Discharge: 2017-09-09 | Disposition: A | Discharge status: Home / Self Care | Payer: Medicare Other | Source: Ambulatory Visit | Attending: Interventional Radiology | Admitting: Interventional Radiology

## 2017-09-09 DIAGNOSIS — K831 Obstruction of bile duct: Secondary | ICD-10-CM | POA: Diagnosis not present

## 2017-09-09 HISTORY — PX: IR EXCHANGE BILIARY DRAIN: IMG6046

## 2017-09-09 MED ORDER — IOPAMIDOL (ISOVUE-300) INJECTION 61%
INTRAVENOUS | Status: AC
  Filled 2017-09-09: qty 50

## 2017-09-09 MED ORDER — LIDOCAINE HCL 1 % IJ SOLN
INTRAMUSCULAR | Status: AC | PRN
  Administered 2017-09-09: 10 mL

## 2017-09-09 MED ORDER — LIDOCAINE HCL 1 % IJ SOLN
INTRAMUSCULAR | Status: AC
  Filled 2017-09-09: qty 20

## 2017-09-09 MED ORDER — IOPAMIDOL (ISOVUE-300) INJECTION 61%
10.0000 mL | Freq: Once | INTRAVENOUS | Status: AC | PRN
  Administered 2017-09-09: 10 mL

## 2017-09-13 ENCOUNTER — Other Ambulatory Visit (HOSPITAL_COMMUNITY): Payer: Self-pay | Admitting: Interventional Radiology

## 2017-09-13 DIAGNOSIS — K831 Obstruction of bile duct: Secondary | ICD-10-CM

## 2017-10-20 ENCOUNTER — Ambulatory Visit (INDEPENDENT_AMBULATORY_CARE_PROVIDER_SITE_OTHER): Payer: Medicare Other | Admitting: *Deleted

## 2017-10-20 DIAGNOSIS — R001 Bradycardia, unspecified: Secondary | ICD-10-CM

## 2017-10-20 NOTE — Progress Notes (Signed)
Remote pacemaker transmission.   

## 2017-10-21 ENCOUNTER — Encounter: Payer: Self-pay | Admitting: Cardiology

## 2017-11-18 LAB — CUP PACEART REMOTE DEVICE CHECK
Battery Remaining Longevity: 112 mo
Battery Remaining Percentage: 95.5 %
Brady Statistic AP VS Percent: 76 %
Brady Statistic AS VP Percent: 1 %
Brady Statistic AS VS Percent: 24 %
Brady Statistic RA Percent Paced: 76 %
Date Time Interrogation Session: 20190926075029
Implantable Lead Implant Date: 20160421
Implantable Lead Location: 753860
Lead Channel Impedance Value: 360 Ohm
Lead Channel Pacing Threshold Amplitude: 1.25 V
Lead Channel Pacing Threshold Pulse Width: 0.5 ms
Lead Channel Sensing Intrinsic Amplitude: 1.2 mV
Lead Channel Sensing Intrinsic Amplitude: 12 mV
Lead Channel Setting Pacing Amplitude: 2 V
Lead Channel Setting Pacing Pulse Width: 0.5 ms
MDC IDC LEAD IMPLANT DT: 20160421
MDC IDC LEAD LOCATION: 753859
MDC IDC MSMT BATTERY VOLTAGE: 2.99 V
MDC IDC MSMT LEADCHNL RA PACING THRESHOLD AMPLITUDE: 0.75 V
MDC IDC MSMT LEADCHNL RA PACING THRESHOLD PULSEWIDTH: 0.5 ms
MDC IDC MSMT LEADCHNL RV IMPEDANCE VALUE: 460 Ohm
MDC IDC PG IMPLANT DT: 20160421
MDC IDC SET LEADCHNL RV PACING AMPLITUDE: 2.5 V
MDC IDC SET LEADCHNL RV SENSING SENSITIVITY: 2 mV
MDC IDC STAT BRADY AP VP PERCENT: 1 %
MDC IDC STAT BRADY RV PERCENT PACED: 1 %
Pulse Gen Model: 2240
Pulse Gen Serial Number: 7761558

## 2017-11-21 DIAGNOSIS — F4320 Adjustment disorder, unspecified: Secondary | ICD-10-CM | POA: Diagnosis not present

## 2017-11-21 DIAGNOSIS — Z23 Encounter for immunization: Secondary | ICD-10-CM | POA: Diagnosis not present

## 2017-11-21 DIAGNOSIS — I4891 Unspecified atrial fibrillation: Secondary | ICD-10-CM | POA: Diagnosis not present

## 2017-11-21 DIAGNOSIS — J988 Other specified respiratory disorders: Secondary | ICD-10-CM | POA: Diagnosis not present

## 2017-11-21 DIAGNOSIS — Z950 Presence of cardiac pacemaker: Secondary | ICD-10-CM | POA: Diagnosis not present

## 2017-11-21 DIAGNOSIS — I10 Essential (primary) hypertension: Secondary | ICD-10-CM | POA: Diagnosis not present

## 2017-11-21 DIAGNOSIS — M810 Age-related osteoporosis without current pathological fracture: Secondary | ICD-10-CM | POA: Diagnosis not present

## 2017-11-21 DIAGNOSIS — Z6821 Body mass index (BMI) 21.0-21.9, adult: Secondary | ICD-10-CM | POA: Diagnosis not present

## 2017-11-21 DIAGNOSIS — E119 Type 2 diabetes mellitus without complications: Secondary | ICD-10-CM | POA: Diagnosis not present

## 2017-12-02 ENCOUNTER — Other Ambulatory Visit (HOSPITAL_COMMUNITY): Payer: Self-pay | Admitting: Interventional Radiology

## 2017-12-02 ENCOUNTER — Ambulatory Visit (HOSPITAL_COMMUNITY)
Admission: RE | Admit: 2017-12-02 | Discharge: 2017-12-02 | Disposition: A | Discharge status: Home / Self Care | Payer: Medicare Other | Source: Ambulatory Visit | Attending: Interventional Radiology | Admitting: Interventional Radiology

## 2017-12-02 ENCOUNTER — Encounter (HOSPITAL_COMMUNITY): Payer: Self-pay | Admitting: Interventional Radiology

## 2017-12-02 DIAGNOSIS — K831 Obstruction of bile duct: Secondary | ICD-10-CM | POA: Insufficient documentation

## 2017-12-02 DIAGNOSIS — Z4803 Encounter for change or removal of drains: Secondary | ICD-10-CM | POA: Diagnosis not present

## 2017-12-02 HISTORY — PX: IR EXCHANGE BILIARY DRAIN: IMG6046

## 2017-12-02 MED ORDER — IOPAMIDOL (ISOVUE-300) INJECTION 61%
INTRAVENOUS | Status: AC
  Administered 2017-12-02: 10 mL
  Filled 2017-12-02: qty 50

## 2017-12-02 MED ORDER — IOPAMIDOL (ISOVUE-300) INJECTION 61%
50.0000 mL | Freq: Once | INTRAVENOUS | Status: AC | PRN
  Administered 2017-12-02: 10 mL

## 2017-12-02 NOTE — Procedures (Signed)
Interventional Radiology Procedure Note  Procedure: Routine exchange of int/ext biliary drain.  New 45cm drain.  .  Complications: None Recommendations:  - to gravity - Do not submerge - Routine  care   Signed,  Dulcy Fanny. Earleen Newport, DO

## 2017-12-16 ENCOUNTER — Other Ambulatory Visit (HOSPITAL_COMMUNITY): Payer: Self-pay | Admitting: *Deleted

## 2017-12-19 ENCOUNTER — Ambulatory Visit (HOSPITAL_COMMUNITY)
Admission: RE | Admit: 2017-12-19 | Discharge: 2017-12-19 | Disposition: A | Discharge status: Home / Self Care | Payer: Medicare Other | Source: Ambulatory Visit | Attending: Internal Medicine | Admitting: Internal Medicine

## 2017-12-19 DIAGNOSIS — M810 Age-related osteoporosis without current pathological fracture: Secondary | ICD-10-CM | POA: Diagnosis not present

## 2017-12-19 MED ORDER — DENOSUMAB 60 MG/ML ~~LOC~~ SOSY
60.0000 mg | PREFILLED_SYRINGE | Freq: Once | SUBCUTANEOUS | Status: AC
  Administered 2017-12-19: 60 mg via SUBCUTANEOUS

## 2017-12-19 MED ORDER — DENOSUMAB 60 MG/ML ~~LOC~~ SOSY
PREFILLED_SYRINGE | SUBCUTANEOUS | Status: AC
  Filled 2017-12-19: qty 1

## 2018-01-19 ENCOUNTER — Ambulatory Visit (INDEPENDENT_AMBULATORY_CARE_PROVIDER_SITE_OTHER): Payer: Medicare Other

## 2018-01-19 DIAGNOSIS — I495 Sick sinus syndrome: Secondary | ICD-10-CM

## 2018-01-19 DIAGNOSIS — R001 Bradycardia, unspecified: Secondary | ICD-10-CM | POA: Diagnosis not present

## 2018-01-19 LAB — CUP PACEART REMOTE DEVICE CHECK
Battery Remaining Percentage: 95.5 %
Brady Statistic AP VP Percent: 1 %
Brady Statistic AP VS Percent: 78 %
Brady Statistic AS VP Percent: 1 %
Brady Statistic RA Percent Paced: 77 %
Brady Statistic RV Percent Paced: 1 %
Implantable Lead Implant Date: 20160421
Implantable Lead Implant Date: 20160421
Implantable Lead Location: 753859
Implantable Pulse Generator Implant Date: 20160421
Lead Channel Impedance Value: 490 Ohm
Lead Channel Sensing Intrinsic Amplitude: 0.9 mV
MDC IDC LEAD LOCATION: 753860
MDC IDC MSMT BATTERY REMAINING LONGEVITY: 113 mo
MDC IDC MSMT BATTERY VOLTAGE: 3.01 V
MDC IDC MSMT LEADCHNL RA IMPEDANCE VALUE: 360 Ohm
MDC IDC MSMT LEADCHNL RV SENSING INTR AMPL: 12 mV
MDC IDC PG SERIAL: 7761558
MDC IDC SESS DTM: 20191226070026
MDC IDC SET LEADCHNL RA PACING AMPLITUDE: 2 V
MDC IDC SET LEADCHNL RV PACING AMPLITUDE: 2.5 V
MDC IDC SET LEADCHNL RV PACING PULSEWIDTH: 0.5 ms
MDC IDC SET LEADCHNL RV SENSING SENSITIVITY: 2 mV
MDC IDC STAT BRADY AS VS PERCENT: 22 %

## 2018-01-19 NOTE — Progress Notes (Signed)
Remote pacemaker transmission.   

## 2018-02-23 ENCOUNTER — Other Ambulatory Visit: Payer: Self-pay | Admitting: Student

## 2018-02-24 ENCOUNTER — Ambulatory Visit (HOSPITAL_COMMUNITY)
Admission: RE | Admit: 2018-02-24 | Discharge: 2018-02-24 | Disposition: A | Discharge status: Home / Self Care | Payer: Medicare Other | Source: Ambulatory Visit | Attending: Interventional Radiology | Admitting: Interventional Radiology

## 2018-02-24 ENCOUNTER — Encounter (HOSPITAL_COMMUNITY): Payer: Self-pay | Admitting: Diagnostic Radiology

## 2018-02-24 ENCOUNTER — Other Ambulatory Visit (HOSPITAL_COMMUNITY): Payer: Self-pay | Admitting: Diagnostic Radiology

## 2018-02-24 DIAGNOSIS — Z4803 Encounter for change or removal of drains: Secondary | ICD-10-CM | POA: Diagnosis not present

## 2018-02-24 DIAGNOSIS — K831 Obstruction of bile duct: Secondary | ICD-10-CM

## 2018-02-24 HISTORY — PX: IR EXCHANGE BILIARY DRAIN: IMG6046

## 2018-02-24 MED ORDER — LIDOCAINE HCL 1 % IJ SOLN
INTRAMUSCULAR | Status: AC
  Filled 2018-02-24: qty 20

## 2018-02-24 MED ORDER — IOPAMIDOL (ISOVUE-300) INJECTION 61%
50.0000 mL | Freq: Once | INTRAVENOUS | Status: AC | PRN
  Administered 2018-02-24: 15 mL

## 2018-02-24 MED ORDER — IOPAMIDOL (ISOVUE-300) INJECTION 61%
INTRAVENOUS | Status: AC
  Administered 2018-02-24: 15 mL
  Filled 2018-02-24: qty 50

## 2018-02-24 NOTE — Procedures (Signed)
Interventional Radiology Procedure:   Indications: Chronic biliary tube due to biliary stricture  Procedure: Biliary drain exchange  Findings: New 10 Fr drain in place.  Complications: None     EBL: None  Plan: Routine exchanges, keep drain capped.     Cornell Gaber R. Anselm Pancoast, MD  Pager: [phone]

## 2018-04-21 ENCOUNTER — Ambulatory Visit (INDEPENDENT_AMBULATORY_CARE_PROVIDER_SITE_OTHER): Payer: Medicare Other | Admitting: *Deleted

## 2018-04-21 DIAGNOSIS — I495 Sick sinus syndrome: Secondary | ICD-10-CM

## 2018-04-22 ENCOUNTER — Other Ambulatory Visit: Payer: Self-pay

## 2018-04-22 LAB — CUP PACEART REMOTE DEVICE CHECK
Battery Remaining Longevity: 112 mo
Battery Remaining Percentage: 95.5 %
Battery Voltage: 2.99 V
Brady Statistic AP VP Percent: 1 %
Brady Statistic AS VP Percent: 1 %
Brady Statistic AS VS Percent: 25 %
Brady Statistic RA Percent Paced: 74 %
Brady Statistic RV Percent Paced: 1 %
Date Time Interrogation Session: 20200327131805
Implantable Lead Implant Date: 20160421
Implantable Lead Implant Date: 20160421
Implantable Lead Location: 753859
Implantable Lead Location: 753860
Implantable Pulse Generator Implant Date: 20160421
Lead Channel Impedance Value: 360 Ohm
Lead Channel Impedance Value: 460 Ohm
Lead Channel Pacing Threshold Amplitude: 0.75 V
Lead Channel Pacing Threshold Amplitude: 1.25 V
Lead Channel Pacing Threshold Pulse Width: 0.5 ms
Lead Channel Pacing Threshold Pulse Width: 0.5 ms
Lead Channel Sensing Intrinsic Amplitude: 1.5 mV
Lead Channel Sensing Intrinsic Amplitude: 12 mV
Lead Channel Setting Pacing Amplitude: 2 V
Lead Channel Setting Pacing Amplitude: 2.5 V
Lead Channel Setting Pacing Pulse Width: 0.5 ms
Lead Channel Setting Sensing Sensitivity: 2 mV
MDC IDC STAT BRADY AP VS PERCENT: 75 %
Pulse Gen Model: 2240
Pulse Gen Serial Number: 7761558

## 2018-04-24 ENCOUNTER — Encounter: Payer: Self-pay | Admitting: Cardiology

## 2018-04-24 NOTE — Progress Notes (Signed)
Remote pacemaker transmission.   

## 2018-05-11 ENCOUNTER — Encounter: Payer: Medicare Other | Admitting: Internal Medicine

## 2018-05-19 ENCOUNTER — Other Ambulatory Visit (HOSPITAL_COMMUNITY): Payer: Medicare Other

## 2018-06-02 ENCOUNTER — Encounter (HOSPITAL_COMMUNITY): Payer: Self-pay | Admitting: Interventional Radiology

## 2018-06-02 ENCOUNTER — Ambulatory Visit (HOSPITAL_COMMUNITY)
Admission: RE | Admit: 2018-06-02 | Discharge: 2018-06-02 | Disposition: A | Discharge status: Home / Self Care | Payer: Medicare Other | Source: Ambulatory Visit | Attending: Diagnostic Radiology | Admitting: Diagnostic Radiology

## 2018-06-02 ENCOUNTER — Other Ambulatory Visit: Payer: Self-pay

## 2018-06-02 DIAGNOSIS — Z436 Encounter for attention to other artificial openings of urinary tract: Secondary | ICD-10-CM | POA: Insufficient documentation

## 2018-06-02 DIAGNOSIS — K831 Obstruction of bile duct: Secondary | ICD-10-CM | POA: Diagnosis not present

## 2018-06-02 HISTORY — PX: IR EXCHANGE BILIARY DRAIN: IMG6046

## 2018-06-02 MED ORDER — LIDOCAINE HCL 1 % IJ SOLN
INTRAMUSCULAR | Status: AC
  Filled 2018-06-02: qty 20

## 2018-06-02 MED ORDER — IOHEXOL 300 MG/ML  SOLN
50.0000 mL | Freq: Once | INTRAMUSCULAR | Status: AC | PRN
  Administered 2018-06-02: 15:00:00 10 mL

## 2018-06-05 ENCOUNTER — Other Ambulatory Visit (HOSPITAL_COMMUNITY): Payer: Self-pay | Admitting: Interventional Radiology

## 2018-06-05 DIAGNOSIS — K831 Obstruction of bile duct: Secondary | ICD-10-CM

## 2018-06-05 DIAGNOSIS — N183 Chronic kidney disease, stage 3 (moderate): Secondary | ICD-10-CM | POA: Diagnosis not present

## 2018-06-05 DIAGNOSIS — E119 Type 2 diabetes mellitus without complications: Secondary | ICD-10-CM | POA: Diagnosis not present

## 2018-06-05 DIAGNOSIS — E538 Deficiency of other specified B group vitamins: Secondary | ICD-10-CM | POA: Diagnosis not present

## 2018-06-05 DIAGNOSIS — R946 Abnormal results of thyroid function studies: Secondary | ICD-10-CM | POA: Diagnosis not present

## 2018-06-05 DIAGNOSIS — E7849 Other hyperlipidemia: Secondary | ICD-10-CM | POA: Diagnosis not present

## 2018-06-05 DIAGNOSIS — M810 Age-related osteoporosis without current pathological fracture: Secondary | ICD-10-CM | POA: Diagnosis not present

## 2018-06-12 DIAGNOSIS — Z950 Presence of cardiac pacemaker: Secondary | ICD-10-CM | POA: Diagnosis not present

## 2018-06-12 DIAGNOSIS — F4320 Adjustment disorder, unspecified: Secondary | ICD-10-CM | POA: Diagnosis not present

## 2018-06-12 DIAGNOSIS — I4891 Unspecified atrial fibrillation: Secondary | ICD-10-CM | POA: Diagnosis not present

## 2018-06-12 DIAGNOSIS — K831 Obstruction of bile duct: Secondary | ICD-10-CM | POA: Diagnosis not present

## 2018-06-12 DIAGNOSIS — D649 Anemia, unspecified: Secondary | ICD-10-CM | POA: Diagnosis not present

## 2018-06-12 DIAGNOSIS — Z1331 Encounter for screening for depression: Secondary | ICD-10-CM | POA: Diagnosis not present

## 2018-06-12 DIAGNOSIS — E1169 Type 2 diabetes mellitus with other specified complication: Secondary | ICD-10-CM | POA: Diagnosis not present

## 2018-06-12 DIAGNOSIS — R946 Abnormal results of thyroid function studies: Secondary | ICD-10-CM | POA: Diagnosis not present

## 2018-06-12 DIAGNOSIS — Z0000 Encounter for general adult medical examination without abnormal findings: Secondary | ICD-10-CM | POA: Diagnosis not present

## 2018-06-12 DIAGNOSIS — M810 Age-related osteoporosis without current pathological fracture: Secondary | ICD-10-CM | POA: Diagnosis not present

## 2018-06-12 DIAGNOSIS — N183 Chronic kidney disease, stage 3 (moderate): Secondary | ICD-10-CM | POA: Diagnosis not present

## 2018-06-12 DIAGNOSIS — N390 Urinary tract infection, site not specified: Secondary | ICD-10-CM | POA: Diagnosis not present

## 2018-06-29 ENCOUNTER — Other Ambulatory Visit (HOSPITAL_COMMUNITY): Payer: Self-pay | Admitting: *Deleted

## 2018-06-30 ENCOUNTER — Other Ambulatory Visit: Payer: Self-pay

## 2018-06-30 ENCOUNTER — Ambulatory Visit (HOSPITAL_COMMUNITY)
Admission: RE | Admit: 2018-06-30 | Discharge: 2018-06-30 | Disposition: A | Discharge status: Home / Self Care | Payer: Medicare Other | Source: Ambulatory Visit | Attending: Internal Medicine | Admitting: Internal Medicine

## 2018-06-30 DIAGNOSIS — M810 Age-related osteoporosis without current pathological fracture: Secondary | ICD-10-CM | POA: Insufficient documentation

## 2018-06-30 MED ORDER — DENOSUMAB 60 MG/ML ~~LOC~~ SOSY
60.0000 mg | PREFILLED_SYRINGE | Freq: Once | SUBCUTANEOUS | Status: AC
  Administered 2018-06-30: 60 mg via SUBCUTANEOUS

## 2018-06-30 MED ORDER — DENOSUMAB 60 MG/ML ~~LOC~~ SOSY
PREFILLED_SYRINGE | SUBCUTANEOUS | Status: AC
  Administered 2018-06-30: 60 mg via SUBCUTANEOUS
  Filled 2018-06-30: qty 1

## 2018-07-10 ENCOUNTER — Other Ambulatory Visit: Payer: Self-pay | Admitting: Internal Medicine

## 2018-07-20 LAB — CUP PACEART REMOTE DEVICE CHECK
Battery Remaining Longevity: 112 mo
Battery Remaining Percentage: 95.5 %
Battery Voltage: 2.99 V
Brady Statistic AP VP Percent: 1 %
Brady Statistic AP VS Percent: 73 %
Brady Statistic AS VP Percent: 1 %
Brady Statistic AS VS Percent: 27 %
Brady Statistic RA Percent Paced: 72 %
Brady Statistic RV Percent Paced: 1 %
Date Time Interrogation Session: 20200625060015
Implantable Lead Implant Date: 20160421
Implantable Lead Implant Date: 20160421
Implantable Lead Location: 753859
Implantable Lead Location: 753860
Implantable Pulse Generator Implant Date: 20160421
Lead Channel Impedance Value: 340 Ohm
Lead Channel Impedance Value: 460 Ohm
Lead Channel Sensing Intrinsic Amplitude: 0.9 mV
Lead Channel Sensing Intrinsic Amplitude: 12 mV
Lead Channel Setting Pacing Amplitude: 2 V
Lead Channel Setting Pacing Amplitude: 2.5 V
Lead Channel Setting Pacing Pulse Width: 0.5 ms
Lead Channel Setting Sensing Sensitivity: 2 mV
Pulse Gen Model: 2240
Pulse Gen Serial Number: 7761558

## 2018-07-21 ENCOUNTER — Ambulatory Visit (INDEPENDENT_AMBULATORY_CARE_PROVIDER_SITE_OTHER): Payer: Medicare Other | Admitting: *Deleted

## 2018-07-21 DIAGNOSIS — I495 Sick sinus syndrome: Secondary | ICD-10-CM

## 2018-07-26 ENCOUNTER — Encounter: Payer: Self-pay | Admitting: Cardiology

## 2018-07-26 NOTE — Progress Notes (Signed)
Remote pacemaker transmission.   

## 2018-07-28 DIAGNOSIS — H353130 Nonexudative age-related macular degeneration, bilateral, stage unspecified: Secondary | ICD-10-CM | POA: Diagnosis not present

## 2018-07-28 DIAGNOSIS — H43393 Other vitreous opacities, bilateral: Secondary | ICD-10-CM | POA: Diagnosis not present

## 2018-07-28 DIAGNOSIS — Z961 Presence of intraocular lens: Secondary | ICD-10-CM | POA: Diagnosis not present

## 2018-07-28 DIAGNOSIS — D3131 Benign neoplasm of right choroid: Secondary | ICD-10-CM | POA: Diagnosis not present

## 2018-07-28 DIAGNOSIS — H43813 Vitreous degeneration, bilateral: Secondary | ICD-10-CM | POA: Diagnosis not present

## 2018-08-07 DIAGNOSIS — I10 Essential (primary) hypertension: Secondary | ICD-10-CM | POA: Diagnosis not present

## 2018-08-07 DIAGNOSIS — R82998 Other abnormal findings in urine: Secondary | ICD-10-CM | POA: Diagnosis not present

## 2018-08-07 DIAGNOSIS — E038 Other specified hypothyroidism: Secondary | ICD-10-CM | POA: Diagnosis not present

## 2018-08-25 ENCOUNTER — Ambulatory Visit (HOSPITAL_COMMUNITY)
Admission: RE | Admit: 2018-08-25 | Discharge: 2018-08-25 | Disposition: A | Discharge status: Home / Self Care | Payer: Medicare Other | Source: Ambulatory Visit | Attending: Interventional Radiology | Admitting: Interventional Radiology

## 2018-08-25 ENCOUNTER — Other Ambulatory Visit (HOSPITAL_COMMUNITY): Payer: Self-pay | Admitting: Diagnostic Radiology

## 2018-08-25 ENCOUNTER — Encounter (HOSPITAL_COMMUNITY): Payer: Self-pay

## 2018-08-25 ENCOUNTER — Other Ambulatory Visit: Payer: Self-pay

## 2018-08-25 DIAGNOSIS — Z434 Encounter for attention to other artificial openings of digestive tract: Secondary | ICD-10-CM | POA: Diagnosis not present

## 2018-08-25 DIAGNOSIS — K831 Obstruction of bile duct: Secondary | ICD-10-CM | POA: Diagnosis not present

## 2018-08-25 DIAGNOSIS — Z4803 Encounter for change or removal of drains: Secondary | ICD-10-CM | POA: Insufficient documentation

## 2018-08-25 HISTORY — PX: IR EXCHANGE BILIARY DRAIN: IMG6046

## 2018-08-25 MED ORDER — IOHEXOL 300 MG/ML  SOLN
50.0000 mL | Freq: Once | INTRAMUSCULAR | Status: AC | PRN
  Administered 2018-08-25: 5 mL

## 2018-08-25 NOTE — Procedures (Signed)
Interventional Radiology Procedure:   Indications: Biliary stricture and chronic biliary drain.  Routine exchange.  Procedure: Biliary drain exchange  Findings: 10.2 [place] int/ext drain.  Drain capped.   Complications: None     EBL: None  Plan: Plan for routine exchange.   Caitlain Tweed R. Anselm Pancoast, MD  Pager: [phone]

## 2018-09-09 IMAGING — XA IR INT-EXT BILIARY DRAIN W/ CHOLANGIOGRAM
2 series · 7 of 7 positions shown · non-contrast
Comparison: none

INDICATION: History of left hepatic duct stricture at the confluence with
choledocholithiasis. Recurrent left biliary dilatation.

[Series 2: fl - angio · 4 of 126 frames shown]
[frame 19/126]
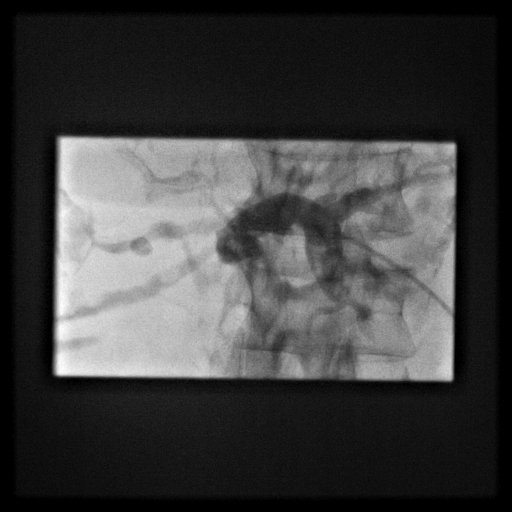
[frame 64/126]
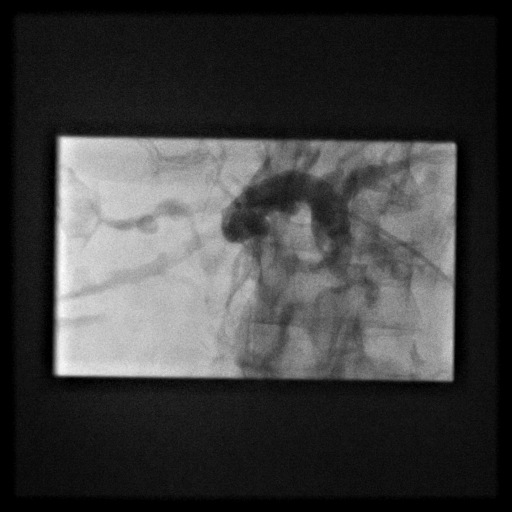
[frame 106/126]
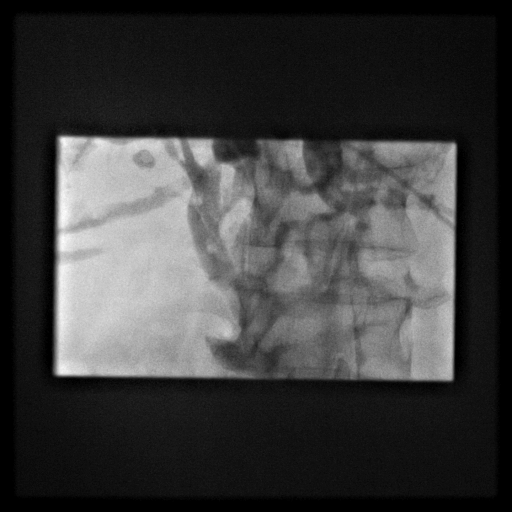
[frame 108/126]
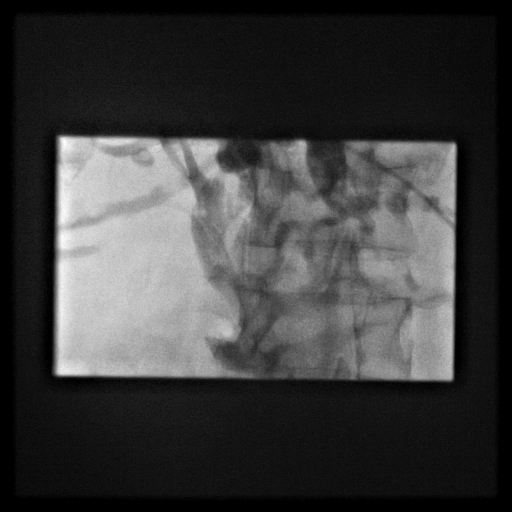

[Series 300: ir biliary drain placement with cholangi · 3 of 3 slices shown]
[im 1/3]
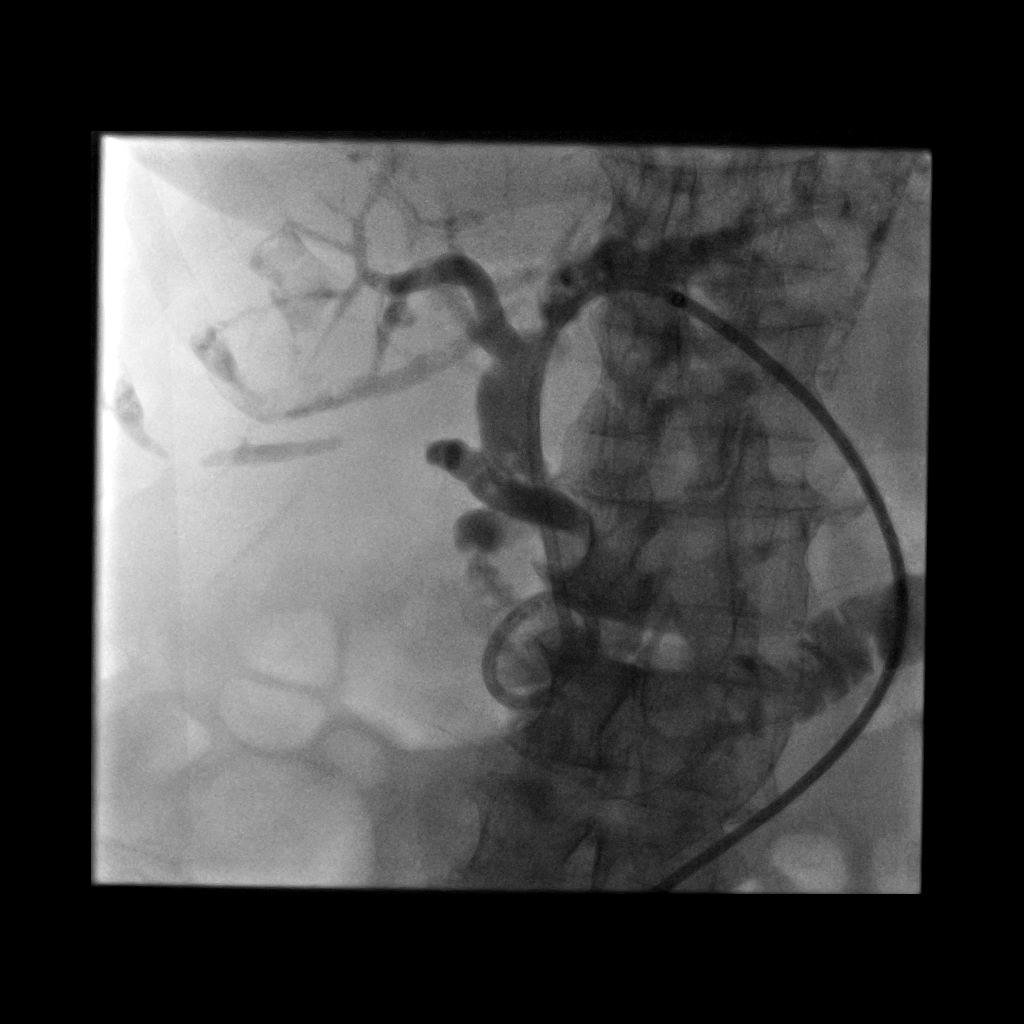
[im 2/3]
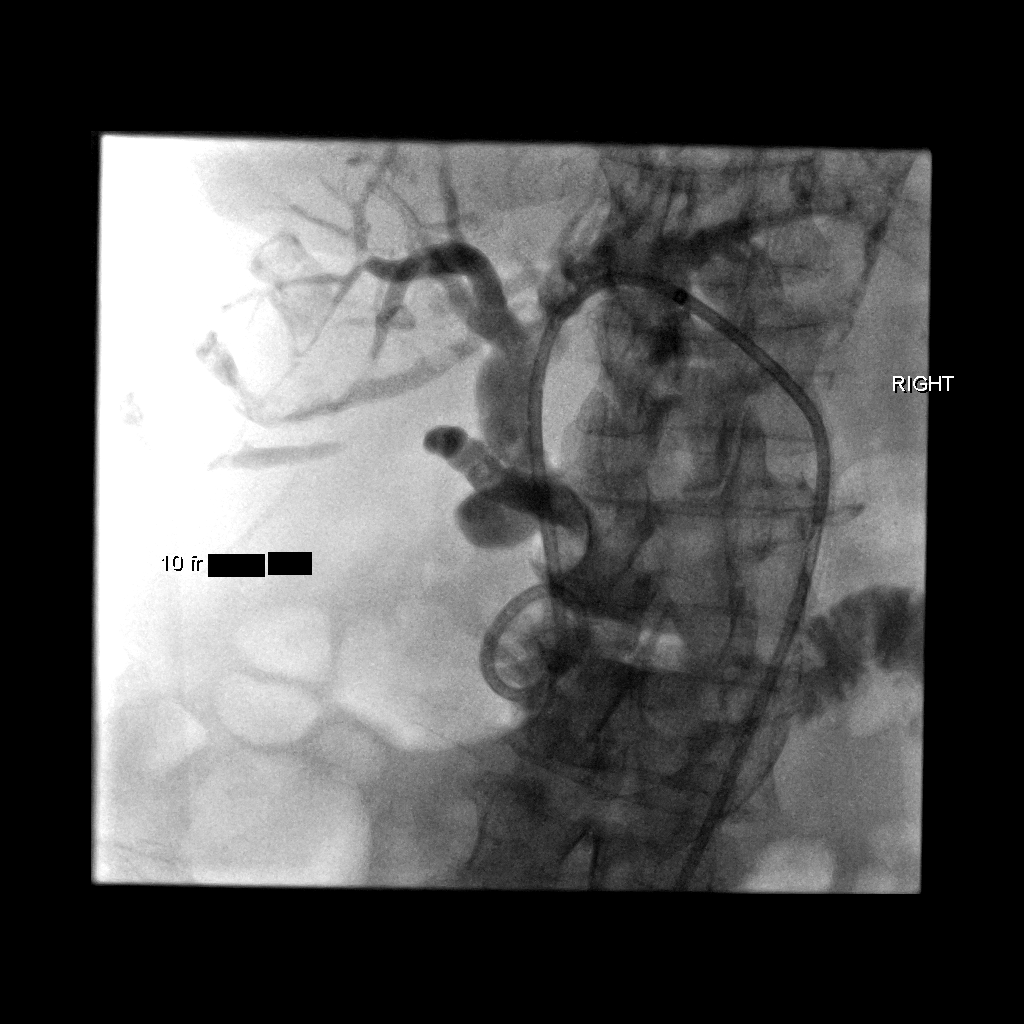
[im 3/3]
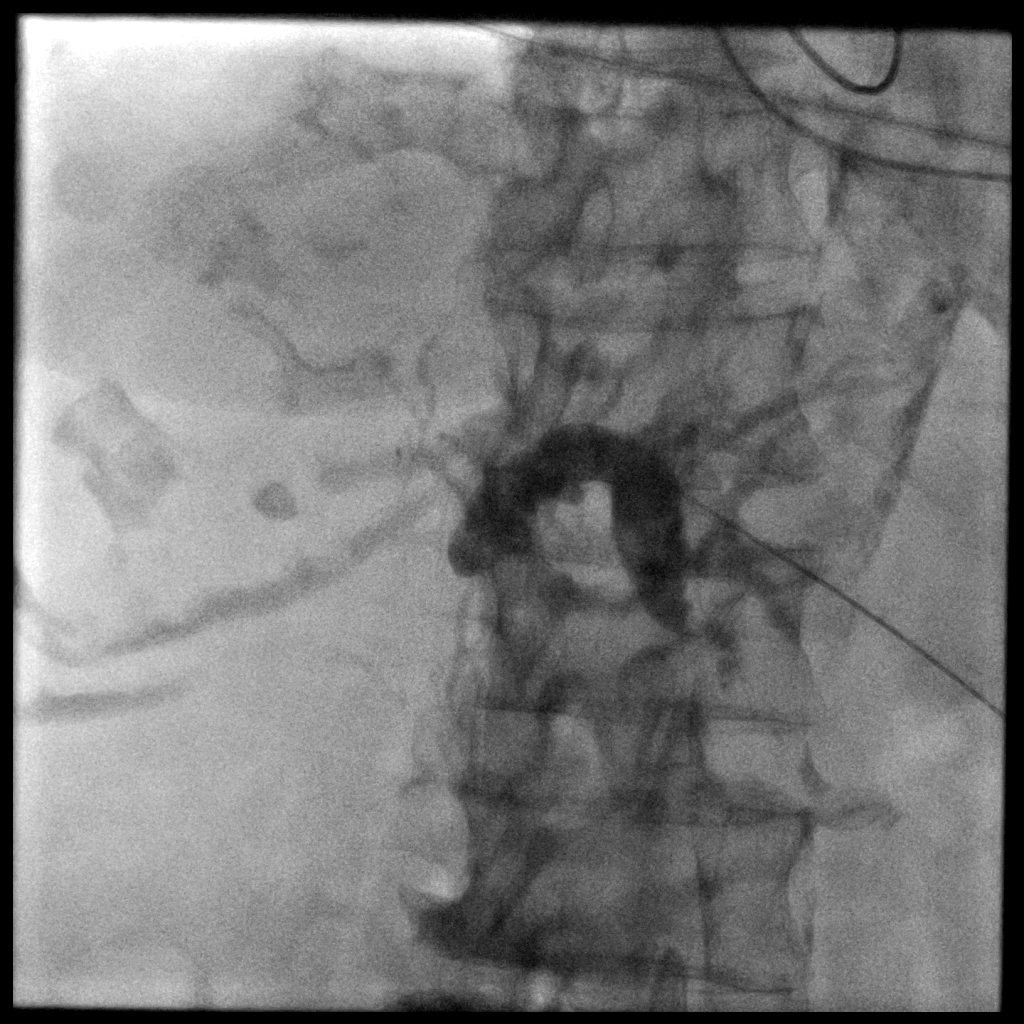

[7 of 7 positions shown; findings below may reference images not displayed]

EXAM:
ULTRASOUND AND FLUOROSCOPIC LEFT INTERNAL EXTERNAL BILIARY DRAIN
INSERTION

MEDICATIONS:
Patient is already receiving Rocephin IV as an inpatient. The
antibiotic was administered within an appropriate time frame prior
to the initiation of the procedure.

ANESTHESIA/SEDATION:
Moderate (conscious) sedation was employed during this procedure. A
total of Versed 1.0 mg and Fentanyl 50 mcg was administered
intravenously.

Moderate Sedation Time: 23 minutes minutes. The patient's level of
consciousness and vital signs were monitored continuously by
radiology nursing throughout the procedure under my direct
supervision.

FLUOROSCOPY TIME:  Fluoroscopy Time: 4 minutes 12 seconds (46 mGy).

COMPLICATIONS:
None immediate.

PROCEDURE:
Informed written consent was obtained from the patient after a
thorough discussion of the procedural risks, benefits and
alternatives. All questions were addressed. Maximal Sterile Barrier
Technique was utilized including caps, mask, sterile gowns, sterile
gloves, sterile drape, hand hygiene and skin antiseptic. A timeout
was performed prior to the initiation of the procedure.

Previous imaging reviewed.

Under sterile conditions and local anesthesia, ultrasound
percutaneous needle access performed of a dilated left hepatic duct
peripherally. There was return of bile. Contrast injection performed
for a limited cholangiogram.

Cholangiogram: Left biliary dilatation noted. Filling defects within
the left hepatic duct compatible with choledocholithiasis. There is
a central left hepatic duct stricture close to the biliary
confluence.

018 guidewire advanced followed by the Accustick dilator set. This
allowed insertion of a 5 French Kumpe catheter. Catheter and Bentson
guidewire utilized to manipulate the access into the duodenum.
Contrast injection confirms position in the duodenum. Over an
Amplatz guidewire, tract dilatation performed to insert a 10 French
internal external biliary drain. Catheter position confirmed with
fluoroscopy and contrast. Images obtained for documentation.

Biliary drain secured with a Prolene suture. Catheter connected to
external gravity drainage. Sterile dressing applied. No immediate
complication.
IMPRESSION: Successful ultrasound and fluoroscopic 10 French left internal
external biliary drain insertion.

## 2018-09-15 ENCOUNTER — Other Ambulatory Visit: Payer: Self-pay

## 2018-09-15 ENCOUNTER — Encounter: Payer: Self-pay | Admitting: Internal Medicine

## 2018-09-15 ENCOUNTER — Ambulatory Visit (INDEPENDENT_AMBULATORY_CARE_PROVIDER_SITE_OTHER): Payer: Medicare Other | Admitting: Internal Medicine

## 2018-09-15 VITALS — BP 152/64 | HR 62 | Ht 64.0 in | Wt 126.0 lb

## 2018-09-15 DIAGNOSIS — Z950 Presence of cardiac pacemaker: Secondary | ICD-10-CM

## 2018-09-15 DIAGNOSIS — I480 Paroxysmal atrial fibrillation: Secondary | ICD-10-CM

## 2018-09-15 DIAGNOSIS — I495 Sick sinus syndrome: Secondary | ICD-10-CM

## 2018-09-15 DIAGNOSIS — R001 Bradycardia, unspecified: Secondary | ICD-10-CM | POA: Diagnosis not present

## 2018-09-15 LAB — CUP PACEART INCLINIC DEVICE CHECK
Battery Remaining Longevity: 123 mo
Battery Voltage: 2.99 V
Brady Statistic RA Percent Paced: 72 %
Brady Statistic RV Percent Paced: 0.24 %
Date Time Interrogation Session: 20200821191526
Implantable Lead Implant Date: 20160421
Implantable Lead Implant Date: 20160421
Implantable Lead Location: 753859
Implantable Lead Location: 753860
Implantable Pulse Generator Implant Date: 20160421
Lead Channel Impedance Value: 362.5 Ohm
Lead Channel Impedance Value: 462.5 Ohm
Lead Channel Pacing Threshold Amplitude: 0.5 V
Lead Channel Pacing Threshold Amplitude: 1.25 V
Lead Channel Pacing Threshold Pulse Width: 0.5 ms
Lead Channel Pacing Threshold Pulse Width: 0.5 ms
Lead Channel Sensing Intrinsic Amplitude: 0.7 mV
Lead Channel Sensing Intrinsic Amplitude: 12 mV
Lead Channel Setting Pacing Amplitude: 2 V
Lead Channel Setting Pacing Amplitude: 2.5 V
Lead Channel Setting Pacing Pulse Width: 0.5 ms
Lead Channel Setting Sensing Sensitivity: 2 mV
Pulse Gen Model: 2240
Pulse Gen Serial Number: 7761558

## 2018-09-15 NOTE — Progress Notes (Signed)
HPI [name] returns today for followup. She is a pleasant 85yo woman with symptomatic bradycardia, PAF, HTN, who underwent PPM insertion 4 years ago. She returns today for followup. She has no chest pain or sob. No syncope. She hasno specific complaints today. No fever or chills. Her appetite is good. No Known Allergies   Current Outpatient Medications  Medication Sig Dispense Refill  . acetaminophen (TYLENOL) 500 MG tablet Take 500 mg by mouth every 6 (six) hours as needed for mild pain.    Marland Kitchen atorvastatin (LIPITOR) 20 MG tablet Take 1 tablet (20 mg total) by mouth daily at 6 PM. Please schedule an appt 1st attempt 90 tablet 0  . Calcium Carbonate-Vitamin D (CALCIUM 600/VITAMIN D PO) Take 2 tablets by mouth daily.    . Cyanocobalamin (NASCOBAL NA) Place 1 spray into both nostrils once a week. saturday    . denosumab (PROLIA) 60 MG/ML SOLN injection Inject 60 mg into the skin every 6 (six) months. Administer in upper arm, thigh, or abdomen    . diltiazem (CARDIZEM CD) 120 MG 24 hr capsule Take 1 capsule (120 mg total) by mouth daily. Please schedule appt for 1st attempt 90 capsule 0  . ELIQUIS 2.5 MG TABS tablet TAKE 1 TABLET BY MOUTH TWICE A DAY 60 tablet 4  . Ergocalciferol (VITAMIN D2 PO) Take 1.25 mg by mouth once a week.    . ferrous sulfate 325 (65 FE) MG tablet Take 325 mg by mouth daily with breakfast.    . Fexofenadine HCl (MUCINEX ALLERGY PO) Take by mouth.    . hydrochlorothiazide (HYDRODIURIL) 25 MG tablet Take 12.5 mg by mouth daily.    Marland Kitchen levothyroxine (SYNTHROID) 25 MCG tablet Take 25 mcg by mouth every morning.    . loratadine (CLARITIN) 10 MG tablet Take 10 mg by mouth daily.    Marland Kitchen LORazepam (ATIVAN) 1 MG tablet Take 1 mg by mouth at bedtime.    . metoprolol (LOPRESSOR) 50 MG tablet Take 50 mg by mouth 2 (two) times daily.    . mometasone (NASONEX) 50 MCG/ACT nasal spray Place 2 sprays into both nostrils daily.    . Multiple Vitamins-Minerals (MULTIVITAMIN WITH  MINERALS) tablet Take 1 tablet by mouth daily.    . Omega-3 Fatty Acids (FISH OIL PO) Take 12 mg by mouth daily.    Vladimir Faster Glycol-Propyl Glycol 0.4-0.3 % SOLN Apply 1 drop to eye daily as needed (dryness).    . sucralfate (CARAFATE) 1 G tablet Take 1 g by mouth 4 (four) times daily -  before meals and at bedtime.    . traMADol (ULTRAM) 50 MG tablet Take 50 mg by mouth 2 (two) times daily.      No current facility-administered medications for this visit.      Past Medical History:  Diagnosis Date  . Allergic rhinitis   . Anemia   . Atrial fibrillation ([place])    a. on Eliquis  . Biliary obstruction   . Chronic kidney disease (CKD), stage II (mild)   . DDD (degenerative disc disease), lumbar   . Diabetes ([place])   . DJD (degenerative joint disease)   . GERD (gastroesophageal reflux disease)   . Hyperlipidemia   . Hypertension   . Macular degeneration   . Osteoporosis   . 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 Tachy-brady syndrome ([facility])    a. s/p STJ dual chamber PPM implant  . Vitamin B 12 deficiency   . Vitamin  D deficiency   . Whooping cough     ROS:   All systems reviewed and negative except as noted in the HPI.   Past Surgical History:  Procedure Laterality Date  . BACK SURGERY    . CHOLECYSTECTOMY    . IR EXCHANGE BILIARY DRAIN  06/22/2016  . IR EXCHANGE BILIARY DRAIN  07/30/2016  . IR EXCHANGE BILIARY DRAIN  10/01/2016  . IR EXCHANGE BILIARY DRAIN  12/02/2016  . IR EXCHANGE BILIARY DRAIN  12/24/2016  . IR EXCHANGE BILIARY DRAIN  03/17/2017  . IR EXCHANGE BILIARY DRAIN  06/09/2017  . IR EXCHANGE BILIARY DRAIN  09/09/2017  . IR EXCHANGE BILIARY DRAIN  12/02/2017  . IR EXCHANGE BILIARY DRAIN  02/24/2018  . IR EXCHANGE BILIARY DRAIN  06/02/2018  . IR EXCHANGE BILIARY DRAIN  08/25/2018  . IR INT EXT BILIARY DRAIN WITH CHOLANGIOGRAM  06/04/2016  . PARTIAL GASTRECTOMY  1992  . PARTIAL HYSTERECTOMY  1968  . PERMANENT PACEMAKER INSERTION N/A 05/16/2014   STJ Assurity dual chamber  pacemaker implanted by Dr Lovena Le  . TOTAL ABDOMINAL HYSTERECTOMY  1991     Family History  Problem Relation Age of Onset  . Heart disease Father        Dropsy  . Pneumonia Mother   . Sudden death Daughter 33  . Heart attack Son 93  . Colon cancer Neg Hx   . Esophageal cancer Neg Hx   . Pancreatic cancer Neg Hx   . Stomach cancer Neg Hx      Social History   Socioeconomic History  . Marital status: Married    Spouse name: Not on file  . Number of children: 4  . Years of education: Not on file  . Highest education level: Not on file  Occupational History  . Not on file  Social Needs  . Financial resource strain: Not on file  . Food insecurity    Worry: Not on file    Inability: Not on file  . Transportation needs    Medical: Not on file    Non-medical: Not on file  Tobacco Use  . Smoking status: Never Smoker  . Smokeless tobacco: Never Used  Substance and Sexual Activity  . Alcohol use: No  . Drug use: No  . Sexual activity: Not on file  Lifestyle  . Physical activity    Days per week: Not on file    Minutes per session: Not on file  . Stress: Not on file  Relationships  . Social Herbalist on phone: Not on file    Gets together: Not on file    Attends religious service: Not on file    Active member of club or organization: Not on file    Attends meetings of clubs or organizations: Not on file    Relationship status: Not on file  . Intimate partner violence    Fear of current or ex partner: Not on file    Emotionally abused: Not on file    Physically abused: Not on file    Forced sexual activity: Not on file  Other Topics Concern  . Not on file  Social History Narrative   Lives alone.       BP (!) 152/64   Pulse 62   Ht 5\' 4"  (1.626 m)   Wt 126 lb (57.2 kg)   SpO2 (!) 83%   BMI 21.63 kg/m   Physical Exam:  Well appearing 85 yo woman, NAD HEENT: Unremarkable  Neck:  No JVD, no thyromegally Lymphatics:  No adenopathy Back:  No  CVA tenderness Lungs:  Clear with no wheezes HEART:  Regular rate rhythm, no murmurs, no rubs, no clicks Abd:  soft, positive bowel sounds, no organomegally, no rebound, no guarding Ext:  2 plus pulses, no edema, no cyanosis, no clubbing Skin:  No rashes no nodules Neuro:  CN II through XII intact, motor grossly intact  EKG - NSR with atrial pacing  DEVICE  Normal device function.  See PaceArt for details.   Assess/Plan: 1. Sinus node dysfunction - she is asymptomatic, s/p PPM insertion. 2. HTN - her bp is up a bit today but better than previous visits. I encouraged her to avoid salty foods.  3. PPM - her [employer] Jude DDD PM is working normally. We will recheck in several months.   Mikle Bosworth.D.

## 2018-09-15 NOTE — Patient Instructions (Signed)
Your physician recommends that you continue on your current medications as directed. Please refer to the Current Medication list given to you today.   Your physician wants you to follow-up in: YEAR WITH DR TAYLOR  You will receive a reminder letter in the mail two months in advance. If you don't receive a letter, please call our office to schedule the follow-up appointment.  

## 2018-10-03 ENCOUNTER — Other Ambulatory Visit: Payer: Self-pay | Admitting: Internal Medicine

## 2018-10-11 DIAGNOSIS — I4891 Unspecified atrial fibrillation: Secondary | ICD-10-CM | POA: Diagnosis not present

## 2018-10-11 DIAGNOSIS — E039 Hypothyroidism, unspecified: Secondary | ICD-10-CM | POA: Diagnosis not present

## 2018-10-11 DIAGNOSIS — D649 Anemia, unspecified: Secondary | ICD-10-CM | POA: Diagnosis not present

## 2018-10-11 DIAGNOSIS — I129 Hypertensive chronic kidney disease with stage 1 through stage 4 chronic kidney disease, or unspecified chronic kidney disease: Secondary | ICD-10-CM | POA: Diagnosis not present

## 2018-10-11 DIAGNOSIS — N183 Chronic kidney disease, stage 3 (moderate): Secondary | ICD-10-CM | POA: Diagnosis not present

## 2018-10-11 DIAGNOSIS — E538 Deficiency of other specified B group vitamins: Secondary | ICD-10-CM | POA: Diagnosis not present

## 2018-10-11 DIAGNOSIS — E1169 Type 2 diabetes mellitus with other specified complication: Secondary | ICD-10-CM | POA: Diagnosis not present

## 2018-10-11 DIAGNOSIS — Z950 Presence of cardiac pacemaker: Secondary | ICD-10-CM | POA: Diagnosis not present

## 2018-10-11 DIAGNOSIS — I4892 Unspecified atrial flutter: Secondary | ICD-10-CM | POA: Diagnosis not present

## 2018-10-11 DIAGNOSIS — M810 Age-related osteoporosis without current pathological fracture: Secondary | ICD-10-CM | POA: Diagnosis not present

## 2018-10-20 ENCOUNTER — Ambulatory Visit (INDEPENDENT_AMBULATORY_CARE_PROVIDER_SITE_OTHER): Payer: Medicare Other | Admitting: *Deleted

## 2018-10-20 DIAGNOSIS — I495 Sick sinus syndrome: Secondary | ICD-10-CM | POA: Diagnosis not present

## 2018-10-20 LAB — CUP PACEART REMOTE DEVICE CHECK
Battery Remaining Longevity: 113 mo
Battery Remaining Percentage: 95.5 %
Battery Voltage: 2.99 V
Brady Statistic AP VP Percent: 1 %
Brady Statistic AP VS Percent: 76 %
Brady Statistic AS VP Percent: 1 %
Brady Statistic AS VS Percent: 24 %
Brady Statistic RA Percent Paced: 76 %
Brady Statistic RV Percent Paced: 1 %
Date Time Interrogation Session: 20200924060015
Implantable Lead Implant Date: 20160421
Implantable Lead Implant Date: 20160421
Implantable Lead Location: 753859
Implantable Lead Location: 753860
Implantable Pulse Generator Implant Date: 20160421
Lead Channel Impedance Value: 360 Ohm
Lead Channel Impedance Value: 460 Ohm
Lead Channel Pacing Threshold Amplitude: 0.5 V
Lead Channel Pacing Threshold Amplitude: 1.25 V
Lead Channel Pacing Threshold Pulse Width: 0.5 ms
Lead Channel Pacing Threshold Pulse Width: 0.5 ms
Lead Channel Sensing Intrinsic Amplitude: 0.9 mV
Lead Channel Sensing Intrinsic Amplitude: 12 mV
Lead Channel Setting Pacing Amplitude: 2 V
Lead Channel Setting Pacing Amplitude: 2.5 V
Lead Channel Setting Pacing Pulse Width: 0.5 ms
Lead Channel Setting Sensing Sensitivity: 2 mV
Pulse Gen Model: 2240
Pulse Gen Serial Number: 7761558

## 2018-10-24 ENCOUNTER — Encounter: Payer: Self-pay | Admitting: Cardiology

## 2018-10-24 NOTE — Progress Notes (Signed)
Remote pacemaker transmission.   

## 2018-10-28 DIAGNOSIS — Z23 Encounter for immunization: Secondary | ICD-10-CM | POA: Diagnosis not present

## 2018-11-17 ENCOUNTER — Other Ambulatory Visit (HOSPITAL_COMMUNITY): Payer: Medicare Other

## 2018-11-21 ENCOUNTER — Other Ambulatory Visit: Payer: Self-pay

## 2018-11-21 ENCOUNTER — Encounter (HOSPITAL_COMMUNITY): Payer: Self-pay | Admitting: Interventional Radiology

## 2018-11-21 ENCOUNTER — Other Ambulatory Visit (HOSPITAL_COMMUNITY): Payer: Self-pay | Admitting: Interventional Radiology

## 2018-11-21 ENCOUNTER — Ambulatory Visit (HOSPITAL_COMMUNITY)
Admission: RE | Admit: 2018-11-21 | Discharge: 2018-11-21 | Disposition: A | Discharge status: Home / Self Care | Payer: Medicare Other | Source: Ambulatory Visit | Attending: Diagnostic Radiology | Admitting: Diagnostic Radiology

## 2018-11-21 ENCOUNTER — Other Ambulatory Visit (HOSPITAL_COMMUNITY): Payer: Self-pay | Admitting: Diagnostic Radiology

## 2018-11-21 DIAGNOSIS — Z434 Encounter for attention to other artificial openings of digestive tract: Secondary | ICD-10-CM | POA: Diagnosis not present

## 2018-11-21 DIAGNOSIS — Z4803 Encounter for change or removal of drains: Secondary | ICD-10-CM | POA: Insufficient documentation

## 2018-11-21 DIAGNOSIS — K831 Obstruction of bile duct: Secondary | ICD-10-CM | POA: Diagnosis not present

## 2018-11-21 HISTORY — PX: IR EXCHANGE BILIARY DRAIN: IMG6046

## 2018-11-21 MED ORDER — IOHEXOL 300 MG/ML  SOLN
50.0000 mL | Freq: Once | INTRAMUSCULAR | Status: AC | PRN
  Administered 2018-11-21: 8 mL

## 2019-01-22 ENCOUNTER — Ambulatory Visit (INDEPENDENT_AMBULATORY_CARE_PROVIDER_SITE_OTHER): Payer: Medicare Other | Admitting: *Deleted

## 2019-01-22 DIAGNOSIS — I495 Sick sinus syndrome: Secondary | ICD-10-CM

## 2019-01-22 LAB — CUP PACEART REMOTE DEVICE CHECK
Battery Remaining Longevity: 115 mo
Battery Remaining Percentage: 95.5 %
Battery Voltage: 2.99 V
Brady Statistic AP VP Percent: 1 %
Brady Statistic AP VS Percent: 71 %
Brady Statistic AS VP Percent: 1 %
Brady Statistic AS VS Percent: 29 %
Brady Statistic RA Percent Paced: 70 %
Brady Statistic RV Percent Paced: 1 %
Date Time Interrogation Session: 20201228020013
Implantable Lead Implant Date: 20160421
Implantable Lead Implant Date: 20160421
Implantable Lead Location: 753859
Implantable Lead Location: 753860
Implantable Pulse Generator Implant Date: 20160421
Lead Channel Impedance Value: 360 Ohm
Lead Channel Impedance Value: 540 Ohm
Lead Channel Pacing Threshold Amplitude: 0.5 V
Lead Channel Pacing Threshold Amplitude: 1.25 V
Lead Channel Pacing Threshold Pulse Width: 0.5 ms
Lead Channel Pacing Threshold Pulse Width: 0.5 ms
Lead Channel Sensing Intrinsic Amplitude: 1.4 mV
Lead Channel Sensing Intrinsic Amplitude: 12 mV
Lead Channel Setting Pacing Amplitude: 2 V
Lead Channel Setting Pacing Amplitude: 2.5 V
Lead Channel Setting Pacing Pulse Width: 0.5 ms
Lead Channel Setting Sensing Sensitivity: 2 mV
Pulse Gen Model: 2240
Pulse Gen Serial Number: 7761558

## 2019-02-09 DIAGNOSIS — N1831 Chronic kidney disease, stage 3a: Secondary | ICD-10-CM | POA: Diagnosis not present

## 2019-02-09 DIAGNOSIS — I4891 Unspecified atrial fibrillation: Secondary | ICD-10-CM | POA: Diagnosis not present

## 2019-02-09 DIAGNOSIS — E1169 Type 2 diabetes mellitus with other specified complication: Secondary | ICD-10-CM | POA: Diagnosis not present

## 2019-02-09 DIAGNOSIS — M810 Age-related osteoporosis without current pathological fracture: Secondary | ICD-10-CM | POA: Diagnosis not present

## 2019-02-09 DIAGNOSIS — K831 Obstruction of bile duct: Secondary | ICD-10-CM | POA: Diagnosis not present

## 2019-02-09 DIAGNOSIS — I129 Hypertensive chronic kidney disease with stage 1 through stage 4 chronic kidney disease, or unspecified chronic kidney disease: Secondary | ICD-10-CM | POA: Diagnosis not present

## 2019-02-09 DIAGNOSIS — E538 Deficiency of other specified B group vitamins: Secondary | ICD-10-CM | POA: Diagnosis not present

## 2019-02-09 DIAGNOSIS — D649 Anemia, unspecified: Secondary | ICD-10-CM | POA: Diagnosis not present

## 2019-02-09 DIAGNOSIS — R7301 Impaired fasting glucose: Secondary | ICD-10-CM | POA: Diagnosis not present

## 2019-02-09 DIAGNOSIS — Z950 Presence of cardiac pacemaker: Secondary | ICD-10-CM | POA: Diagnosis not present

## 2019-02-09 DIAGNOSIS — R2689 Other abnormalities of gait and mobility: Secondary | ICD-10-CM | POA: Diagnosis not present

## 2019-02-09 DIAGNOSIS — E039 Hypothyroidism, unspecified: Secondary | ICD-10-CM | POA: Diagnosis not present

## 2019-02-13 ENCOUNTER — Other Ambulatory Visit: Payer: Self-pay

## 2019-02-13 ENCOUNTER — Ambulatory Visit (HOSPITAL_COMMUNITY)
Admission: RE | Admit: 2019-02-13 | Discharge: 2019-02-13 | Disposition: A | Discharge status: Home / Self Care | Payer: Medicare Other | Source: Ambulatory Visit | Attending: Interventional Radiology | Admitting: Interventional Radiology

## 2019-02-13 ENCOUNTER — Other Ambulatory Visit (HOSPITAL_COMMUNITY): Payer: Self-pay | Admitting: Interventional Radiology

## 2019-02-13 DIAGNOSIS — K831 Obstruction of bile duct: Secondary | ICD-10-CM

## 2019-02-13 DIAGNOSIS — E7849 Other hyperlipidemia: Secondary | ICD-10-CM | POA: Diagnosis not present

## 2019-02-13 DIAGNOSIS — Z4803 Encounter for change or removal of drains: Secondary | ICD-10-CM | POA: Diagnosis not present

## 2019-02-13 DIAGNOSIS — E538 Deficiency of other specified B group vitamins: Secondary | ICD-10-CM | POA: Diagnosis not present

## 2019-02-13 DIAGNOSIS — D649 Anemia, unspecified: Secondary | ICD-10-CM | POA: Diagnosis not present

## 2019-02-13 DIAGNOSIS — Z434 Encounter for attention to other artificial openings of digestive tract: Secondary | ICD-10-CM | POA: Diagnosis not present

## 2019-02-13 DIAGNOSIS — E038 Other specified hypothyroidism: Secondary | ICD-10-CM | POA: Diagnosis not present

## 2019-02-13 DIAGNOSIS — E119 Type 2 diabetes mellitus without complications: Secondary | ICD-10-CM | POA: Diagnosis not present

## 2019-02-13 HISTORY — PX: IR EXCHANGE BILIARY DRAIN: IMG6046

## 2019-02-13 MED ORDER — IOHEXOL 300 MG/ML  SOLN
50.0000 mL | Freq: Once | INTRAMUSCULAR | Status: AC | PRN
  Administered 2019-02-13: 6 mL

## 2019-02-13 MED ORDER — LIDOCAINE HCL 1 % IJ SOLN
INTRAMUSCULAR | Status: AC
  Filled 2019-02-13: qty 20

## 2019-02-13 NOTE — Procedures (Signed)
Chronic biliary stricture with capped drain  S/p fluoro exchg chronic left 25fr int ext drain  No comp Stable ebl 0 Left to external cap Full report in pacs

## 2019-02-19 DIAGNOSIS — L97329 Non-pressure chronic ulcer of left ankle with unspecified severity: Secondary | ICD-10-CM | POA: Diagnosis not present

## 2019-02-19 DIAGNOSIS — B351 Tinea unguium: Secondary | ICD-10-CM | POA: Diagnosis not present

## 2019-02-19 DIAGNOSIS — M6281 Muscle weakness (generalized): Secondary | ICD-10-CM | POA: Diagnosis not present

## 2019-02-19 DIAGNOSIS — I739 Peripheral vascular disease, unspecified: Secondary | ICD-10-CM | POA: Diagnosis not present

## 2019-03-05 DIAGNOSIS — L97329 Non-pressure chronic ulcer of left ankle with unspecified severity: Secondary | ICD-10-CM | POA: Diagnosis not present

## 2019-03-05 DIAGNOSIS — I739 Peripheral vascular disease, unspecified: Secondary | ICD-10-CM | POA: Diagnosis not present

## 2019-03-19 DIAGNOSIS — L97329 Non-pressure chronic ulcer of left ankle with unspecified severity: Secondary | ICD-10-CM | POA: Diagnosis not present

## 2019-03-19 DIAGNOSIS — I739 Peripheral vascular disease, unspecified: Secondary | ICD-10-CM | POA: Diagnosis not present

## 2019-03-29 DIAGNOSIS — I70203 Unspecified atherosclerosis of native arteries of extremities, bilateral legs: Secondary | ICD-10-CM | POA: Diagnosis not present

## 2019-03-29 DIAGNOSIS — I70223 Atherosclerosis of native arteries of extremities with rest pain, bilateral legs: Secondary | ICD-10-CM | POA: Diagnosis not present

## 2019-03-29 DIAGNOSIS — L97329 Non-pressure chronic ulcer of left ankle with unspecified severity: Secondary | ICD-10-CM | POA: Diagnosis not present

## 2019-03-29 DIAGNOSIS — I739 Peripheral vascular disease, unspecified: Secondary | ICD-10-CM | POA: Diagnosis not present

## 2019-04-05 ENCOUNTER — Other Ambulatory Visit: Payer: Self-pay | Admitting: *Deleted

## 2019-04-05 DIAGNOSIS — I739 Peripheral vascular disease, unspecified: Secondary | ICD-10-CM

## 2019-04-05 DIAGNOSIS — I70233 Atherosclerosis of native arteries of right leg with ulceration of ankle: Secondary | ICD-10-CM

## 2019-04-06 ENCOUNTER — Other Ambulatory Visit: Payer: Self-pay

## 2019-04-06 ENCOUNTER — Ambulatory Visit (INDEPENDENT_AMBULATORY_CARE_PROVIDER_SITE_OTHER): Payer: Medicare Other | Admitting: Vascular Surgery

## 2019-04-06 ENCOUNTER — Encounter: Payer: Self-pay | Admitting: Vascular Surgery

## 2019-04-06 ENCOUNTER — Ambulatory Visit (HOSPITAL_COMMUNITY)
Admission: RE | Admit: 2019-04-06 | Discharge: 2019-04-06 | Disposition: A | Discharge status: Home / Self Care | Payer: Medicare Other | Source: Ambulatory Visit | Attending: Vascular Surgery | Admitting: Vascular Surgery

## 2019-04-06 VITALS — BP 142/68 | HR 64 | Temp 97.3°F | Resp 18 | Ht 64.0 in | Wt 126.0 lb

## 2019-04-06 DIAGNOSIS — I70233 Atherosclerosis of native arteries of right leg with ulceration of ankle: Secondary | ICD-10-CM | POA: Insufficient documentation

## 2019-04-06 DIAGNOSIS — I739 Peripheral vascular disease, unspecified: Secondary | ICD-10-CM | POA: Insufficient documentation

## 2019-04-06 NOTE — Progress Notes (Signed)
Patient ID: [name] [name], female   DOB: 1917/09/06, 86 y.o.   MRN: [id number]  Reason for Consult: Follow-up   Referred by Crist Infante, MD  Subjective:     HPI:  [name] [name] is a 86 y.o. female left lateral ankle ulcer.  She is on Eliquis for atrial fibrillation.  Ulceration has been present for several months.  She is scheduled to see the wound care center next week.  She has never had lower extremity arterial venous issues.  She does have swelling in the left lower extremity since the wound has been present.  Risk factors for vascular disease include diabetes, hyperlipidemia, hypertension.  Denies any history of DVT.  Past Medical History:  Diagnosis Date  . Allergic rhinitis   . Anemia   . Atrial fibrillation ([place])    a. on Eliquis  . Biliary obstruction   . Chronic kidney disease (CKD), stage II (mild)   . DDD (degenerative disc disease), lumbar   . Diabetes ([place])   . DJD (degenerative joint disease)   . GERD (gastroesophageal reflux disease)   . Hyperlipidemia   . Hypertension   . Macular degeneration   . Osteoporosis   . Stomach cancer ([place])    1992  . Tachy-brady syndrome ([facility])    a. s/p STJ dual chamber PPM implant  . Vitamin B 12 deficiency   . Vitamin D deficiency   . Whooping cough    Family History  Problem Relation Age of Onset  . Heart disease Father        Dropsy  . Pneumonia Mother   . Sudden death Daughter 41  . Heart attack Son 78  . Colon cancer Neg Hx   . Esophageal cancer Neg Hx   . Pancreatic cancer Neg Hx   . Stomach cancer Neg Hx    Past Surgical History:  Procedure Laterality Date  . BACK SURGERY    . CHOLECYSTECTOMY    . IR EXCHANGE BILIARY DRAIN  06/22/2016  . IR EXCHANGE BILIARY DRAIN  07/30/2016  . IR EXCHANGE BILIARY DRAIN  10/01/2016  . IR EXCHANGE BILIARY DRAIN  12/02/2016  . IR EXCHANGE BILIARY DRAIN  12/24/2016  . IR EXCHANGE BILIARY DRAIN  03/17/2017  . IR EXCHANGE BILIARY DRAIN  06/09/2017  . IR EXCHANGE BILIARY DRAIN   09/09/2017  . IR EXCHANGE BILIARY DRAIN  12/02/2017  . IR EXCHANGE BILIARY DRAIN  02/24/2018  . IR EXCHANGE BILIARY DRAIN  06/02/2018  . IR EXCHANGE BILIARY DRAIN  08/25/2018  . IR EXCHANGE BILIARY DRAIN  11/21/2018  . IR EXCHANGE BILIARY DRAIN  02/13/2019  . IR INT EXT BILIARY DRAIN WITH CHOLANGIOGRAM  06/04/2016  . PARTIAL GASTRECTOMY  1992  . PARTIAL HYSTERECTOMY  1968  . PERMANENT PACEMAKER INSERTION N/A 05/16/2014   STJ Assurity dual chamber pacemaker implanted by Dr Lovena Le  . TOTAL ABDOMINAL HYSTERECTOMY  1991    Short Social History:  Social History   Tobacco Use  . Smoking status: Never Smoker  . Smokeless tobacco: Never Used  Substance Use Topics  . Alcohol use: No    No Known Allergies  Current Outpatient Medications  Medication Sig Dispense Refill  . acetaminophen (TYLENOL) 500 MG tablet Take 500 mg by mouth every 6 (six) hours as needed for mild pain.    Marland Kitchen atorvastatin (LIPITOR) 20 MG tablet Take 1 tablet (20 mg total) by mouth daily at 6 PM. 90 tablet 3  . Calcium Carbonate-Vitamin D (CALCIUM 600/VITAMIN D PO) Take  2 tablets by mouth daily.    . Cyanocobalamin (NASCOBAL NA) Place 1 spray into both nostrils once a week. saturday    . denosumab (PROLIA) 60 MG/ML SOLN injection Inject 60 mg into the skin every 6 (six) months. Administer in upper arm, thigh, or abdomen    . diltiazem (CARDIZEM CD) 120 MG 24 hr capsule Take 1 capsule (120 mg total) by mouth daily. 90 capsule 3  . ELIQUIS 2.5 MG TABS tablet TAKE 1 TABLET BY MOUTH TWICE A DAY 60 tablet 4  . Ergocalciferol (VITAMIN D2 PO) Take 1.25 mg by mouth once a week.    . ferrous sulfate 325 (65 FE) MG tablet Take 325 mg by mouth daily with breakfast.    . Fexofenadine HCl (MUCINEX ALLERGY PO) Take by mouth.    . hydrochlorothiazide (HYDRODIURIL) 25 MG tablet Take 12.5 mg by mouth daily.    Marland Kitchen levothyroxine (SYNTHROID) 25 MCG tablet Take 25 mcg by mouth every morning.    . loratadine (CLARITIN) 10 MG tablet Take 10 mg by  mouth daily.    Marland Kitchen LORazepam (ATIVAN) 1 MG tablet Take 1 mg by mouth at bedtime.    . metoprolol (LOPRESSOR) 50 MG tablet Take 50 mg by mouth 2 (two) times daily.    . mometasone (NASONEX) 50 MCG/ACT nasal spray Place 2 sprays into both nostrils daily.    . Multiple Vitamins-Minerals (MULTIVITAMIN WITH MINERALS) tablet Take 1 tablet by mouth daily.    . Omega-3 Fatty Acids (FISH OIL PO) Take 12 mg by mouth daily.    Vladimir Faster Glycol-Propyl Glycol 0.4-0.3 % SOLN Apply 1 drop to eye daily as needed (dryness).    . sucralfate (CARAFATE) 1 G tablet Take 1 g by mouth 4 (four) times daily -  before meals and at bedtime.    . traMADol (ULTRAM) 50 MG tablet Take 50 mg by mouth 2 (two) times daily.      No current facility-administered medications for this visit.    Review of Systems  Constitutional:  Constitutional negative. HENT: HENT negative.  Eyes: Eyes negative.  Cardiovascular: Positive for leg swelling.  GI: Gastrointestinal negative.  Musculoskeletal: Musculoskeletal negative.  Skin: Positive for wound.  Neurological: Neurological negative. Hematologic: Hematologic/lymphatic negative.  Psychiatric: Psychiatric negative.        Objective:  Objective   Vitals:   04/06/19 1339  BP: (!) 142/68  Pulse: 64  Resp: 18  Temp: (!) 97.3 F (36.3 C)  SpO2: 95%  Weight: 126 lb (57.2 kg)  Height: 5\' 4"  (1.626 m)   Body mass index is 21.63 kg/m.  Physical Exam HENT:     Nose:     Comments: Nickel sized wound left lateral ankle ulcer Cardiovascular:     Rate and Rhythm: Normal rate.     Pulses:          Radial pulses are 2+ on the right side and 2+ on the left side.       Femoral pulses are 2+ on the right side and 2+ on the left side.      Popliteal pulses are 0 on the right side and 0 on the left side.       Dorsalis pedis pulses are detected w/ Doppler on the right side and detected w/ Doppler on the left side.       Posterior tibial pulses are detected w/ Doppler on the  right side and detected w/ Doppler on the left side.  Pulmonary:  Effort: Pulmonary effort is normal.  Abdominal:     General: Abdomen is flat.     Palpations: Abdomen is soft.  Skin:    Capillary Refill: Capillary refill takes less than 2 seconds.  Neurological:     General: No focal deficit present.     Mental Status: She is alert.  Psychiatric:        Mood and Affect: Mood normal.        Behavior: Behavior normal.        Thought Content: Thought content normal.        Judgment: Judgment normal.     Data: I have independently interpreted her ABIs to be 1.5 bilaterally.  Toe pressure on the right 61 on the left 65.  All signals are monophasic.     Assessment/Plan:     86 year old female with left lateral ankle ulceration.  This does not appear infected.  She does have good capillary refill.  ABIs are monophasic likely falsely elevated.  Not sure that she does have the ability to heal the wound but at this time she does not want any procedures.  She is to see the wound care clinic next week.  If this persists is an issue I will be happy to see her back and schedule angiography which I discussed with her and her daughte via telephone today.     Waynetta Sandy MD [facility]

## 2019-04-12 ENCOUNTER — Other Ambulatory Visit: Payer: Self-pay

## 2019-04-12 ENCOUNTER — Encounter (HOSPITAL_BASED_OUTPATIENT_CLINIC_OR_DEPARTMENT_OTHER): Payer: Medicare Other | Attending: Internal Medicine | Admitting: Internal Medicine

## 2019-04-12 DIAGNOSIS — Z7901 Long term (current) use of anticoagulants: Secondary | ICD-10-CM | POA: Insufficient documentation

## 2019-04-12 DIAGNOSIS — L89623 Pressure ulcer of left heel, stage 3: Secondary | ICD-10-CM | POA: Insufficient documentation

## 2019-04-12 DIAGNOSIS — I70243 Atherosclerosis of native arteries of left leg with ulceration of ankle: Secondary | ICD-10-CM | POA: Insufficient documentation

## 2019-04-12 DIAGNOSIS — N182 Chronic kidney disease, stage 2 (mild): Secondary | ICD-10-CM | POA: Insufficient documentation

## 2019-04-12 DIAGNOSIS — E785 Hyperlipidemia, unspecified: Secondary | ICD-10-CM | POA: Insufficient documentation

## 2019-04-12 DIAGNOSIS — I495 Sick sinus syndrome: Secondary | ICD-10-CM | POA: Insufficient documentation

## 2019-04-12 DIAGNOSIS — E039 Hypothyroidism, unspecified: Secondary | ICD-10-CM | POA: Insufficient documentation

## 2019-04-12 DIAGNOSIS — I129 Hypertensive chronic kidney disease with stage 1 through stage 4 chronic kidney disease, or unspecified chronic kidney disease: Secondary | ICD-10-CM | POA: Diagnosis not present

## 2019-04-12 DIAGNOSIS — L97329 Non-pressure chronic ulcer of left ankle with unspecified severity: Secondary | ICD-10-CM | POA: Diagnosis not present

## 2019-04-12 NOTE — Progress Notes (Signed)
[name] [name], [name] [name] ([id number]) Visit Report for 04/12/2019 Chief Complaint Document Details Patient Name: Date of Service: [name], [name] 04/12/2019 10:30 AM Medical Record [id number] Patient Account Number: [id number] Date of Birth/Sex: Treating RN: [birth year] (86 y.o. [name] [name] Primary Care Provider: Jerlyn Ly Other Clinician: Referring Provider: Treating Provider/Extender:Aztlan Coll, Pollyann Kennedy, Vivianne Master in Treatment: 0 Information Obtained from: Patient Chief Complaint Patient presents to the wound care center today with an open arterial ulcer. The patient has had a spontaneous ulcer on the left ankle from February of this year. 04/12/2019; patient is here for review of wounds over her left lateral malleolus as well as more recently her left lateral calcaneus Electronic Signature(s) Signed: 04/12/2019 7:02:26 PM By: Linton Ham MD Entered By: Linton Ham on 04/12/2019 12:31:01 -------------------------------------------------------------------------------- Debridement Details Patient Name: Date of Service: [name] [name]. 04/12/2019 10:30 AM Medical Record [id number] Patient Account Number: [id number] Date of Birth/Sex: Treating RN: 07-18-1917 (86 y.o. [name], Meta.Reding Primary Care Provider: Jerlyn Ly Other Clinician: Referring Provider: Treating Provider/Extender:Malicia Blasdel, Pollyann Kennedy, Vivianne Master in Treatment: 0 Debridement Performed for Wound #3 Left,Lateral Calcaneus Assessment: Performed By: Physician Ricard Dillon., MD Debridement Type: Debridement Level of Consciousness (Pre- Awake and Alert procedure): Pre-procedure Verification/Time Out Taken: Yes - 11:35 Start Time: 11:36 Pain Control: Lidocaine 4% Topical Solution Total Area Debrided (L x W): 0.7 (cm) x 0.3 (cm) = 0.21 (cm) Tissue and other material Viable, Non-Viable, Subcutaneous, Skin: Dermis , Fibrin/Exudate Viable, Non-Viable, Subcutaneous, Skin: Dermis ,  Fibrin/Exudate debrided: Level: Skin/Subcutaneous Tissue Debridement Description: Excisional Instrument: Curette Bleeding: Minimum Hemostasis Achieved: Pressure End Time: 11:42 Procedural Pain: 0 Post Procedural Pain: 0 Response to Treatment: Procedure was tolerated well Level of Consciousness Awake and Alert (Post-procedure): Post Debridement Measurements of Total Wound Length: (cm) 0.7 Stage: Category/Stage III Width: (cm) 0.3 Depth: (cm) 0.3 Volume: (cm) 0.049 Character of Wound/Ulcer Post Improved Debridement: Post Procedure Diagnosis Same as Pre-procedure Electronic Signature(s) Signed: 04/12/2019 4:55:35 PM By: Deon Pilling Signed: 04/12/2019 7:02:26 PM By: Linton Ham MD Entered By: Linton Ham on 04/12/2019 12:30:14 -------------------------------------------------------------------------------- HPI Details Patient Name: Date of Service: [name] [name]. 04/12/2019 10:30 AM Medical Record [id number] Patient Account Number: [id number] Date of Birth/Sex: Treating RN: July 19, 1917 (86 y.o. [name] [name] Primary Care Provider: Jerlyn Ly Other Clinician: Referring Provider: Treating Provider/Extender:[name], Pollyann Kennedy, Vivianne Master in Treatment: 0 History of Present Illness Location: Patient presents with an ulcer on the left lateral ankle. Quality: Patient reports No Pain. Severity: Patient states wound(s) are getting worse. Duration: Patient has had the wound for > 6 months prior to seeking treatment at the wound center Timing: Pain in wound is Intermittent (comes and goes Context: The wound would happen gradually Modifying Factors: the ulcer has been persistent after treatment for at least 3 months and at the present time collagen and has been Associated Signs and Symptoms: Patient reports presence of swelling HPI Description: Pleasant 86 year old patient who has no significant medical problems except mild high blood pressure, cardiac  irregularity and generalized aches and pains. She presented to the podiatrist Dr. Melony Overly for a left lateral ankle ulceration and has been treated for about 3 months without resolution. The patient was referred to [place] for further care. The patient has not had any vascular study done except for a DVT study done about 2 years ago which was negative for DVT of the lower extremities. 09/11/2014 -- the patient had her vascular study done today but the reports are not  ready yet. 09/18/2014 -- the ABI was not measured due to having noncompressible vessels due to medial calcification. However the bilateral tibial artery Doppler waveform in the TBI was normal and no further tests were recommended. 10/30/2014 -- she has developed a superficial abrasion just medial and superior to her previous wound. 11/13/2014 -- she has been authorized to get the tri-layer Oasis. She has done very well with her [place] last week. 12/18/2014 -- her wound has done very well but stalled a bit due to possible superimposed tape burn. After cleaning the wound I believe it's time to use her tri-layer Oasis. 12/25/2014 -- She has had her Second application of Oasis. 01/01/2015 -- her wound looks excellent today and we will not use the next application of Oasis. 01/15/2015 -- she is here for review after 2 weeks due to scheduling issues. she has been doing the dressing as advised READMISSION 04/12/2019 Patient is now 86 year old woman. She was here in the wound care center in 2016 under the care of Dr. Con Memos with a left lateral ankle ulcer. Eventually this healed over however an area in the same location opened up at the end of January. She is known to have PAD and follow-up arterial studies were ordered on 3/12. This showed noncompressible ABIs bilaterally at 1.51. Great toe pressures were only 61 on the right and 65 on the left resulting in TBI's of 0.36 and 0.39 respectively. The patient apparently declined any  invasive procedures including angiography. About a week ago she had another wound open up on the lateral part of her heel just above the weightbearing plantar part of the heel. It is felt that this was probably a pressure ulcer. The patient is not a diabetic or smoker. She is not complaining of a lot of pain. She does complain of the heel more than her lateral malleolus. Past medical history includes sick sinus syndrome on Eliquis, hypothyroidism, macular degeneration, hypertension, hyperlipidemia, stage II chronic renal failure apparently some history of malignancy involving her stomach Electronic Signature(s) Signed: 04/12/2019 7:02:26 PM By: Linton Ham MD Entered By: Linton Ham on 04/12/2019 12:38:02 -------------------------------------------------------------------------------- Physical Exam Details Patient Name: Date of Service: [name] [name]. 04/12/2019 10:30 AM Medical Record [id number] Patient Account Number: [id number] Date of Birth/Sex: Treating RN: [birth year] (86 y.o. [name] [name] Primary Care Provider: Jerlyn Ly Other Clinician: Referring Provider: Treating Provider/Extender:Magali Bray, Pollyann Kennedy, Vivianne Master in Treatment: 0 Constitutional Patient is hypertensive.. Pulse regular and within target range for patient.Marland Kitchen Respirations regular, non-labored and within target range.. Temperature is normal and within the target range for the patient.Marland Kitchen Appears in no distress. Eyes Conjunctivae clear. No discharge.no icterus. Respiratory work of breathing is normal. Cardiovascular Popliteal and femoral pulses are palpable on the left. Pedal pulses are absent in the left foot. Integumentary (Hair, Skin) No primary cutaneous concerns.. Psychiatric appears at normal baseline. Notes Wound exam; the area has a small wound with some depth on the left lateral malleolus. Even under illumination does not appear to have anything that required debridement on the  surface. There was no surrounding erythema. The more recent wound is on her left lateral heel just above the plantar heel. This did require debridement of skin subcutaneous tissue. Hemostasis with direct pressure. Electronic Signature(s) Signed: 04/12/2019 7:02:26 PM By: Linton Ham MD Entered By: Linton Ham on 04/12/2019 12:40:40 -------------------------------------------------------------------------------- Physician Orders Details Patient Name: Date of Service: [name] [name]. 04/12/2019 10:30 AM Medical Record [id number] Patient Account Number: [id number] Date of Birth/Sex: Treating  RN: [birth year] (86 y.o. [name] [name] Primary Care Provider: Jerlyn Ly Other Clinician: Referring Provider: Treating Provider/Extender:Parthiv Mucci, Pollyann Kennedy, Vivianne Master in Treatment: 0 Verbal / Phone Orders: No Diagnosis Coding Follow-up Appointments Return Appointment in 1 week. - Thursday Dressing Change Frequency Change Dressing every other day. Skin Barriers/Peri-Wound Care Wound #2 Left,Lateral Malleolus Skin Prep Wound Cleansing May shower with protection. - protect the dressing when showering on days not changing the dressing. May shower and wash wound with soap and water. - with dressing changes only. Primary Wound Dressing Wound #2 Left,Lateral Malleolus Silver Collagen - moisten with hydrogel or KY jelly. Wound #3 Left,Lateral Calcaneus Silver Collagen - moisten with hydrogel or KY jelly. Secondary Dressing Wound #3 Left,Lateral Calcaneus Kerlix/Rolled Gauze Dry Gauze Heel Cup Foam Border - or bordered gauze Edema Control Avoid standing for long periods of time Elevate legs to the level of the heart or above for 30 minutes daily and/or when sitting, a frequency of: - throughout the day. Off-Loading Open toe surgical shoe to: - left foot. Other: - Patient to wear bunny boot to aid in relieving pressure from left heel and ankle. Additional Orders /  Instructions Follow Nutritious Diet - increase protein and vegetables to aid in wound healing. Patient Medications Allergies: No Known Allergies Notifications Medication Indication Start End lidocaine DOSE topical 4 % gel - gel topical applied before debridement by MD. Electronic Signature(s) Signed: 04/12/2019 4:55:35 PM By: Deon Pilling Signed: 04/12/2019 7:02:26 PM By: Linton Ham MD Entered By: Deon Pilling on 04/12/2019 11:41:24 -------------------------------------------------------------------------------- Prescription 04/12/2019 Patient Name: [name] [name] Provider: Linton Ham MD Date of Birth: 1917-03-24 NPI#: [id number] Sex: F DEA#: [id number] Phone #: [phone] License #: [id number] Patient Address: [address] [place], [place] [address] [place], [place] [address] [phone] Allergies No Known Allergies Medication Medication: Route: Strength: Form: lidocaine topical 4% gel Class: TOPICAL LOCAL ANESTHETICS Dose: Frequency / Time: Indication: gel topical applied before debridement by MD. Number of Refills: Number of Units: 0 Generic Substitution: Start Date: End Date: Administered at Substitution Permitted Facility: Yes Time Administered: Time Discontinued: Note to Pharmacy: Signature(s): Date(s): Electronic Signature(s) Signed: 04/12/2019 4:55:35 PM By: Deon Pilling Signed: 04/12/2019 7:02:26 PM By: Linton Ham MD Entered By: Deon Pilling on 04/12/2019 11:41:24 --------------------------------------------------------------------------------  Problem List Details Patient Name: Date of Service: [name] [name]. 04/12/2019 10:30 AM Medical Record [id number] Patient Account Number: [id number] Date of Birth/Sex: Treating RN: 12-06-1917 (86 y.o. [name] [name] Primary Care Provider: Jerlyn Ly Other Clinician: Referring Provider: Treating  Provider/Extender:Antonique Langford, Pollyann Kennedy, Vivianne Master in Treatment: 0 Active Problems ICD-10 Evaluated Encounter Code Description Active Date Today Diagnosis I70.243 Atherosclerosis of native arteries of left leg with 04/12/2019 No Yes ulceration of ankle L89.623 Pressure ulcer of left heel, stage 3 04/12/2019 No Yes Inactive Problems Resolved Problems Electronic Signature(s) Signed: 04/12/2019 7:02:26 PM By: Linton Ham MD Entered By: Linton Ham on 04/12/2019 12:27:31 -------------------------------------------------------------------------------- Progress Note Details Patient Name: Date of Service: [name] [name]. 04/12/2019 10:30 AM Medical Record [id number] Patient Account Number: [id number] Date of Birth/Sex: Treating RN: [birth year] (86 y.o. [name] [name] Primary Care Provider: Jerlyn Ly Other Clinician: Referring Provider: Treating Provider/Extender:Dot Splinter, Pollyann Kennedy, Vivianne Master in Treatment: 0 Subjective Chief Complaint Information obtained from Patient Patient presents to the wound care center today with an open arterial ulcer. The patient has had a spontaneous ulcer on the left ankle from February  of this year. 04/12/2019; patient is here for review of wounds over her left lateral malleolus as well as more recently her left lateral calcaneus History of Present Illness (HPI) The following HPI elements were documented for the patient's wound: Location: Patient presents with an ulcer on the left lateral ankle. Quality: Patient reports No Pain. Severity: Patient states wound(s) are getting worse. Duration: Patient has had the wound for > 6 months prior to seeking treatment at the wound center Timing: Pain in wound is Intermittent (comes and goes Context: The wound would happen gradually Modifying Factors: the ulcer has been persistent after treatment for at least 3 months and at the present time collagen and has been Associated Signs and  Symptoms: Patient reports presence of swelling Pleasant 86 year old patient who has no significant medical problems except mild high blood pressure, cardiac irregularity and generalized aches and pains. She presented to the podiatrist Dr. Melony Overly for a left lateral ankle ulceration and has been treated for about 3 months without resolution. The patient was referred to [place] for further care. The patient has not had any vascular study done except for a DVT study done about 2 years ago which was negative for DVT of the lower extremities. 09/11/2014 -- the patient had her vascular study done today but the reports are not ready yet. 09/18/2014 -- the ABI was not measured due to having noncompressible vessels due to medial calcification. However the bilateral tibial artery Doppler waveform in the TBI was normal and no further tests were recommended. 10/30/2014 -- she has developed a superficial abrasion just medial and superior to her previous wound. 11/13/2014 -- she has been authorized to get the tri-layer Oasis. She has done very well with her [address] last week. 12/18/2014 -- her wound has done very well but stalled a bit due to possible superimposed tape burn. After cleaning the wound I believe it's time to use her tri-layer Oasis. 12/25/2014 -- She has had her Second application of Oasis. 01/01/2015 -- her wound looks excellent today and we will not use the next application of Oasis. 01/15/2015 -- she is here for review after 2 weeks due to scheduling issues. she has been doing the dressing as advised READMISSION 04/12/2019 Patient is now 86 year old woman. She was here in the wound care center in 2016 under the care of Dr. Con Memos with a left lateral ankle ulcer. Eventually this healed over however an area in the same location opened up at the end of January. She is known to have PAD and follow-up arterial studies were ordered on 3/12. This showed noncompressible ABIs bilaterally at  1.51. Great toe pressures were only 61 on the right and 65 on the left resulting in TBI's of 0.36 and 0.39 respectively. The patient apparently declined any invasive procedures including angiography. About a week ago she had another wound open up on the lateral part of her heel just above the weightbearing plantar part of the heel. It is felt that this was probably a pressure ulcer. The patient is not a diabetic or smoker. She is not complaining of a lot of pain. She does complain of the heel more than her lateral malleolus. Past medical history includes sick sinus syndrome on Eliquis, hypothyroidism, macular degeneration, hypertension, hyperlipidemia, stage II chronic renal failure apparently some history of malignancy involving her stomach Patient History Information obtained from Patient. Allergies No Known Allergies Family History Diabetes - Child,Siblings, Heart Disease - Siblings, Hypertension - Siblings, No family history of Cancer, Hereditary Spherocytosis,  Kidney Disease, Lung Disease, Seizures, Stroke, Thyroid Problems, Tuberculosis. Social History Never smoker, Marital Status - Widowed, Alcohol Use - Never, Drug Use - No History, Caffeine Use - Moderate. Medical History Eyes Patient has history of Cataracts - surgery Denies history of Glaucoma, Optic Neuritis Ear/Nose/Mouth/Throat Patient has history of Chronic sinus problems/congestion Denies history of Middle ear problems Hematologic/Lymphatic Patient has history of Anemia Denies history of Hemophilia, Human Immunodeficiency Virus, Lymphedema, Sickle Cell Disease Respiratory Denies history of Aspiration, Asthma, Chronic Obstructive Pulmonary Disease (COPD), Pneumothorax, Sleep Apnea, Tuberculosis Cardiovascular Patient has history of Angina, Arrhythmia - Pacemaker insertion, Hypertension, Peripheral Arterial Disease, Peripheral Venous Disease Denies history of Congestive Heart Failure, Coronary Artery Disease, Deep Vein  Thrombosis, Hypotension, Myocardial Infarction, Phlebitis, Vasculitis Gastrointestinal Denies history of Cirrhosis , Colitis, Crohnoos, Hepatitis A, Hepatitis B, Hepatitis C Endocrine Denies history of Type I Diabetes, Type II Diabetes Genitourinary Denies history of End Stage Renal Disease Immunological Denies history of Lupus Erythematosus, Raynaudoos, Scleroderma Integumentary (Skin) Denies history of History of Burn Musculoskeletal Patient has history of Osteoarthritis Denies history of Gout, Rheumatoid Arthritis, Osteomyelitis Neurologic Denies history of Dementia, Neuropathy, Quadriplegia, Paraplegia, Seizure Disorder Oncologic Patient has history of Received Chemotherapy - 1992 - 85% stomach removed Denies history of Received Radiation Psychiatric Denies history of Anorexia/bulimia, Confinement Anxiety Hospitalization/Surgery History - Pacemaker. - Stomach Cancer. Medical And Surgical History Notes Eyes Macular degeneration Gastrointestinal stomach cancer, has bilirubin drainage tube Genitourinary Stage 2 kidney disease Oncologic Stomach cancer Review of Systems (ROS) Constitutional Symptoms (General Health) Denies complaints or symptoms of Fatigue, Fever, Chills, Marked Weight Change. Eyes Complains or has symptoms of Glasses / Contacts - glasses. Denies complaints or symptoms of Dry Eyes, Vision Changes. Ear/Nose/Mouth/Throat Denies complaints or symptoms of Chronic sinus problems or rhinitis. Respiratory Denies complaints or symptoms of Chronic or frequent coughs, Shortness of Breath. Cardiovascular Denies complaints or symptoms of Chest pain. Gastrointestinal Denies complaints or symptoms of Frequent diarrhea, Nausea, Vomiting. Endocrine Denies complaints or symptoms of Heat/cold intolerance. Genitourinary Denies complaints or symptoms of Frequent urination. Integumentary (Skin) Complains or has symptoms of Wounds - left ankle. Musculoskeletal Denies  complaints or symptoms of Muscle Pain, Muscle Weakness. Neurologic Denies complaints or symptoms of Numbness/parasthesias. Psychiatric Denies complaints or symptoms of Claustrophobia, Suicidal. Objective Constitutional Patient is hypertensive.. Pulse regular and within target range for patient.Marland Kitchen Respirations regular, non-labored and within target range.. Temperature is normal and within the target range for the patient.Marland Kitchen Appears in no distress. Vitals Time Taken: 10:30 AM, Height: 67 in, Source: Stated, Weight: 124 lbs, Source: Stated, BMI: 19.4, Temperature: 97.7 F, Pulse: 63 bpm, Respiratory Rate: 19 breaths/min, Blood Pressure: 165/58 mmHg. Eyes Conjunctivae clear. No discharge.no icterus. Respiratory work of breathing is normal. Cardiovascular Popliteal and femoral pulses are palpable on the left. Pedal pulses are absent in the left foot. Psychiatric appears at normal baseline. General Notes: Wound exam; the area has a small wound with some depth on the left lateral malleolus. Even under illumination does not appear to have anything that required debridement on the surface. There was no surrounding erythema. ooThe more recent wound is on her left lateral heel just above the plantar heel. This did require debridement of skin subcutaneous tissue. Hemostasis with direct pressure. Integumentary (Hair, Skin) No primary cutaneous concerns.. Wound #2 status is Open. Original cause of wound was Gradually Appeared. The wound is located on the Left,Lateral Malleolus. The wound measures 1.2cm length x 1.7cm width x 0.1cm depth; 1.602cm^2 area and 0.16cm^3 volume. There is Fat Layer (  Subcutaneous Tissue) Exposed exposed. There is no tunneling or undermining noted. There is a small amount of serous drainage noted. The wound margin is distinct with the outline attached to the wound base. There is medium (34-66%) pink granulation within the wound bed. There is a medium (34-66%) amount of  necrotic tissue within the wound bed including Adherent Slough. Wound #3 status is Open. Original cause of wound was Pressure Injury. The wound is located on the Left,Lateral Calcaneus. The wound measures 0.7cm length x 0.3cm width x 0.2cm depth; 0.165cm^2 area and 0.033cm^3 volume. There is Fat Layer (Subcutaneous Tissue) Exposed exposed. There is no tunneling or undermining noted. There is a small amount of serosanguineous drainage noted. The wound margin is well defined and not attached to the wound base. There is medium (34-66%) pink, pale granulation within the wound bed. There is a medium (34- 66%) amount of necrotic tissue within the wound bed including Adherent Slough. Assessment Active Problems ICD-10 Atherosclerosis of native arteries of left leg with ulceration of ankle Pressure ulcer of left heel, stage 3 Procedures Wound #3 Pre-procedure diagnosis of Wound #3 is a Pressure Ulcer located on the Left,Lateral Calcaneus . There was a Excisional Skin/Subcutaneous Tissue Debridement with a total area of 0.21 sq cm performed by Ricard Dillon., MD. With the following instrument(s): Curette to remove Viable and Non-Viable tissue/material. Material removed includes Subcutaneous Tissue, Skin: Dermis, and Fibrin/Exudate after achieving pain control using Lidocaine 4% Topical Solution. A time out was conducted at 11:35, prior to the start of the procedure. A Minimum amount of bleeding was controlled with Pressure. The procedure was tolerated well with a pain level of 0 throughout and a pain level of 0 following the procedure. Post Debridement Measurements: 0.7cm length x 0.3cm width x 0.3cm depth; 0.049cm^3 volume. Post debridement Stage noted as Category/Stage III. Character of Wound/Ulcer Post Debridement is improved. Post procedure Diagnosis Wound #3: Same as Pre-Procedure Plan Follow-up Appointments: Return Appointment in 1 week. - Thursday Dressing Change Frequency: Change  Dressing every other day. Skin Barriers/Peri-Wound Care: Wound #2 Left,Lateral Malleolus: Skin Prep Wound Cleansing: May shower with protection. - protect the dressing when showering on days not changing the dressing. May shower and wash wound with soap and water. - with dressing changes only. Primary Wound Dressing: Wound #2 Left,Lateral Malleolus: Silver Collagen - moisten with hydrogel or KY jelly. Wound #3 Left,Lateral Calcaneus: Silver Collagen - moisten with hydrogel or KY jelly. Secondary Dressing: Wound #3 Left,Lateral Calcaneus: Kerlix/Rolled Gauze Dry Gauze Heel Cup Foam Border - or bordered gauze Edema Control: Avoid standing for long periods of time Elevate legs to the level of the heart or above for 30 minutes daily and/or when sitting, a frequency of: - throughout the day. Off-Loading: Open toe surgical shoe to: - left foot. Other: - Patient to wear bunny boot to aid in relieving pressure from left heel and ankle. Additional Orders / Instructions: Follow Nutritious Diet - increase protein and vegetables to aid in wound healing. The following medication(s) was prescribed: lidocaine topical 4 % gel gel topical applied before debridement by MD. was prescribed at facility 1. We applied silver collagen moistened with hydrogel to both areas, heel cup, gauze 2. Emphasized pressure relief. Apparently the patient lies in bed flat on her back all night however I am fairly certain that her heels rotate outward laterally probably resulting in the wound on the left foot. We asked him to use a pillow and/or a foam boot which she apparently already has  3. The patient has apparently expressed a strong desire for nothing invasive and right now she does not appear to have deteriorating wounds or severe pain that would necessitate bringing this issue up again. 4. No evidence of infection here no cultures were done. I did not feel that the foot needed to be imaged Electronic  Signature(s) Signed: 04/12/2019 7:02:26 PM By: Linton Ham MD Entered By: Linton Ham on 04/12/2019 12:42:40 -------------------------------------------------------------------------------- HxROS Details Patient Name: Date of Service: [name] [name]. 04/12/2019 10:30 AM Medical Record [id number] Patient Account Number: [id number] Date of Birth/Sex: Treating RN: Aug 09, 1917 (86 y.o. [name] Primary Care Provider: Jerlyn Ly Other Clinician: Referring Provider: Treating Provider/Extender:Romi Rathel, Pollyann Kennedy, Vivianne Master in Treatment: 0 Information Obtained From Patient Constitutional Symptoms (General Health) Complaints and Symptoms: Negative for: Fatigue; Fever; Chills; Marked Weight Change Eyes Complaints and Symptoms: Positive for: Glasses / Contacts - glasses Negative for: Dry Eyes; Vision Changes Medical History: Positive for: Cataracts - surgery Negative for: Glaucoma; Optic Neuritis Past Medical History Notes: Macular degeneration Ear/Nose/Mouth/Throat Complaints and Symptoms: Negative for: Chronic sinus problems or rhinitis Medical History: Positive for: Chronic sinus problems/congestion Negative for: Middle ear problems Respiratory Complaints and Symptoms: Negative for: Chronic or frequent coughs; Shortness of Breath Medical History: Negative for: Aspiration; Asthma; Chronic Obstructive Pulmonary Disease (COPD); Pneumothorax; Sleep Apnea; Tuberculosis Cardiovascular Complaints and Symptoms: Negative for: Chest pain Medical History: Positive for: Angina; Arrhythmia - Pacemaker insertion; Hypertension; Peripheral Arterial Disease; Peripheral Venous Disease Negative for: Congestive Heart Failure; Coronary Artery Disease; Deep Vein Thrombosis; Hypotension; Myocardial Infarction; Phlebitis; Vasculitis Gastrointestinal Complaints and Symptoms: Negative for: Frequent diarrhea; Nausea; Vomiting Medical History: Negative for:  Cirrhosis ; Colitis; Crohns; Hepatitis A; Hepatitis B; Hepatitis C Past Medical History Notes: stomach cancer, has bilirubin drainage tube Endocrine Complaints and Symptoms: Negative for: Heat/cold intolerance Medical History: Negative for: Type I Diabetes; Type II Diabetes Genitourinary Complaints and Symptoms: Negative for: Frequent urination Medical History: Negative for: End Stage Renal Disease Past Medical History Notes: Stage 2 kidney disease Integumentary (Skin) Complaints and Symptoms: Positive for: Wounds - left ankle Medical History: Negative for: History of Burn Musculoskeletal Complaints and Symptoms: Negative for: Muscle Pain; Muscle Weakness Medical History: Positive for: Osteoarthritis Negative for: Gout; Rheumatoid Arthritis; Osteomyelitis Neurologic Complaints and Symptoms: Negative for: Numbness/parasthesias Medical History: Negative for: Dementia; Neuropathy; Quadriplegia; Paraplegia; Seizure Disorder Psychiatric Complaints and Symptoms: Negative for: Claustrophobia; Suicidal Medical History: Negative for: Anorexia/bulimia; Confinement Anxiety Hematologic/Lymphatic Medical History: Positive for: Anemia Negative for: Hemophilia; Human Immunodeficiency Virus; Lymphedema; Sickle Cell Disease Immunological Medical History: Negative for: Lupus Erythematosus; Raynauds; Scleroderma Oncologic Medical History: Positive for: Received Chemotherapy - 1992 - 85% stomach removed Negative for: Received Radiation Past Medical History Notes: Stomach cancer HBO Extended History Items Ear/Nose/Mouth/Throat: Eyes: Chronic sinus Cataracts problems/congestion Immunizations Pneumococcal Vaccine: Received Pneumococcal Vaccination: Yes Implantable Devices Yes Hospitalization / Surgery History Type of Hospitalization/Surgery Pacemaker Stomach Cancer Family and Social History Cancer: No; Diabetes: Yes - Child,Siblings; Heart Disease: Yes - Siblings; Hereditary  Spherocytosis: No; Hypertension: Yes - Siblings; Kidney Disease: No; Lung Disease: No; Seizures: No; Stroke: No; Thyroid Problems: No; Tuberculosis: No; Never smoker; Marital Status - Widowed; Alcohol Use: Never; Drug Use: No History; Caffeine Use: Moderate; Financial Concerns: No; Food, Clothing or Shelter Needs: No; Support System Lacking: No; Transportation Concerns: No Electronic Signature(s) Signed: 04/12/2019 4:36:06 PM By: Kela Millin Signed: 04/12/2019 7:02:26 PM By: Linton Ham MD Entered By: Kela Millin on 04/12/2019 10:52:09 -------------------------------------------------------------------------------- SuperBill Details Patient Name: Date of Service: Katherine Roan  M. 04/12/2019 Medical Record [id number] Patient Account Number: [id number] Date of Birth/Sex: Treating RN: 1917/01/29 (86 y.o. 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 L89.623 Pressure ulcer of left heel, stage 3 Facility Procedures The patient participates with Medicare or their insurance follows the Medicare Facility Guidelines: CPT4 Code Description Modifier Quantity [id number] [place] VISIT-LEV 3 EST PT 1 The patient participates with Medicare or their insurance follows the Medicare Facility Guidelines: [id number] - DEB SUBQ TISSUE 20 SQ CM/< 1 ICD-10 Diagnosis Description L89.623 Pressure ulcer of left heel, stage 3 Physician Procedures CPT4 Code Description: [id number] WC PHYS LEVEL 3 NEW PT ICD-10 Diagnosis Description I70.243 Atherosclerosis of native arteries of left leg with ulcer L89.623 Pressure ulcer of left heel, stage 3 Modifier: 25 ation of ankle Quantity: 1 CPT4 Code Description: [id number] 11042 - WC PHYS SUBQ TISS  20 SQ CM ICD-10 Diagnosis Description L89.623 Pressure ulcer of left heel, stage 3 Modifier: Quantity: 1 Electronic Signature(s) Signed: 04/12/2019 4:55:35 PM By: Deon Pilling Signed: 04/12/2019 7:02:26 PM By: Linton Ham MD Entered By: Deon Pilling on 04/12/2019 14:41:25

## 2019-04-12 NOTE — Progress Notes (Signed)
[name], [name] ([id number]) Visit Report for 04/12/2019 Allergy List Details Patient Name: Date of Service: [name] [name], [name] [name] 04/12/2019 10:30 AM Medical Record [id number] Patient Account Number: [id number] Date of Birth/Sex: Treating RN: [birth year] (86 y.o. [name] Primary Care Jahnai Slingerland: Jerlyn Ly Other Clinician: Referring Michell Giuliano: Treating Lala Been/Extender:Robson, Pollyann Kennedy, Vivianne Master in Treatment: 0 Allergies Active Allergies No Known Allergies Allergy Notes Electronic Signature(s) Signed: 04/12/2019 4:36:06 PM By: Kela Millin Entered By: Kela Millin on 04/12/2019 10:40:53 -------------------------------------------------------------------------------- Arrival Information Details Patient Name: Date of Service: [name] [name]. 04/12/2019 10:30 AM Medical Record [id number] Patient Account Number: [id number] Date of Birth/Sex: Treating RN: 10-13-1917 (86 y.o. [name] Primary Care Sylena Lotter: Jerlyn Ly Other Clinician: Referring Didier Brandenburg: Treating Kaileia Flow/Extender:Robson, Pollyann Kennedy, Vivianne Master in Treatment: 0 Visit Information Patient Arrived: Wheel Chair Arrival Time: 10:37 Accompanied By: family member Transfer Assistance: 24 Patient Lift Patient Identification Verified: Yes Secondary Verification Process Yes Completed: Patient Has Alerts: Yes Patient Alerts: Patient on Blood Thinner ABI L: 1.5 TBI L: 0.36 Electronic Signature(s) Signed: 04/12/2019 4:36:06 PM By: Kela Millin Entered By: Armando Reichert History Since Last Visit Pain Present Now: No -------------------------------------------------------------------------------- Clinic Level of Care Assessment Details Patient Name: Date of Service: [name] [name], [name] [name] 04/12/2019 10:30 AM Medical Record [id number] Patient Account Number: [id number] Date of Birth/Sex: Treating RN: [birth year] (86 y.o. [name] Primary  Care Vaishali Baise: Jerlyn Ly Other Clinician: Referring Jisel Fleet: Treating Brinnley Lacap/Extender:Robson, Pollyann Kennedy, Vivianne Master in Treatment: 0 Clinic Level of Care Assessment Items TOOL 1 Quantity Score X - Use when EandM and Procedure is performed on INITIAL visit 1 0 ASSESSMENTS - Nursing Assessment / Reassessment X - General Physical Exam (combine w/ comprehensive assessment (listed just below) 1 20 when performed on new pt. evals) X - Comprehensive Assessment (HX, ROS, Risk Assessments, Wounds Hx, etc.) 1 25 ASSESSMENTS - Wound and Skin Assessment / Reassessment X - Dermatologic / Skin Assessment (not related to wound area) 1 10 ASSESSMENTS - Ostomy and/or Continence Assessment and Care []  - Incontinence Assessment and Management 0 []  - Ostomy Care Assessment and Management (repouching, etc.) 0 PROCESS - Coordination of Care []  - Simple Patient / Family Education for ongoing care 0 X - Complex (extensive) Patient / Family Education for ongoing care 1 20 X - Staff obtains Programmer, systems, Records, Test Results / Process Orders 1 10 []  - Staff telephones HHA, Nursing Homes / Clarify orders / etc 0 []  - Routine Transfer to another Facility (non-emergent condition) 0 []  - Routine Hospital Admission (non-emergent condition) 0 X - New Admissions / Biomedical engineer / Ordering NPWT, Apligraf, etc. 1 15 []  - Emergency Hospital Admission (emergent condition) 0 PROCESS - Special Needs []  - Pediatric / Minor Patient Management 0 []  - Isolation Patient Management 0 []  - Hearing / Language / Visual special needs 0 []  - Assessment of Community assistance (transportation, D/C planning, etc.) 0 []  - Additional assistance / Altered mentation 0 []  - Support Surface(s) Assessment (bed, cushion, seat, etc.) 0 INTERVENTIONS - Miscellaneous []  - External ear exam 0 []  - Patient Transfer (multiple staff / Civil Service fast streamer / Similar devices) 0 []  - Simple Staple / Suture removal (25 or less) 0 []  -  Complex Staple / Suture removal (26 or more) 0 []  - Hypo/Hyperglycemic Management (do not check if billed separately) 0 []  - Ankle / Brachial Index (ABI) - do not check if billed separately 0 Has the patient been seen at the hospital within the  last three years: Yes Total Score: 100 Level Of Care: New/Established - Level 3 Electronic Signature(s) Signed: 04/12/2019 4:55:35 PM By: Deon Pilling Entered By: Deon Pilling on 04/12/2019 12:12:10 -------------------------------------------------------------------------------- Encounter Discharge Information Details Patient Name: Date of Service: [name] [name]. 04/12/2019 10:30 AM Medical Record [id number] Patient Account Number: [id number] Date of Birth/Sex: Treating RN: 02-19-1917 (86 y.o. [name] [name] Primary Care Starnisha Batrez: Jerlyn Ly Other Clinician: Referring Mindee Robledo: Treating Audriella Blakeley/Extender:Robson, Pollyann Kennedy, Vivianne Master in Treatment: 0 Encounter Discharge Information Items Post Procedure Vitals Discharge Condition: Stable Temperature (F): 97.7 Ambulatory Status: Wheelchair Pulse (bpm): 63 Discharge Destination: Home Respiratory Rate (breaths/min): 18 Transportation: Private Auto Blood Pressure (mmHg): 165/58 Accompanied By: daughter Schedule Follow-up Appointment: Yes Clinical Summary of Care: Patient Declined Electronic Signature(s) Signed: 04/12/2019 4:45:01 PM By: Baruch Gouty RN, BSN Entered By: Baruch Gouty on 04/12/2019 12:55:04 -------------------------------------------------------------------------------- Lower Extremity Assessment Details Patient Name: Date of Service: [name] [name]. 04/12/2019 10:30 AM Medical Record [id number] Patient Account Number: [id number] Date of Birth/Sex: Treating RN: [birth year] (86 y.o. [name] Primary Care Lalo Tromp: Jerlyn Ly Other Clinician: Referring Khylie Larmore: Treating Sukaina Toothaker/Extender:Robson, Pollyann Kennedy, Vivianne Master in  Treatment: 0 Edema Assessment Assessed: [Left: No] [Right: No] Edema: [Left: Ye] [Right: s] Calf Left: Right: Point of Measurement: cm From Medial Instep 31 cm cm Ankle Left: Right: Point of Measurement: cm From Medial Instep 20.8 cm cm Electronic Signature(s) Signed: 04/12/2019 4:36:06 PM By: Kela Millin Entered By: Kela Millin on 04/12/2019 10:48:15 -------------------------------------------------------------------------------- Multi Wound Chart Details Patient Name: Date of Service: [name] [name]. 04/12/2019 10:30 AM Medical Record [id number] Patient Account Number: [id number] Date of Birth/Sex: Treating RN: 07-05-1917 (86 y.o. 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] [N/A:N/A] Comorbid History: [2:Cataracts, Chronic sinus Cataracts, Chronic sinus N/A problems/congestion, Anemia, Angina, Arrhythmia, Hypertension, Arrhythmia, Hypertension, Peripheral Arterial Disease, Peripheral Arterial Disease, Peripheral Venous Disease,  Peripheral Venous Disease, Osteoarthritis, Received Chemotherapy] [3:problems/congestion, Anemia, Angina, Osteoarthritis, Received Chemotherapy] Date Acquired: [2:02/22/2019] [3:04/04/2019] [N/A:N/A] Weeks of Treatment: [2:0] [3:0] [N/A:N/A] Wound Status: [2:Open] [3:Open] [N/A:N/A] Measurements L x W x D 1.2x1.7x0.1 [3:0.7x0.3x0.2] [N/A:N/A] (cm) Area (cm) : [2:1.602] [3:0.165]  [N/A:N/A] Volume (cm) : [2:0.16] [3:0.033] [N/A:N/A] % Reduction in Area: [2:N/A] [3:0.00%] [N/A:N/A] % Reduction in Volume: N/A [3:0.00%] [N/A:N/A] Classification: [2:Full Thickness Without Exposed Support Structures] [3:Category/Stage III] [N/A:N/A] Exudate Amount: [2:Small] [3:Small] [N/A:N/A] Exudate Type: [2:Serous] [3:Serosanguineous] [N/A:N/A] Exudate Color: [2:amber] [3:red, brown] [N/A:N/A] Wound Margin: [2:Distinct, outline attached Well defined, not attached N/A] Granulation Amount: [2:Medium (34-66%)] [3:Medium (34-66%)] [N/A:N/A] Granulation Quality: [2:Pink] [3:Pink, Pale] [N/A:N/A] Necrotic Amount: [2:Medium (34-66%)] [3:Medium (34-66%)] [N/A:N/A] Exposed Structures: [2:Fat Layer (Subcutaneous Fat Layer (Subcutaneous N/A Tissue) Exposed: Yes Fascia: No Tendon: No Muscle: No Joint: No Bone: No] [3:Tissue) Exposed: Yes Fascia: No Tendon: No Muscle: No Joint: No Bone: No] Epithelialization: [2:None] [3:None] [N/A:N/A] Debridement: [2:N/A] [3:Debridement - Excisional N/A] Pre-procedure [2:N/A] [3:11:35] [N/A:N/A] Verification/Time Out Taken: Pain Control: [2:N/A] [3:Lidocaine 4% Topical Solution] [N/A:N/A] Tissue Debrided: [2:N/A] [3:Subcutaneous] [N/A:N/A] Level: [2:N/A] [3:Skin/Subcutaneous Tissue N/A] Debridement Area (sq cm):N/A [3:0.21] [N/A:N/A] Instrument: [2:N/A] [3:Curette] [N/A:N/A] Bleeding: [2:N/A] [3:Minimum] [N/A:N/A] Hemostasis Achieved: [2:N/A] [3:Pressure] [N/A:N/A] Procedural Pain: [  2:N/A] [3:0] [N/A:N/A] Post Procedural Pain: [2:N/A] [3:0] [N/A:N/A] Debridement Treatment N/A [3:Procedure was tolerated] [N/A:N/A] Response: [3:well] Post Debridement [2:N/A] [3:0.7x0.3x0.3] [N/A:N/A] Measurements L x W x D (cm) Post Debridement [2:N/A] [3:0.049] [N/A:N/A] Volume: (cm) Post Debridement Stage: N/A [3:Category/Stage III Debridement] [N/A:N/A N/A] Treatment Notes Electronic Signature(s) Signed: 04/12/2019 4:55:35 PM By: Deon Pilling Signed: 04/12/2019  7:02:26 PM By: Linton Ham MD Entered By: Linton Ham on 04/12/2019 12:30:01 -------------------------------------------------------------------------------- Multi-Disciplinary Care Plan Details Patient Name: Date of Service: [name] [name]. 04/12/2019 10:30 AM Medical Record [id number] Patient Account Number: [id number] Date of Birth/Sex: Treating RN: 11-29-1917 (86 y.o. [name], Tammi Klippel Primary Care Jesi Jurgens: Jerlyn Ly Other Clinician: Referring Zykee Avakian: Treating Rivka Baune/Extender:Robson, Pollyann Kennedy, Vivianne Master in Treatment: 0 Active Inactive Abuse / Safety / Falls / Self Care Management Nursing Diagnoses: Potential for falls Goals: Patient will remain injury free related to falls Date Initiated: 04/12/2019 Target Resolution Date: 05/04/2019 Goal Status: Active Patient/caregiver will verbalize understanding of skin care regimen Date Initiated: 04/12/2019 Target Resolution Date: 05/04/2019 Goal Status: Active Interventions: Provide education on fall prevention Notes: Nutrition Nursing Diagnoses: Potential for alteratiion in Nutrition/Potential for imbalanced nutrition Goals: Patient/caregiver agrees to and verbalizes understanding of need to obtain nutritional consultation Date Initiated: 04/12/2019 Target Resolution Date: 05/04/2019 Goal Status: Active Interventions: Provide education on nutrition Treatment Activities: Patient referred to Primary Care Physician for further nutritional evaluation : 04/12/2019 Notes: Orientation to the Wound Care Program Nursing Diagnoses: Knowledge deficit related to the wound healing center program Goals: Patient/caregiver will verbalize understanding of the [place] Program Date Initiated: 04/12/2019 Target Resolution Date: 04/27/2019 Goal Status: Active Interventions: Provide education on orientation to the wound center Notes: Wound/Skin Impairment Nursing Diagnoses: Knowledge deficit related to  ulceration/compromised skin integrity Goals: Ulcer/skin breakdown will heal within 14 weeks Date Initiated: 04/12/2019 Target Resolution Date: 08/03/2019 Goal Status: Active Interventions: Assess patient/caregiver ability to obtain necessary supplies Assess patient/caregiver ability to perform ulcer/skin care regimen upon admission and as needed Provide education on ulcer and skin care Treatment Activities: Skin care regimen initiated : 04/12/2019 Topical wound management initiated : 04/12/2019 Notes: Electronic Signature(s) Signed: 04/12/2019 4:55:35 PM By: Deon Pilling Entered By: Deon Pilling on 04/12/2019 10:47:34 -------------------------------------------------------------------------------- Pain Assessment Details Patient Name: Date of Service: SHAMYA, VILLARRUEL 04/12/2019 10:30 AM Medical Record [id number] Patient Account Number: [id number] Date of Birth/Sex: Treating RN: August 16, 1917 (86 y.o. [name] Primary Care Kiahna Banghart: Jerlyn Ly Other Clinician: Referring Makensie Mulhall: Treating Amandeep Nesmith/Extender:Robson, Pollyann Kennedy, Vivianne Master in Treatment: 0 Active Problems Location of Pain Severity and Description of Pain Patient Has Paino No Site Locations Pain Management and Medication Current Pain Management: Electronic Signature(s) Signed: 04/12/2019 4:36:06 PM By: Kela Millin Entered By: Kela Millin on 04/12/2019 10:59:16 -------------------------------------------------------------------------------- Patient/Caregiver Education Details Patient Name: Date of Service: [name] [name] 3/18/2021andnbsp10:30 AM Medical Record [id number] Patient Account Number: [id number] Date of Birth/Gender: 1917-07-21 (86 y.o. F) Treating RN: Deon Pilling Primary Care Physician: Jerlyn Ly Other Clinician: Referring Physician: Treating Physician/Extender:Robson, Pollyann Kennedy, Vivianne Master in Treatment: 0 Education Assessment Education Provided  To: Patient and Caregiver Education Topics Provided Welcome To [facility]: Handouts: Welcome To [facility] Methods: Explain/Verbal, Printed Responses: Reinforcements needed Wound/Skin Impairment: Handouts: Caring for Your Ulcer, Skin Care Do's and Dont's Methods: Explain/Verbal, Printed Responses: Reinforcements needed Electronic Signature(s) Signed: 04/12/2019 4:55:35 PM By: Deon Pilling Entered By: Deon Pilling on 04/12/2019 10:48:00 -------------------------------------------------------------------------------- Wound Assessment Details Patient Name: Date of Service: [name] [name]. 04/12/2019  10:30 AM Medical Record [id number] Patient Account Number: [id number] Date of Birth/Sex: Treating RN: 08-Jan-1918 (86 y.o. [name] Primary Care Kynnedy Carreno: Jerlyn Ly Other Clinician: Referring Ataya Murdy: Treating Diavian Furgason/Extender:Robson, Pollyann Kennedy, Vivianne Master in Treatment: 0 Wound Status Wound Number: 2 Primary Arterial Insufficiency Ulcer Etiology: Wound Location: Left Malleolus - Lateral Wound Open Wounding Event: Gradually Appeared Status: Date Acquired: 02/22/2019 Comorbid Cataracts, Chronic sinus problems/congestion, Weeks Of Treatment: 0 History: Anemia, Angina, Arrhythmia, Hypertension, Clustered Wound: No Peripheral Arterial Disease, Peripheral Venous Disease, Osteoarthritis, Received Chemotherapy Wound Measurements Length: (cm) 1.2 % Reduc Width: (cm) 1.7 % Reduc Depth: (cm) 0.1 Epithel Area: (cm) 1.602 Tunnel Volume: (cm) 0.16 Underm Wound Description Classification: Full Thickness Without Exposed Support Foul O Structures Slough Wound Distinct, outline attached Margin: Exudate Small Amount: Exudate Serous Type: Exudate amber Color: Wound Bed Granulation Amount: Medium (34-66%) Granulation Quality: Pink Fascia Necrotic Amount: Medium (34-66%) Fat La Necrotic Quality: Adherent Slough  Tendon Muscle Joint Expo Bone Expos dor After Cleansing: No /Fibrino Yes Exposed Structure Exposed: No yer (Subcutaneous Tissue) Exposed: Yes Exposed: No Exposed: No sed: No ed: No tion in Area: tion in Volume: ialization: None ing: No ining: No Treatment Notes Wound #2 (Left, Lateral Malleolus) 3. Primary Dressing Applied Collegen [employer] 4. Secondary Dressing Dry Gauze Roll Gauze Heel Cup 7. Footwear/Offloading device applied Surgical shoe Electronic Signature(s) Signed: 04/12/2019 4:36:06 PM By: Kela Millin Entered By: Kela Millin on 04/12/2019 10:56:08 -------------------------------------------------------------------------------- Wound Assessment Details Patient Name: Date of Service: [name] [name]. 04/12/2019 10:30 AM Medical Record [id number] Patient Account Number: [id number] Date of Birth/Sex: Treating RN: 1917/02/02 (86 y.o. [name], Meta.Reding Primary Care Okie Bogacz: Jerlyn Ly Other Clinician: Referring Inri Sobieski: Treating Jameshia Hayashida/Extender:Robson, Pollyann Kennedy, Vivianne Master in Treatment: 0 Wound Status Wound Number: 3 Primary Pressure Ulcer Etiology: Wound Location: Left, Lateral Calcaneus Wound Open Wounding Event: Pressure Injury Status: Date Acquired: 04/04/2019 Comorbid Cataracts, Chronic sinus problems/congestion, Weeks Of Treatment: 0 History: Anemia, Angina, Arrhythmia, Hypertension, Clustered Wound: No Peripheral Arterial Disease, Peripheral Venous Disease, Osteoarthritis, Received Chemotherapy Wound Measurements Length: (cm) 0.7 Width: (cm) 0.3 Depth: (cm) 0.2 Area: (cm) 0.165 Volume: (cm) 0.033 Wound Description Classification: Category/Stage III Wound Margin: Well defined, not attached Exudate Amount: Small Exudate Type: Serosanguineous Exudate Color: red, brown Wound Bed Granulation Amount: Medium (34-66%) Granulation Quality: Pink, Pale Necrotic Amount: Medium (34-66%) Necrotic Quality: Adherent  Slough Foul Odor After Cleansing: N Slough/Fibrino Y Exposed Structure Fascia Exposed: No Fat Layer (Subcutaneous Tissue) Exposed: Ye Tendon Exposed: No Muscle Exposed: No Joint Exposed: No Bone Exposed: No % Reduction in Area: 0% % Reduction in Volume: 0% Epithelialization: None Tunneling: No Undermining: No o es s Treatment Notes Wound #3 (Left, Lateral Calcaneus) 3. Primary Dressing Applied Collegen [employer] 4. Secondary Dressing Dry Gauze Roll Gauze Heel Cup 7. Footwear/Offloading device applied Surgical shoe Electronic Signature(s) Signed: 04/12/2019 4:55:35 PM By: Deon Pilling Entered By: Deon Pilling on 04/12/2019 11:38:35 -------------------------------------------------------------------------------- Vitals Details Patient Name: Date of Service: [name] [name]. 04/12/2019 10:30 AM Medical Record [id number] Patient Account Number: [id number] Date of Birth/Sex: Treating RN: Dec 17, 1917 (86 y.o. [name] Primary Care Holmes Hays: Jerlyn Ly Other Clinician: Referring Alisyn Lequire: Treating Chastity Noland/Extender:Robson, Pollyann Kennedy, Vivianne Master in Treatment: 0 Vital Signs Time Taken: 10:30 Temperature (F): 97.7 Height (in): 67 Pulse (bpm): 63 Source: Stated Respiratory Rate (breaths/min): 19 Weight (lbs): 124 Blood Pressure (mmHg): 165/58 Source: Stated Reference Range: 80 - 120 mg / dl Body Mass Index (BMI): 19.4 Electronic Signature(s) Signed: 04/12/2019 4:36:06  PM By: Kela Millin Entered By: Kela Millin on 04/12/2019 10:40:43

## 2019-04-12 NOTE — Progress Notes (Signed)
[name] [name], [name] [name] ([id number]) Visit Report for 04/12/2019 Abuse/Suicide Risk Screen Details Patient Name: Date of Service: [name] [name], [name] [name] 04/12/2019 10:30 AM Medical Record [id number] Patient Account Number: [id number] Date of Birth/Sex: Treating RN: 1917/04/24 (86 y.o. [name] Primary Care Nicandro Perrault: Jerlyn Ly Other Clinician: Referring Naimah Yingst: Treating Akeel Reffner/Extender:Robson, Pollyann Kennedy, Vivianne Master in Treatment: 0 Abuse/Suicide Risk Screen Items Answer ABUSE RISK SCREEN: Has anyone close to you tried to hurt or harm you recentlyo No Do you feel uncomfortable with anyone in your familyo No Has anyone forced you do things that you didnt want to doo No Electronic Signature(s) Signed: 04/12/2019 4:36:06 PM By: Kela Millin Entered By: Kela Millin on 04/12/2019 10:44:00 -------------------------------------------------------------------------------- Activities of Daily Living Details Patient Name: Date of Service: [name] [name], [name] [name] 04/12/2019 10:30 AM Medical Record [id number] Patient Account Number: [id number] Date of Birth/Sex: Treating RN: [birth year] (86 y.o. [name] Primary Care Krystan Northrop: Jerlyn Ly Other Clinician: Referring Chantal Worthey: Treating Velicia Dejager/Extender:Robson, Pollyann Kennedy, Vivianne Master in Treatment: 0 Activities of Daily Living Items Answer Activities of Daily Living (Please select one for each item) Drive Automobile Not Able Take Medications Need Assistance Use Telephone Need Assistance Care for Appearance Need Assistance Use Toilet Need Assistance Bath / Shower Need Assistance Dress Self Need Assistance Feed Self Completely Able Walk Need Assistance Get In / Out Bed Need Assistance Housework Need Assistance Prepare Meals Need Assistance Handle Money Need Assistance Shop for Self Need Assistance Electronic Signature(s) Signed: 04/12/2019 4:36:06 PM By: Kela Millin Entered By:  Kela Millin on 04/12/2019 10:44:51 -------------------------------------------------------------------------------- Education Screening Details Patient Name: Date of Service: [name] Oman. 04/12/2019 10:30 AM Medical Record [id number] Patient Account Number: [id number] Date of Birth/Sex: Treating RN: 01-May-1917 (86 y.o. [name] Primary Care Royal Vandevoort: Jerlyn Ly Other Clinician: Referring Myah Guynes: Treating Simuel Stebner/Extender:Robson, Pollyann Kennedy, Vivianne Master in Treatment: 0 Primary Learner Assessed: Patient Learning Preferences/Education Level/Primary Language Learning Preference: Explanation Preferred Language: English Cognitive Barrier Language Barrier: No Translator Needed: No Memory Deficit: No Emotional Barrier: No Cultural/Religious Beliefs Affecting Medical Care: No Physical Barrier Impaired Vision: Yes Glasses Impaired Hearing: Yes Decreased Hand dexterity: No Knowledge/Comprehension Knowledge Level: High Comprehension Level: High Ability to understand written High instructions: Ability to understand verbal High instructions: Motivation Anxiety Level: Calm Cooperation: Cooperative Education Importance: Acknowledges Need Interest in Health Problems: Asks Questions Perception: Coherent Willingness to Engage in Self- High Management Activities: Readiness to Engage in Self- High Management Activities: Electronic Signature(s) Signed: 04/12/2019 4:36:06 PM By: Kela Millin Entered By: Kela Millin on 04/12/2019 10:45:16 -------------------------------------------------------------------------------- Fall Risk Assessment Details Patient Name: Date of Service: [name] Oman. 04/12/2019 10:30 AM Medical Record [id number] Patient Account Number: [id number] Date of Birth/Sex: Treating RN: [birth year] (86 y.o. [name] Primary Care Cinthia Rodden: Jerlyn Ly Other Clinician: Referring Yamir Carignan: Treating  Shakena Callari/Extender:Robson, Pollyann Kennedy, Vivianne Master in Treatment: 0 Fall Risk Assessment Items Have you had 2 or more falls in the last 12 monthso 0 Yes Have you had any fall that resulted in injury in the last 12 monthso 0 No FALLS RISK SCREEN History of falling - immediate or within 3 months 25 Yes Secondary diagnosis (Do you have 2 or more medical diagnoseso) 15 Yes Ambulatory aid None/bed rest/wheelchair/nurse 0 No Crutches/cane/walker 15 Yes Furniture 0 No Intravenous therapy Access/Saline/Heparin Lock 0 No Weak (short steps with or without shuffle, stooped but able to lift head 10 Yes while walking, may seek support from furniture) Impaired (short steps with shuffle, may have  difficulty arising from chair, 0 No head down, impaired balance) Mental Status Oriented to own ability 0 Yes Overestimates or forgets limitations 0 No Risk Level: High Risk Score: 65 Electronic Signature(s) Signed: 04/12/2019 4:36:06 PM By: Kela Millin Entered By: Kela Millin on 04/12/2019 10:46:10 -------------------------------------------------------------------------------- Foot Assessment Details Patient Name: Date of Service: [name] Oman. 04/12/2019 10:30 AM Medical Record [id number] Patient Account Number: [id number] Date of Birth/Sex: Treating RN: Jan 28, 1917 (86 y.o. [name] Primary Care Sequoia Mincey: Jerlyn Ly Other Clinician: Referring Chloris Marcoux: Treating Djuna Frechette/Extender:Robson, Pollyann Kennedy, Vivianne Master in Treatment: 0 Foot Assessment Items Site Locations + = Sensation present, - = Sensation absent, C = Callus, U = Ulcer R = Redness, W = Warmth, M = Maceration, PU = Pre-ulcerative lesion F = Fissure, S = Swelling, D = Dryness Assessment Right: Left: Other Deformity: No No Prior Foot Ulcer: No No Prior Amputation: No No Charcot Joint: No No Ambulatory Status: Ambulatory With Help Assistance Device: Walker Gait: Steady Electronic  Signature(s) Signed: 04/12/2019 4:36:06 PM By: Kela Millin Entered By: Kela Millin on 04/12/2019 10:46:52 -------------------------------------------------------------------------------- Nutrition Risk Screening Details Patient Name: Date of Service: [name] [name], [name] [name] 04/12/2019 10:30 AM Medical Record [id number] Patient Account Number: [id number] Date of Birth/Sex: Treating RN: 18-Jul-1917 (86 y.o. [name] Primary Care Lorenza Winkleman: Jerlyn Ly Other Clinician: Referring Alexsander Cavins: Treating Aeden Matranga/Extender:Robson, Pollyann Kennedy, Vivianne Master in Treatment: 0 Height (in): 67 Weight (lbs): 124 Body Mass Index (BMI): 19.4 Nutrition Risk Screening Items Score Screening NUTRITION RISK SCREEN: I have an illness or condition that made me change the kind and/or 2 Yes amount of food I eat I eat fewer than two meals per day 0 No I eat few fruits and vegetables, or milk products 0 No I have three or more drinks of beer, liquor or wine almost every day 0 No I have tooth or mouth problems that make it hard for me to eat 0 No I don't always have enough money to buy the food I need 0 No I eat alone most of the time 0 No I take three or more different prescribed or over-the-counter drugs a day 1 Yes 0 No Without wanting to, I have lost or gained 10 pounds in the last six months I am not always physically able to shop, cook and/or feed myself 0 No Nutrition Protocols Good Risk Protocol Provide education on Moderate Risk Protocol 0 nutrition High Risk Proctocol Risk Level: Moderate Risk Score: 3 Electronic Signature(s) Signed: 04/12/2019 4:36:06 PM By: Kela Millin Entered By: Kela Millin on 04/12/2019 10:46:35

## 2019-04-19 ENCOUNTER — Encounter (HOSPITAL_BASED_OUTPATIENT_CLINIC_OR_DEPARTMENT_OTHER): Payer: Medicare Other | Admitting: Internal Medicine

## 2019-04-19 ENCOUNTER — Other Ambulatory Visit: Payer: Self-pay

## 2019-04-19 DIAGNOSIS — I70243 Atherosclerosis of native arteries of left leg with ulceration of ankle: Secondary | ICD-10-CM | POA: Diagnosis not present

## 2019-04-19 DIAGNOSIS — I129 Hypertensive chronic kidney disease with stage 1 through stage 4 chronic kidney disease, or unspecified chronic kidney disease: Secondary | ICD-10-CM | POA: Diagnosis not present

## 2019-04-19 DIAGNOSIS — L97329 Non-pressure chronic ulcer of left ankle with unspecified severity: Secondary | ICD-10-CM | POA: Diagnosis not present

## 2019-04-19 DIAGNOSIS — E039 Hypothyroidism, unspecified: Secondary | ICD-10-CM | POA: Diagnosis not present

## 2019-04-19 DIAGNOSIS — E785 Hyperlipidemia, unspecified: Secondary | ICD-10-CM | POA: Diagnosis not present

## 2019-04-19 DIAGNOSIS — L89623 Pressure ulcer of left heel, stage 3: Secondary | ICD-10-CM | POA: Diagnosis not present

## 2019-04-19 NOTE — Progress Notes (Signed)
[name], [name] ([id number]) Visit Report for 04/19/2019 Debridement Details Patient Name: Date of Service: [name], [name] 04/19/2019 12:30 PM Medical Record [id number] Patient Account Number: [id number] Date of Birth/Sex: Treating RN: November 26, 1917 (86 y.o. [name] [name], Meta.Reding Primary Care Provider: Jerlyn Ly Other Clinician: Referring Provider: Treating Provider/Extender:Hayla Hinger, Pollyann Kennedy, Vivianne Master in Treatment: 1 Debridement Performed for Wound #3 Left,Lateral Calcaneus Assessment: Performed By: Physician Ricard Dillon., MD Debridement Type: Debridement Level of Consciousness (Pre- Awake and Alert procedure): Pre-procedure Yes - 13:38 Verification/Time Out Taken: Start Time: 13:39 Pain Control: Lidocaine 4% Topical Solution Total Area Debrided (L x W): 0.6 (cm) x 0.6 (cm) = 0.36 (cm) Tissue and other material Viable, Non-Viable, Subcutaneous, Skin: Dermis , Fibrin/Exudate debrided: Level: Skin/Subcutaneous Tissue Debridement Description: Excisional Instrument: Curette Bleeding: Minimum Hemostasis Achieved: Pressure End Time: 13:41 Procedural Pain: 0 Post Procedural Pain: 0 Response to Treatment: Procedure was tolerated well Level of Consciousness Awake and Alert (Post-procedure): Post Debridement Measurements of Total Wound Length: (cm) 0.6 Stage: Category/Stage III Width: (cm) 0.6 Depth: (cm) 0.1 Volume: (cm) 0.028 Character of Wound/Ulcer Post Improved Debridement: Post Procedure Diagnosis Same as Pre-procedure Electronic Signature(s) Signed: 04/19/2019 6:03:33 PM By: Linton Ham MD Signed: 04/19/2019 6:22:19 PM By: Deon Pilling Entered By: Linton Ham on 04/19/2019 14:02:14 -------------------------------------------------------------------------------- HPI Details Patient Name: Date of Service: [name] [name]. 04/19/2019 12:30 PM Medical Record [id number] Patient Account Number: [id number] Date of Birth/Sex: Treating  RN: [birth year] (86 y.o. [name] [name] Primary Care Provider: Jerlyn Ly Other Clinician: Referring Provider: Treating Provider/Extender:Lonnell Chaput, Pollyann Kennedy, Vivianne Master in Treatment: 1 History of Present Illness Location: Patient presents with an ulcer on the left lateral ankle. Quality: Patient reports No Pain. Severity: Patient states wound(s) are getting worse. Duration: Patient has had the wound for > 6 months prior to seeking treatment at the wound center Timing: Pain in wound is Intermittent (comes and goes Context: The wound would happen gradually Modifying Factors: the ulcer has been persistent after treatment for at least 3 months and at the present time collagen and has been Associated Signs and Symptoms: Patient reports presence of swelling HPI Description: Pleasant 86 year old patient who has no significant medical problems except mild high blood pressure, cardiac irregularity and generalized aches and pains. She presented to the podiatrist Dr. Melony Overly for a left lateral ankle ulceration and has been treated for about 3 months without resolution. The patient was referred to [place] for further care. The patient has not had any vascular study done except for a DVT study done about 2 years ago which was negative for DVT of the lower extremities. 09/11/2014 -- the patient had her vascular study done today but the reports are not ready yet. 09/18/2014 -- the ABI was not measured due to having noncompressible vessels due to medial calcification. However the bilateral tibial artery Doppler waveform in the TBI was normal and no further tests were recommended. 10/30/2014 -- she has developed a superficial abrasion just medial and superior to her previous wound. 11/13/2014 -- she has been authorized to get the tri-layer Oasis. She has done very well with her Hood last week. 12/18/2014 -- her wound has done very well but stalled a bit due to possible superimposed tape  burn. After cleaning the wound I believe it's time to use her tri-layer Oasis. 12/25/2014 -- She has had her Second application of Oasis. 01/01/2015 -- her wound looks excellent today and we will not use the next application of Oasis. 01/15/2015 --  she is here for review after 2 weeks due to scheduling issues. she has been doing the dressing as advised READMISSION 04/12/2019 Patient is now 86 year old woman. She was here in the wound care center in 2016 under the care of Dr. Con Memos with a left lateral ankle ulcer. Eventually this healed over however an area in the same location opened up at the end of January. She is known to have PAD and follow-up arterial studies were ordered on 3/12. This showed noncompressible ABIs bilaterally at 1.51. Great toe pressures were only 61 on the right and 65 on the left resulting in TBI's of 0.36 and 0.39 respectively. The patient apparently declined any invasive procedures including angiography. About a week ago she had another wound open up on the lateral part of her heel just above the weightbearing plantar part of the heel. It is felt that this was probably a pressure ulcer. The patient is not a diabetic or smoker. She is not complaining of a lot of pain. She does complain of the heel more than her lateral malleolus. Past medical history includes sick sinus syndrome on Eliquis, hypothyroidism, macular degeneration, hypertension, hyperlipidemia, stage II chronic renal failure apparently some history of malignancy involving her stomach 3/25; patient with known PAD who does not want any further consideration of interventions. She has areas on her left lateral malleolus and left lateral heel. Using silver collagen. She did not tolerate bunny boots to offload the heel so they have been using a long pillow. Her daughter is changing the dressings Electronic Signature(s) Signed: 04/19/2019 6:03:33 PM By: Linton Ham MD Entered By: Linton Ham on 04/19/2019  14:03:42 -------------------------------------------------------------------------------- Physical Exam Details Patient Name: Date of Service: [name] [name]. 04/19/2019 12:30 PM Medical Record [id number] Patient Account Number: [id number] Date of Birth/Sex: Treating RN: Dec 22, 1917 (86 y.o. [name] [name] Primary Care Provider: Jerlyn Ly Other Clinician: Referring Provider: Treating Provider/Extender:Francie Keeling, Pollyann Kennedy, Vivianne Master in Treatment: 1 Constitutional Patient is hypertensive.. Pulse regular and within target range for patient.Marland Kitchen Respirations regular, non-labored and within target range.. Temperature is normal and within the target range for the patient.Marland Kitchen Appears in no distress. Notes Wound exam; the area has a small wound on the left lateral malleolus. And a small wound on the left lateral heel. Used a #3 curette to debride the area on the left calcaneus. The area on the left malleolus is measuring smaller. Both wounds look healthy post debridement Electronic Signature(s) Signed: 04/19/2019 6:03:33 PM By: Linton Ham MD Entered By: Linton Ham on 04/19/2019 14:05:21 -------------------------------------------------------------------------------- Physician Orders Details Patient Name: Date of Service: [name] [name]. 04/19/2019 12:30 PM Medical Record [id number] Patient Account Number: [id number] Date of Birth/Sex: Treating RN: [birth year] (86 y.o. [name] [name] Primary Care Provider: Jerlyn Ly Other Clinician: Referring Provider: Treating Provider/Extender:Darshawn Boateng, Pollyann Kennedy, Vivianne Master in Treatment: 1 Verbal / Phone Orders: No Diagnosis Coding ICD-10 Coding Code Description [phone] Atherosclerosis of native arteries of left leg with ulceration of ankle L89.623 Pressure ulcer of left heel, stage 3 Follow-up Appointments Return Appointment in 2 weeks. Dressing Change Frequency Change Dressing every other day. Skin  Barriers/Peri-Wound Care Wound #2 Left,Lateral Malleolus Skin Prep Wound Cleansing May shower with protection. - protect the dressing when showering on days not changing the dressing. May shower and wash wound with soap and water. - with dressing changes only. Primary Wound Dressing Wound #2 Left,Lateral Malleolus Silver Collagen - moisten with hydrogel or KY jelly. Wound #3 Left,Lateral Calcaneus Silver Collagen - moisten  with hydrogel or KY jelly. Secondary Dressing Wound #3 Left,Lateral Calcaneus Kerlix/Rolled Gauze Dry Gauze Foam Border - or bordered gauze Heel Cup Edema Control Avoid standing for long periods of time Elevate legs to the level of the heart or above for 30 minutes daily and/or when sitting, a frequency of: - throughout the day. Off-Loading Open toe surgical [name] to: - left foot. Other: - Patient to wear bunny boot to aid in relieving pressure from left heel and ankle. Additional Orders / Instructions Follow Nutritious Diet - increase protein and vegetables to aid in wound healing. Electronic Signature(s) Signed: 04/19/2019 6:03:33 PM By: Linton Ham MD Signed: 04/19/2019 6:22:19 PM By: Deon Pilling Entered By: Deon Pilling on 04/19/2019 13:41:06 -------------------------------------------------------------------------------- Problem List Details Patient Name: Date of Service: [name] [name]. 04/19/2019 12:30 PM Medical Record [id number] Patient Account Number: [id number] Date of Birth/Sex: Treating RN: 09-09-1917 (86 y.o. [name] [name] Primary Care Provider: Jerlyn Ly Other Clinician: Referring Provider: Treating Provider/Extender:Rafeef Lau, Pollyann Kennedy, Vivianne Master in Treatment: 1 Active Problems ICD-10 Evaluated Encounter Code Description Active Date Today Diagnosis I70.243 Atherosclerosis of native arteries of left leg with 04/12/2019 No Yes ulceration of ankle L89.623 Pressure ulcer of left heel, stage 3 04/12/2019 No  Yes Inactive Problems Resolved Problems Electronic Signature(s) Signed: 04/19/2019 6:03:33 PM By: Linton Ham MD Entered By: Linton Ham on 04/19/2019 14:01:58 -------------------------------------------------------------------------------- Progress Note Details Patient Name: Date of Service: [name] [name]. 04/19/2019 12:30 PM Medical Record [id number] Patient Account Number: [id number] Date of Birth/Sex: Treating RN: 11-Feb-1917 (86 y.o. [name] [name] Primary Care Provider: Jerlyn Ly Other Clinician: Referring Provider: Treating Provider/Extender:Geneveive Furness, Pollyann Kennedy, Vivianne Master in Treatment: 1 Subjective History of Present Illness (HPI) The following HPI elements were documented for the patient's wound: Location: Patient presents with an ulcer on the left lateral ankle. Quality: Patient reports No Pain. Severity: Patient states wound(s) are getting worse. Duration: Patient has had the wound for > 6 months prior to seeking treatment at the wound center Timing: Pain in wound is Intermittent (comes and goes Context: The wound would happen gradually Modifying Factors: the ulcer has been persistent after treatment for at least 3 months and at the present time collagen and has been Associated Signs and Symptoms: Patient reports presence of swelling Pleasant 86 year old patient who has no significant medical problems except mild high blood pressure, cardiac irregularity and generalized aches and pains. She presented to the podiatrist Dr. Melony Overly for a left lateral ankle ulceration and has been treated for about 3 months without resolution. The patient was referred to [place] for further care. The patient has not had any vascular study done except for a DVT study done about 2 years ago which was negative for DVT of the lower extremities. 09/11/2014 -- the patient had her vascular study done today but the reports are not ready yet. 09/18/2014 -- the ABI was not measured  due to having noncompressible vessels due to medial calcification. However the bilateral tibial artery Doppler waveform in the TBI was normal and no further tests were recommended. 10/30/2014 -- she has developed a superficial abrasion just medial and superior to her previous wound. 11/13/2014 -- she has been authorized to get the tri-layer Oasis. She has done very well with her [place]land last week. 12/18/2014 -- her wound has done very well but stalled a bit due to possible superimposed tape burn. After cleaning the wound I believe it's time to use her tri-layer Oasis. 12/25/2014 -- She has had  her Second application of Oasis. 01/01/2015 -- her wound looks excellent today and we will not use the next application of Oasis. 01/15/2015 -- she is here for review after 2 weeks due to scheduling issues. she has been doing the dressing as advised READMISSION 04/12/2019 Patient is now 86 year old woman. She was here in the wound care center in 2016 under the care of Dr. Con Memos with a left lateral ankle ulcer. Eventually this healed over however an area in the same location opened up at the end of January. She is known to have PAD and follow-up arterial studies were ordered on 3/12. This showed noncompressible ABIs bilaterally at 1.51. Great toe pressures were only 61 on the right and 65 on the left resulting in TBI's of 0.36 and 0.39 respectively. The patient apparently declined any invasive procedures including angiography. About a week ago she had another wound open up on the lateral part of her heel just above the weightbearing plantar part of the heel. It is felt that this was probably a pressure ulcer. The patient is not a diabetic or smoker. She is not complaining of a lot of pain. She does complain of the heel more than her lateral malleolus. Past medical history includes sick sinus syndrome on Eliquis, hypothyroidism, macular degeneration, hypertension, hyperlipidemia, stage II  chronic renal failure apparently some history of malignancy involving her stomach 3/25; patient with known PAD who does not want any further consideration of interventions. She has areas on her left lateral malleolus and left lateral heel. Using silver collagen. She did not tolerate bunny boots to offload the heel so they have been using a long pillow. Her daughter is changing the dressings Objective Constitutional Patient is hypertensive.. Pulse regular and within target range for patient.Marland Kitchen Respirations regular, non-labored and within target range.. Temperature is normal and within the target range for the patient.Marland Kitchen Appears in no distress. Vitals Time Taken: 12:50 PM, Height: 67 in, Weight: 124 lbs, BMI: 19.4, Temperature: 98.1 F, Pulse: 68 bpm, Respiratory Rate: 19 breaths/min, Blood Pressure: 150/56 mmHg. General Notes: Wound exam; the area has a small wound on the left lateral malleolus. And a small wound on the left lateral heel. Used a #3 curette to debride the area on the left calcaneus. The area on the left malleolus is measuring smaller. Both wounds look healthy post debridement Integumentary (Hair, Skin) Wound #2 status is Open. Original cause of wound was Gradually Appeared. The wound is located on the Left,Lateral Malleolus. The wound measures 0.8cm length x 0.6cm width x 0.1cm depth; 0.377cm^2 area and 0.038cm^3 volume. There is Fat Layer (Subcutaneous Tissue) Exposed exposed. There is no tunneling or undermining noted. There is a small amount of serous drainage noted. The wound margin is distinct with the outline attached to the wound base. There is medium (34-66%) pink granulation within the wound bed. There is a medium (34-66%) amount of necrotic tissue within the wound bed including Adherent Slough. Wound #3 status is Open. Original cause of wound was Pressure Injury. The wound is located on the Left,Lateral Calcaneus. The wound measures 0.6cm length x 0.6cm width x 0.1cm  depth; 0.283cm^2 area and 0.028cm^3 volume. There is Fat Layer (Subcutaneous Tissue) Exposed exposed. There is no tunneling or undermining noted. There is a small amount of serosanguineous drainage noted. The wound margin is well defined and not attached to the wound base. There is medium (34-66%) pink, pale granulation within the wound bed. There is a medium (34- 66%) amount of necrotic tissue within the  wound bed including Adherent Slough. Assessment Active Problems ICD-10 Atherosclerosis of native arteries of left leg with ulceration of ankle Pressure ulcer of left heel, stage 3 Procedures Wound #3 Pre-procedure diagnosis of Wound #3 is a Pressure Ulcer located on the Left,Lateral Calcaneus . There was a Excisional Skin/Subcutaneous Tissue Debridement with a total area of 0.36 sq cm performed by Ricard Dillon., MD. With the following instrument(s): Curette to remove Viable and Non-Viable tissue/material. Material removed includes Subcutaneous Tissue, Skin: Dermis, and Fibrin/Exudate after achieving pain control using Lidocaine 4% Topical Solution. A time out was conducted at 13:38, prior to the start of the procedure. A Minimum amount of bleeding was controlled with Pressure. The procedure was tolerated well with a pain level of 0 throughout and a pain level of 0 following the procedure. Post Debridement Measurements: 0.6cm length x 0.6cm width x 0.1cm depth; 0.028cm^3 volume. Post debridement Stage noted as Category/Stage III. Character of Wound/Ulcer Post Debridement is improved. Post procedure Diagnosis Wound #3: Same as Pre-Procedure Plan Follow-up Appointments: Return Appointment in 2 weeks. Dressing Change Frequency: Change Dressing every other day. Skin Barriers/Peri-Wound Care: Wound #2 Left,Lateral Malleolus: Skin Prep Wound Cleansing: May shower with protection. - protect the dressing when showering on days not changing the dressing. May shower and wash wound with  soap and water. - with dressing changes only. Primary Wound Dressing: Wound #2 Left,Lateral Malleolus: Silver Collagen - moisten with hydrogel or KY jelly. Wound #3 Left,Lateral Calcaneus: Silver Collagen - moisten with hydrogel or KY jelly. Secondary Dressing: Wound #3 Left,Lateral Calcaneus: Kerlix/Rolled Gauze Dry Gauze Foam Border - or bordered gauze Heel Cup Edema Control: Avoid standing for long periods of time Elevate legs to the level of the heart or above for 30 minutes daily and/or when sitting, a frequency of: - throughout the day. Off-Loading: Open toe surgical [name] to: - left foot. Other: - Patient to wear bunny boot to aid in relieving pressure from left heel and ankle. Additional Orders / Instructions: Follow Nutritious Diet - increase protein and vegetables to aid in wound healing. 1. Continue with silver collagen kerlix and rolled gauze 2. It seems like they are offloading these areas properly. I think these are probably pressure ulcers however she has significant PAD Electronic Signature(s) Signed: 04/19/2019 6:03:33 PM By: Linton Ham MD Entered By: Linton Ham on 04/19/2019 14:06:09 -------------------------------------------------------------------------------- SuperBill Details Patient Name: Date of Service: [name] [name] 04/19/2019 Medical Record [id number] Patient Account Number: [id number] Date of Birth/Sex: Treating RN: 1917/05/16 (86 y.o. [name] [name], Tammi Klippel Primary Care Provider: Jerlyn Ly Other Clinician: Referring Provider: Treating Provider/Extender:Zidane Renner, Pollyann Kennedy, Vivianne Master in Treatment: 1 Diagnosis Coding ICD-10 Codes Code Description [phone] Atherosclerosis of native arteries of left leg with ulceration of ankle L89.623 Pressure ulcer of left heel, stage 3 Facility Procedures The patient participates with Medicare or their insurance follows the Medicare Facility Guidelines: CPT4 Code Description Modifier  Quantity [id number] - DEB SUBQ TISSUE 20 SQ CM/< 1 ICD-10 Diagnosis Description L89.623 Pressure ulcer of left heel,  stage 3 Physician Procedures CPT4 Code: [id number] Description: [id number] - WC PHYS SUBQ TISS 20 SQ CM ICD-10 Diagnosis Description L89.623 Pressure ulcer of left heel, stage 3 Modifier: Quantity: 1 Electronic Signature(s) Signed: 04/19/2019 6:03:33 PM By: Linton Ham MD Entered By: Linton Ham on 04/19/2019 14:06:22

## 2019-04-23 ENCOUNTER — Ambulatory Visit (INDEPENDENT_AMBULATORY_CARE_PROVIDER_SITE_OTHER): Payer: Medicare Other | Admitting: *Deleted

## 2019-04-23 DIAGNOSIS — I495 Sick sinus syndrome: Secondary | ICD-10-CM | POA: Diagnosis not present

## 2019-04-23 LAB — CUP PACEART REMOTE DEVICE CHECK
Battery Remaining Longevity: 115 mo
Battery Remaining Percentage: 95.5 %
Battery Voltage: 2.99 V
Brady Statistic AP VP Percent: 1 %
Brady Statistic AP VS Percent: 65 %
Brady Statistic AS VP Percent: 1 %
Brady Statistic AS VS Percent: 34 %
Brady Statistic RA Percent Paced: 65 %
Brady Statistic RV Percent Paced: 1 %
Date Time Interrogation Session: 20210329020015
Implantable Lead Implant Date: 20160421
Implantable Lead Implant Date: 20160421
Implantable Lead Location: 753859
Implantable Lead Location: 753860
Implantable Pulse Generator Implant Date: 20160421
Lead Channel Impedance Value: 360 Ohm
Lead Channel Impedance Value: 540 Ohm
Lead Channel Pacing Threshold Amplitude: 0.5 V
Lead Channel Pacing Threshold Amplitude: 1.25 V
Lead Channel Pacing Threshold Pulse Width: 0.5 ms
Lead Channel Pacing Threshold Pulse Width: 0.5 ms
Lead Channel Sensing Intrinsic Amplitude: 1 mV
Lead Channel Sensing Intrinsic Amplitude: 12 mV
Lead Channel Setting Pacing Amplitude: 2 V
Lead Channel Setting Pacing Amplitude: 2.5 V
Lead Channel Setting Pacing Pulse Width: 0.5 ms
Lead Channel Setting Sensing Sensitivity: 2 mV
Pulse Gen Model: 2240
Pulse Gen Serial Number: 7761558

## 2019-04-23 NOTE — Progress Notes (Signed)
PPM Remote  

## 2019-05-04 ENCOUNTER — Encounter (HOSPITAL_BASED_OUTPATIENT_CLINIC_OR_DEPARTMENT_OTHER): Payer: Medicare Other | Attending: Internal Medicine | Admitting: Internal Medicine

## 2019-05-04 ENCOUNTER — Other Ambulatory Visit: Payer: Self-pay

## 2019-05-04 DIAGNOSIS — I70243 Atherosclerosis of native arteries of left leg with ulceration of ankle: Secondary | ICD-10-CM | POA: Diagnosis not present

## 2019-05-04 DIAGNOSIS — H3530 Unspecified macular degeneration: Secondary | ICD-10-CM | POA: Diagnosis not present

## 2019-05-04 DIAGNOSIS — Z681 Body mass index (BMI) 19 or less, adult: Secondary | ICD-10-CM | POA: Insufficient documentation

## 2019-05-04 DIAGNOSIS — D649 Anemia, unspecified: Secondary | ICD-10-CM | POA: Insufficient documentation

## 2019-05-04 DIAGNOSIS — Z7901 Long term (current) use of anticoagulants: Secondary | ICD-10-CM | POA: Diagnosis not present

## 2019-05-04 DIAGNOSIS — I129 Hypertensive chronic kidney disease with stage 1 through stage 4 chronic kidney disease, or unspecified chronic kidney disease: Secondary | ICD-10-CM | POA: Diagnosis not present

## 2019-05-04 DIAGNOSIS — I495 Sick sinus syndrome: Secondary | ICD-10-CM | POA: Insufficient documentation

## 2019-05-04 DIAGNOSIS — L89623 Pressure ulcer of left heel, stage 3: Secondary | ICD-10-CM | POA: Insufficient documentation

## 2019-05-04 DIAGNOSIS — N182 Chronic kidney disease, stage 2 (mild): Secondary | ICD-10-CM | POA: Diagnosis not present

## 2019-05-04 DIAGNOSIS — E039 Hypothyroidism, unspecified: Secondary | ICD-10-CM | POA: Diagnosis not present

## 2019-05-04 DIAGNOSIS — E785 Hyperlipidemia, unspecified: Secondary | ICD-10-CM | POA: Insufficient documentation

## 2019-05-04 DIAGNOSIS — M1990 Unspecified osteoarthritis, unspecified site: Secondary | ICD-10-CM | POA: Insufficient documentation

## 2019-05-04 DIAGNOSIS — L97322 Non-pressure chronic ulcer of left ankle with fat layer exposed: Secondary | ICD-10-CM | POA: Diagnosis not present

## 2019-05-07 NOTE — Progress Notes (Signed)
[name] [name], [name] [name] ([id number]) Visit Report for 05/04/2019 HPI Details Patient Name: Date of Service: [name] [name], [name] [name] 05/04/2019 11:00 AM Medical Record [id number] Patient Account Number: [id number] Date of Birth/Sex: Treating RN: 04/19/1917 (86 y.o. [name] [name] Primary Care Provider: Jerlyn Ly Other Clinician: Referring Provider: Treating Provider/Extender:Deston Bilyeu, Pollyann Kennedy, Vivianne Master in Treatment: 3 History of Present Illness Location: Patient presents with an ulcer on the left lateral ankle. Quality: Patient reports No Pain. Severity: Patient states wound(s) are getting worse. Duration: Patient has had the wound for > 6 months prior to seeking treatment at the wound center Timing: Pain in wound is Intermittent (comes and goes Context: The wound would happen gradually Modifying Factors: the ulcer has been persistent after treatment for at least 3 months and at the present time collagen and has been Associated Signs and Symptoms: Patient reports presence of swelling HPI Description: Pleasant 86 year old patient who has no significant medical problems except mild high blood pressure, cardiac irregularity and generalized aches and pains. She presented to the podiatrist Dr. Melony Overly for a left lateral ankle ulceration and has been treated for about 3 months without resolution. The patient was referred to [place] for further care. The patient has not had any vascular study done except for a DVT study done about 2 years ago which was negative for DVT of the lower extremities. 09/11/2014 -- the patient had her vascular study done today but the reports are not ready yet. 09/18/2014 -- the ABI was not measured due to having noncompressible vessels due to medial calcification. However the bilateral tibial artery Doppler waveform in the TBI was normal and no further tests were recommended. 10/30/2014 -- she has developed a superficial abrasion just medial and superior to her  previous wound. 11/13/2014 -- she has been authorized to get the tri-layer Oasis. She has done very well with her [place] last week. 12/18/2014 -- her wound has done very well but stalled a bit due to possible superimposed tape burn. After cleaning the wound I believe it's time to use her tri-layer Oasis. 12/25/2014 -- She has had her Second application of Oasis. 01/01/2015 -- her wound looks excellent today and we will not use the next application of Oasis. 01/15/2015 -- she is here for review after 2 weeks due to scheduling issues. she has been doing the dressing as advised READMISSION 04/12/2019 Patient is now 86 year old woman. She was here in the wound care center in 2016 under the care of Dr. Con Memos with a left lateral ankle ulcer. Eventually this healed over however an area in the same location opened up at the end of January. She is known to have PAD and follow-up arterial studies were ordered on 3/12. This showed noncompressible ABIs bilaterally at 1.51. Great toe pressures were only 61 on the right and 65 on the left resulting in TBI's of 0.36 and 0.39 respectively. The patient apparently declined any invasive procedures including angiography. About a week ago she had another wound open up on the lateral part of her heel just above the weightbearing plantar part of the heel. It is felt that this was probably a pressure ulcer. The patient is not a diabetic or smoker. She is not complaining of a lot of pain. She does complain of the heel more than her lateral malleolus. Past medical history includes sick sinus syndrome on Eliquis, hypothyroidism, macular degeneration, hypertension, hyperlipidemia, stage II chronic renal failure apparently some history of malignancy involving her stomach 3/25; patient with known PAD  who does not want any further consideration of interventions. She has areas on her left lateral malleolus and left lateral heel. Using silver collagen. She did  not tolerate bunny boots to offload the heel so they have been using a long pillow. Her daughter is changing the dressings 4/9; patient with known PAD who does not want to pursue any further investigations. She is complaining of minimal pain. We have been using silver collagen to to wound areas 1 on the left lateral malleolus and the other on the left lateral heel just above the plantar surface. Her daughter is religiously offloading these areas especially can Electronic Signature(s) Signed: 05/07/2019 9:07:11 AM By: Linton Ham MD Entered By: Linton Ham on 05/04/2019 12:33:09 -------------------------------------------------------------------------------- Physical Exam Details Patient Name: Date of Service: [name] [name]. 05/04/2019 11:00 AM Medical Record [id number] Patient Account Number: [id number] Date of Birth/Sex: Treating RN: 1917-10-02 (86 y.o. [name] [name] Primary Care Provider: Jerlyn Ly Other Clinician: Referring Provider: Treating Provider/Extender:Debbrah Sampedro, Pollyann Kennedy, Vivianne Master in Treatment: 3 Constitutional Patient is hypertensive.. Pulse regular and within target range for patient.Marland Kitchen Respirations regular, non-labored and within target range.. Temperature is normal and within the target range for the patient.Marland Kitchen Appears in no distress. Cardiovascular Pedal pulses on the left are not palpable however popliteal and femoral pulses are palpable. Integumentary (Hair, Skin) No erythema around either wound. Notes Wound exam; the area has a small wound on the left lateral malleolus and a small wound on the left lateral heel just above the plantar surface. The area on the heel looks smaller but the area on the malleolus is not. No debridement is required in either area. Electronic Signature(s) Signed: 05/07/2019 9:07:11 AM By: Linton Ham MD Entered By: Linton Ham on 05/04/2019  12:35:42 -------------------------------------------------------------------------------- Physician Orders Details Patient Name: Date of Service: [name] [name]. 05/04/2019 11:00 AM Medical Record [id number] Patient Account Number: [id number] Date of Birth/Sex: Treating RN: December 16, 1917 (86 y.o. [name] [name] Primary Care Provider: Jerlyn Ly Other Clinician: Referring Provider: Treating Provider/Extender:Jshawn Hurta, Pollyann Kennedy, Vivianne Master in Treatment: 3 Verbal / Phone Orders: No Diagnosis Coding ICD-10 Coding Code Description [phone] Atherosclerosis of native arteries of left leg with ulceration of ankle L89.623 Pressure ulcer of left heel, stage 3 Follow-up Appointments Return appointment in 3 weeks. Dressing Change Frequency Change Dressing every other day. Skin Barriers/Peri-Wound Care Wound #2 Left,Lateral Malleolus Skin Prep Wound Cleansing May shower with protection. - protect the dressing when showering on days not changing the dressing. May shower and wash wound with soap and water. - with dressing changes only. Primary Wound Dressing Wound #2 Left,Lateral Malleolus Silver Collagen - moisten with hydrogel or KY jelly. Wound #3 Left,Lateral Calcaneus Silver Collagen - moisten with hydrogel or KY jelly. Secondary Dressing Wound #3 Left,Lateral Calcaneus Kerlix/Rolled Gauze Dry Gauze Foam Border - or bordered gauze Heel Cup Edema Control Avoid standing for long periods of time Elevate legs to the level of the heart or above for 30 minutes daily and/or when sitting, a frequency of: - throughout the day. Off-Loading Open toe surgical shoe to: - left foot. Other: - Patient to wear bunny boot to aid in relieving pressure from left heel and ankle. Additional Orders / Instructions Follow Nutritious Diet - increase protein and vegetables to aid in wound healing. Electronic Signature(s) Signed: 05/04/2019 6:04:52 PM By: Kela Millin Signed:  05/07/2019 9:07:11 AM By: Linton Ham MD Entered By: Kela Millin on 05/04/2019 12:12:16 -------------------------------------------------------------------------------- Problem List Details Patient Name: Date of Service:  [name] [name], [name] M. 05/04/2019 11:00 AM Medical Record [id number] Patient Account Number: [id number] Date of Birth/Sex: Treating RN: 09-May-1917 (86 y.o. [name] [name] Primary Care Provider: Jerlyn Ly Other Clinician: Referring Provider: Treating Provider/Extender:Geana Walts, Pollyann Kennedy, Vivianne Master in Treatment: 3 Active Problems ICD-10 Evaluated Encounter Code Description Active Date Today Diagnosis I70.243 Atherosclerosis of native arteries of left leg with 04/12/2019 No Yes ulceration of ankle L89.623 Pressure ulcer of left heel, stage 3 04/12/2019 No Yes Inactive Problems Resolved Problems Electronic Signature(s) Signed: 05/07/2019 9:07:11 AM By: Linton Ham MD Entered By: Linton Ham on 05/04/2019 12:32:16 -------------------------------------------------------------------------------- Progress Note Details Patient Name: Date of Service: [name] [name]. 05/04/2019 11:00 AM Medical Record [id number] Patient Account Number: [id number] Date of Birth/Sex: Treating RN: [birth year] (86 y.o. [name] [name] Primary Care Provider: Jerlyn Ly Other Clinician: Referring Provider: Treating Provider/Extender:Zinia Innocent, Pollyann Kennedy, Vivianne Master in Treatment: 3 Subjective History of Present Illness (HPI) The following HPI elements were documented for the patient's wound: Location: Patient presents with an ulcer on the left lateral ankle. Quality: Patient reports No Pain. Severity: Patient states wound(s) are getting worse. Duration: Patient has had the wound for > 6 months prior to seeking treatment at the wound center Timing: Pain in wound is Intermittent (comes and goes Context: The wound would happen  gradually Modifying Factors: the ulcer has been persistent after treatment for at least 3 months and at the present time collagen and has been Associated Signs and Symptoms: Patient reports presence of swelling Pleasant 86 year old patient who has no significant medical problems except mild high blood pressure, cardiac irregularity and generalized aches and pains. She presented to the podiatrist Dr. Melony Overly for a left lateral ankle ulceration and has been treated for about 3 months without resolution. The patient was referred to [place] for further care. The patient has not had any vascular study done except for a DVT study done about 2 years ago which was negative for DVT of the lower extremities. 09/11/2014 -- the patient had her vascular study done today but the reports are not ready yet. 09/18/2014 -- the ABI was not measured due to having noncompressible vessels due to medial calcification. However the bilateral tibial artery Doppler waveform in the TBI was normal and no further tests were recommended. 10/30/2014 -- she has developed a superficial abrasion just medial and superior to her previous wound. 11/13/2014 -- she has been authorized to get the tri-layer Oasis. She has done very well with her [place] last week. 12/18/2014 -- her wound has done very well but stalled a bit due to possible superimposed tape burn. After cleaning the wound I believe it's time to use her tri-layer Oasis. 12/25/2014 -- She has had her Second application of Oasis. 01/01/2015 -- her wound looks excellent today and we will not use the next application of Oasis. 01/15/2015 -- she is here for review after 2 weeks due to scheduling issues. she has been doing the dressing as advised READMISSION 04/12/2019 Patient is now 86 year old woman. She was here in the wound care center in 2016 under the care of Dr. Con Memos with a left lateral ankle ulcer. Eventually this healed over however an area in the same  location opened up at the end of January. She is known to have PAD and follow-up arterial studies were ordered on 3/12. This showed noncompressible ABIs bilaterally at 1.51. Great toe pressures were only 61 on the right and 65 on the left resulting in TBI's of 0.36 and  0.39 respectively. The patient apparently declined any invasive procedures including angiography. About a week ago she had another wound open up on the lateral part of her heel just above the weightbearing plantar part of the heel. It is felt that this was probably a pressure ulcer. The patient is not a diabetic or smoker. She is not complaining of a lot of pain. She does complain of the heel more than her lateral malleolus. Past medical history includes sick sinus syndrome on Eliquis, hypothyroidism, macular degeneration, hypertension, hyperlipidemia, stage II chronic renal failure apparently some history of malignancy involving her stomach 3/25; patient with known PAD who does not want any further consideration of interventions. She has areas on her left lateral malleolus and left lateral heel. Using silver collagen. She did not tolerate bunny boots to offload the heel so they have been using a long pillow. Her daughter is changing the dressings 4/9; patient with known PAD who does not want to pursue any further investigations. She is complaining of minimal pain. We have been using silver collagen to to wound areas 1 on the left lateral malleolus and the other on the left lateral heel just above the plantar surface. Her daughter is religiously offloading these areas especially can Objective Constitutional Patient is hypertensive.. Pulse regular and within target range for patient.Marland Kitchen Respirations regular, non-labored and within target range.. Temperature is normal and within the target range for the patient.Marland Kitchen Appears in no distress. Vitals Time Taken: 11:50 AM, Height: 67 in, Source: Stated, Weight: 124 lbs, Source: Stated, BMI:  19.4, Temperature: 98.3 F, Pulse: 60 bpm, Respiratory Rate: 18 breaths/min, Blood Pressure: 163/69 mmHg. Cardiovascular Pedal pulses on the left are not palpable however popliteal and femoral pulses are palpable. General Notes: Wound exam; the area has a small wound on the left lateral malleolus and a small wound on the left lateral heel just above the plantar surface. The area on the heel looks smaller but the area on the malleolus is not. No debridement is required in either area. Integumentary (Hair, Skin) No erythema around either wound. Wound #2 status is Open. Original cause of wound was Gradually Appeared. The wound is located on the Left,Lateral Malleolus. The wound measures 0.8cm length x 1.3cm width x 0.1cm depth; 0.817cm^2 area and 0.082cm^3 volume. There is Fat Layer (Subcutaneous Tissue) Exposed exposed. There is no tunneling or undermining noted. There is a small amount of serosanguineous drainage noted. The wound margin is distinct with the outline attached to the wound base. There is large (67-100%) pink granulation within the wound bed. There is a small (1-33%) amount of necrotic tissue within the wound bed including Adherent Slough. Wound #3 status is Open. Original cause of wound was Pressure Injury. The wound is located on the Left,Lateral Calcaneus. The wound measures 0.5cm length x 0.3cm width x 0.1cm depth; 0.118cm^2 area and 0.012cm^3 volume. There is Fat Layer (Subcutaneous Tissue) Exposed exposed. There is no tunneling or undermining noted. There is a small amount of serous drainage noted. The wound margin is flat and intact. There is small (1-33%) pink, pale granulation within the wound bed. There is a large (67-100%) amount of necrotic tissue within the wound bed including Adherent Slough. Assessment Active Problems ICD-10 Atherosclerosis of native arteries of left leg with ulceration of ankle Pressure ulcer of left heel, stage 3 Plan Follow-up  Appointments: Return appointment in 3 weeks. Dressing Change Frequency: Change Dressing every other day. Skin Barriers/Peri-Wound Care: Wound #2 Left,Lateral Malleolus: Skin Prep Wound Cleansing: May shower  with protection. - protect the dressing when showering on days not changing the dressing. May shower and wash wound with soap and water. - with dressing changes only. Primary Wound Dressing: Wound #2 Left,Lateral Malleolus: Silver Collagen - moisten with hydrogel or KY jelly. Wound #3 Left,Lateral Calcaneus: Silver Collagen - moisten with hydrogel or KY jelly. Secondary Dressing: Wound #3 Left,Lateral Calcaneus: Kerlix/Rolled Gauze Dry Gauze Foam Border - or bordered gauze Heel Cup Edema Control: Avoid standing for long periods of time Elevate legs to the level of the heart or above for 30 minutes daily and/or when sitting, a frequency of: - throughout the day. Off-Loading: Open toe surgical shoe to: - left foot. Other: - Patient to wear bunny boot to aid in relieving pressure from left heel and ankle. Additional Orders / Instructions: Follow Nutritious Diet - increase protein and vegetables to aid in wound healing. 1. We will continue with silver collagen to both wound areas 2. The heel looks like it is contracting however the area of the medial malleolus did not albeit more superficial. 3. If I can get the heel to close I may change to [facility] to the area on the medial malleolus 4. The patient has PAD but does not wish any aggressive investigations. Nevertheless she does not appear to be symptomatic in any major way I can tell Electronic Signature(s) Signed: 05/07/2019 9:07:11 AM By: Linton Ham MD Entered By: Linton Ham on 05/04/2019 12:39:35 -------------------------------------------------------------------------------- SuperBill Details Patient Name: Date of Service: [name] [name]. 05/04/2019 Medical Record [id number] Patient Account Number:  [id number] Date of Birth/Sex: Treating RN: 05-21-1917 (86 y.o. [name] [name] Primary Care Provider: Jerlyn Ly Other Clinician: Referring Provider: Treating Provider/Extender:Idolina Mantell, Pollyann Kennedy, Vivianne Master in Treatment: 3 Diagnosis Coding ICD-10 Codes Code Description [phone] Atherosclerosis of native arteries of left leg with ulceration of ankle L89.623 Pressure ulcer of left heel, stage 3 Facility Procedures The patient participates with Medicare or their insurance follows the Medicare Facility Guidelines: CPT4 Code Description Modifier Quantity [id number] [place] VISIT-LEV 4 EST PT 1 Physician Procedures CPT4 Code Description: [id number] - WC PHYS LEVEL 3 - EST PT ICD-10 Diagnosis Description I70.243 Atherosclerosis of native arteries of left leg with ulceratio L89.623 Pressure ulcer of left heel, stage 3 Modifier: n of ankle Quantity: 1 Electronic Signature(s) Signed: 05/04/2019 6:04:52 PM By: Kela Millin Signed: 05/07/2019 9:07:11 AM By: Linton Ham MD Entered By: Kela Millin on 05/04/2019 12:49:39

## 2019-05-07 NOTE — Progress Notes (Signed)
ALIRA, SQUICCIARINI ([id number]) Visit Report for 05/04/2019 Arrival Information Details Patient Name: Date of Service: JANNELY, KIRIN 05/04/2019 11:00 AM Medical Record [id number] Patient Account Number: [id number] Date of Birth/Sex: Treating RN: 21-Jul-1917 (86 y.o. [name] Primary Care Lasya Vetter: Jerlyn Ly Other Clinician: Referring Chardonnay Holzmann: Treating Kevina Piloto/Extender:Robson, Pollyann Kennedy, Vivianne Master in Treatment: 3 Visit Information History Since Last Visit Added or deleted any No Patient Arrived: Wheel Chair medications: Arrival Time: 11:44 Any new allergies or adverse No Accompanied By: Charolett Bumpers reactions: Transfer Assistance: None Had a fall or experienced change No Patient Identification Verified: Yes in Secondary Verification Process Yes activities of daily living that may Completed: affect Patient Has Alerts: Yes risk of falls: Patient Alerts: Patient on Blood Signs or symptoms of No Thinner abuse/neglect since last visito ABI L: 1.5 Hospitalized since last visit: No TBI L: 0.36 Implantable device outside of the No clinic excluding cellular tissue based products placed in the center since last visit: Has Dressing in Place as Yes Prescribed: Has Footwear/Offloading in Place Yes as Prescribed: Left: Surgical Shoe with Pressure Relief Insole Pain Present Now: No Electronic Signature(s) Signed: 05/04/2019 6:10:03 PM By: Baruch Gouty RN, BSN Entered By: Baruch Gouty on 05/04/2019 11:50:04 -------------------------------------------------------------------------------- Clinic Level of Care Assessment Details Patient Name: Date of Service: LARRIE, HAFFNER. 05/04/2019 11:00 AM Medical Record [id number] Patient Account Number: [id number] Date of Birth/Sex: Treating RN: 06/23/1917 (86 y.o. F) Dwiggins, Larene Beach Primary Care Shrey Boike: Other Clinician: Jerlyn Ly Referring Miran Kautzman: Treating Mykira Hofmeister/Extender:Robson, Pollyann Kennedy,  Vivianne Master in Treatment: 3 Clinic Level of Care Assessment Items TOOL 4 Quantity Score X - Use when only an EandM is performed on FOLLOW-UP visit 1 0 ASSESSMENTS - Nursing Assessment / Reassessment X - Reassessment of Co-morbidities (includes updates in patient status) 1 10 X - Reassessment of Adherence to Treatment Plan 1 5 ASSESSMENTS - Wound and Skin Assessment / Reassessment []  - Simple Wound Assessment / Reassessment - one wound 0 X - Complex Wound Assessment / Reassessment - multiple wounds 2 5 []  - Dermatologic / Skin Assessment (not related to wound area) 0 ASSESSMENTS - Focused Assessment X - Circumferential Edema Measurements - multi extremities 1 5 []  - Nutritional Assessment / Counseling / Intervention 0 []  - Lower Extremity Assessment (monofilament, tuning fork, pulses) 0 []  - Peripheral Arterial Disease Assessment (using hand held doppler) 0 ASSESSMENTS - Ostomy and/or Continence Assessment and Care []  - Incontinence Assessment and Management 0 []  - Ostomy Care Assessment and Management (repouching, etc.) 0 PROCESS - Coordination of Care X - Simple Patient / Family Education for ongoing care 1 15 []  - Complex (extensive) Patient / Family Education for ongoing care 0 X - Staff obtains Programmer, systems, Records, Test Results / Process Orders 1 10 []  - Staff telephones HHA, Nursing Homes / Clarify orders / etc 0 []  - Routine Transfer to another Facility (non-emergent condition) 0 []  - Routine Hospital Admission (non-emergent condition) 0 []  - New Admissions / Biomedical engineer / Ordering NPWT, Apligraf, etc. 0 []  - Emergency Hospital Admission (emergent condition) 0 X - Simple Discharge Coordination 1 10 []  - Complex (extensive) Discharge Coordination 0 PROCESS - Special Needs []  - Pediatric / Minor Patient Management 0 []  - Isolation Patient Management 0 []  - Hearing / Language / Visual special needs 0 []  - Assessment of Community assistance (transportation, D/C  planning, etc.) 0 []  - Additional assistance / Altered mentation 0 []  - Support Surface(s) Assessment (bed, cushion, seat, etc.) 0 INTERVENTIONS -  Wound Cleansing / Measurement []  - Simple Wound Cleansing - one wound 0 X - Complex Wound Cleansing - multiple wounds 2 5 X - Wound Imaging (photographs - any number of wounds) 1 5 []  - Wound Tracing (instead of photographs) 0 []  - Simple Wound Measurement - one wound 0 X - Complex Wound Measurement - multiple wounds 2 5 INTERVENTIONS - Wound Dressings X - Small Wound Dressing one or multiple wounds 2 10 []  - Medium Wound Dressing one or multiple wounds 0 []  - Large Wound Dressing one or multiple wounds 0 X - Application of Medications - topical 1 5 []  - Application of Medications - injection 0 INTERVENTIONS - Miscellaneous []  - External ear exam 0 []  - Specimen Collection (cultures, biopsies, blood, body fluids, etc.) 0 []  - Specimen(s) / Culture(s) sent or taken to Lab for analysis 0 []  - Patient Transfer (multiple staff / Civil Service fast streamer / Similar devices) 0 []  - Simple Staple / Suture removal (25 or less) 0 []  - Complex Staple / Suture removal (26 or more) 0 []  - Hypo / Hyperglycemic Management (close monitor of Blood Glucose) 0 []  - Ankle / Brachial Index (ABI) - do not check if billed separately 0 X - Vital Signs 1 5 Has the patient been seen at the hospital within the last three years: Yes Total Score: 120 Level Of Care: New/Established - Level 4 Electronic Signature(s) Signed: 05/04/2019 6:04:52 PM By: Kela Millin Entered By: Kela Millin on 05/04/2019 12:49:25 -------------------------------------------------------------------------------- Encounter Discharge Information Details Patient Name: Date of Service: [name] 05/04/2019 11:00 AM Medical Record [id number] Patient Account Number: [id number] Date of Birth/Sex: Treating RN: 1917-12-31 (86 y.o. [name] Primary Care Launi Asencio: Jerlyn Ly Other  Clinician: Referring Kristion Holifield: Treating Dequann Vandervelden/Extender:Robson, Pollyann Kennedy, Vivianne Master in Treatment: 3 Encounter Discharge Information Items Discharge Condition: Stable Ambulatory Status: Wheelchair Discharge Destination: Home Transportation: Private Auto Accompanied By: daughter Schedule Follow-up Appointment: Yes Clinical Summary of Care: Electronic Signature(s) Signed: 05/04/2019 5:55:58 PM By: Deon Pilling Entered By: Deon Pilling on 05/04/2019 12:29:54 -------------------------------------------------------------------------------- Lower Extremity Assessment Details Patient Name: Date of Service: ABBEYGAIL, TEICHMANN. 05/04/2019 11:00 AM Medical Record [id number] Patient Account Number: [id number] Date of Birth/Sex: Treating RN: 1917/11/10 (86 y.o. [name] Primary Care Cayci Mcnabb: Jerlyn Ly Other Clinician: Referring Stela Iwasaki: Treating Ronda Kazmi/Extender:Robson, Pollyann Kennedy, Vivianne Master in Treatment: 3 Edema Assessment Assessed: [Left: No] [Right: No] Edema: [Left: Ye] [Right: s] Calf Left: Right: Point of Measurement: cm From Medial Instep 29.7 cm cm Ankle Left: Right: Point of Measurement: cm From Medial Instep 20.3 cm cm Vascular Assessment Pulses: Dorsalis Pedis Palpable: [Left:No] Electronic Signature(s) Signed: 05/04/2019 6:10:03 PM By: Baruch Gouty RN, BSN Entered By: Baruch Gouty on 05/04/2019 11:54:31 -------------------------------------------------------------------------------- Multi Wound Chart Details Patient Name: Date of Service: [name] 05/04/2019 11:00 AM Medical Record [id number] Patient Account Number: [id number] Date of Birth/Sex: Treating RN: [birth year] (86 y.o. 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] [N/A:N/A] Comorbid History: [2:Cataracts, Chronic sinus Cataracts, Chronic sinus N/A problems/congestion, Anemia, Angina, Arrhythmia, Hypertension, Arrhythmia, Hypertension, Peripheral Arterial Disease, Peripheral Arterial Disease, Peripheral Venous Disease,  Peripheral Venous Disease, Osteoarthritis, Received Chemotherapy] [3:problems/congestion, Anemia, Angina, Osteoarthritis, Received Chemotherapy] Date Acquired: [2:02/22/2019] [3:04/04/2019] [N/A:N/A] Weeks of Treatment: [2:3] [3:3] [N/A:N/A] Wound Status: [2:Open] [3:Open] [N/A:N/A] Measurements L x W x D 0.8x1.3x0.1 [3:0.5x0.3x0.1] [N/A:N/A] (cm) Area (cm) : [2:0.817] [3:0.118] [N/A:N/A] Volume (cm) : [2:0.082] [3:0.012] [N/A:N/A] % Reduction in Area: [2:49.00%] [3:28.50%] [N/A:N/A] % Reduction in Volume: [2:48.80%] [3:63.60%] [N/A:N/A] Classification: [2:Full Thickness Without Exposed Support Structures] [3:Category/Stage III] [N/A:N/A] Exudate Amount: [2:Small] [3:Small] [N/A:N/A] Exudate Type: [2:Serosanguineous] [3:Serous] [N/A:N/A] Exudate Color: [2:red, brown] [3:amber] [N/A:N/A] Wound Margin: [2:Distinct, outline attached Flat and Intact] [N/A:N/A] Granulation Amount: [2:Large (67-100%)] [3:Small (1-33%)] [N/A:N/A] Granulation Quality: [2:Pink] [3:Pink, Pale] [N/A:N/A] Necrotic Amount: [2:Small (1-33%)] [3:Large (67-100%)] [N/A:N/A] Exposed Structures: [2:Fat Layer (Subcutaneous Fat Layer (Subcutaneous Tissue) Exposed: Yes Fascia: No Tendon: No Muscle: No Joint: No Bone: No Small (1-33%)] [3:Tissue) Exposed: Yes Fascia: No Tendon: No Muscle: No Joint:  No Bone: No None] [N/A:N/A N/A] Treatment Notes Wound #2 (Left, Lateral Malleolus) 1. Cleanse With Wound Cleanser 3. Primary Dressing Applied Collegen AG Hydrogel or K-Y Jelly 4. Secondary Dressing Foam Border Dressing 5. Secured With Self Adhesive Bandage Wound #3 (Left, Lateral Calcaneus) 1. Cleanse With Wound Cleanser 3. Primary Dressing Applied Collegen AG Hydrogel or K-Y Jelly 4. Secondary Dressing Dry Gauze Roll Gauze Heel Cup 5. Secured With Medipore tape 7. Footwear/Offloading device applied Surgical shoe Electronic Signature(s) Signed: 05/04/2019 6:04:52 PM By: Kela Millin Signed: 05/07/2019 9:07:11 AM By: Linton Ham MD Entered By: Linton Ham on 05/04/2019 12:32:24 -------------------------------------------------------------------------------- Multi-Disciplinary Care Plan Details Patient Name: Date of Service: [name] 05/04/2019 11:00 AM Medical Record [id number] Patient Account Number: [id number] Date of Birth/Sex: Treating RN: 21-May-1917 (86 y.o. [name] Primary Care Rufina Kimery: Jerlyn Ly Other Clinician: Referring Quantay Zaremba: Treating Bruchy Mikel/Extender:Robson, Pollyann Kennedy, Vivianne Master in Treatment: 3 Active Inactive Abuse / Safety / Falls / Self Care Management Nursing Diagnoses: Potential for falls Goals: Patient will remain injury free related to falls Date Initiated: 04/12/2019 Target Resolution Date: 05/25/2019 Goal Status: Active Patient/caregiver will verbalize understanding of skin care regimen Date Initiated: 04/12/2019 Target Resolution Date: 05/25/2019 Goal Status: Active Interventions: Provide education on fall prevention Notes: Nutrition Nursing Diagnoses: Potential for alteratiion in Nutrition/Potential for imbalanced nutrition Goals: Patient/caregiver agrees to and verbalizes understanding of need to obtain nutritional consultation Date Initiated: 04/12/2019 Target Resolution Date:  05/25/2019 Goal Status: Active Interventions: Provide education on nutrition Treatment Activities: Patient referred to Primary Care Physician for further nutritional evaluation : 04/12/2019 Notes: Wound/Skin Impairment Nursing Diagnoses: Knowledge deficit related to ulceration/compromised skin integrity Goals: Ulcer/skin breakdown will heal within 14 weeks Date Initiated: 04/12/2019 Target Resolution Date: 08/03/2019 Goal Status: Active Interventions: Assess patient/caregiver ability to obtain necessary supplies Assess patient/caregiver ability to perform ulcer/skin care regimen upon admission and as needed Provide education on ulcer and skin care Treatment Activities: Skin care regimen initiated : 04/12/2019 Topical wound management initiated : 04/12/2019 Notes: Electronic Signature(s) Signed: 05/04/2019 6:04:52 PM By: Kela Millin Entered By: Kela Millin on 05/04/2019 11:59:17 -------------------------------------------------------------------------------- Pain Assessment Details Patient Name: Date of Service: [name] 05/04/2019 11:00 AM Medical Record [id number] Patient Account Number: [id number] Date of Birth/Sex: Treating RN: 01-Feb-1917 (86 y.o. [name] Primary Care Elex Mainwaring: Jerlyn Ly Other Clinician: Referring Angelino Rumery: Treating Pellegrino Kennard/Extender:Robson, Pollyann Kennedy, Vivianne Master in Treatment: 3 Active Problems Location of Pain Severity and Description of Pain Patient Has Paino No Site Locations Rate the pain. Current Pain Level: 0 Pain Management and Medication Current Pain Management: Electronic Signature(s) Signed:  05/04/2019 6:10:03 PM By: Baruch Gouty RN, BSN Entered By: Baruch Gouty on 05/04/2019 11:50:37 -------------------------------------------------------------------------------- Patient/Caregiver Education Details Patient Name: Date of Service: Smylie, Amour M. 4/9/2021andnbsp11:00 AM Medical Record  [id number] Patient Account Number: [id number] Date of Birth/Gender: Treating RN: Nov 06, 1917 (86 y.o. [name] Primary Care Physician: Jerlyn Ly Other Clinician: Referring Physician: Treating Physician/Extender:Robson, Pollyann Kennedy, Vivianne Master in Treatment: 3 Education Assessment Education Provided To: Patient Education Topics Provided Nutrition: Methods: Explain/Verbal Responses: State content correctly Safety: Methods: Explain/Verbal Responses: State content correctly Wound/Skin Impairment: Methods: Explain/Verbal Responses: State content correctly Electronic Signature(s) Signed: 05/04/2019 6:04:52 PM By: Kela Millin Entered By: Kela Millin on 05/04/2019 11:59:36 -------------------------------------------------------------------------------- Wound Assessment Details Patient Name: Date of Service: [name] 05/04/2019 11:00 AM Medical Record [id number] Patient Account Number: [id number] Date of Birth/Sex: Treating RN: September 26, 1917 (86 y.o. [name] Primary Care Kaleeah Gingerich: Jerlyn Ly Other Clinician: Referring Charlotte Fidalgo: Treating Echo Propp/Extender:Robson, Pollyann Kennedy, Vivianne Master in Treatment: 3 Wound Status Wound Number: 2 Primary Arterial Insufficiency Ulcer Etiology: Wound Location: Left, Lateral Malleolus Wound Open Wounding Event: Gradually Appeared Status: Date Acquired: 02/22/2019 Comorbid Cataracts, Chronic sinus problems/congestion, Weeks Of Treatment: 3 History: Anemia, Angina, Arrhythmia, Hypertension, Clustered Wound: No Peripheral Arterial Disease, Peripheral Venous Disease, Osteoarthritis, Received Chemotherapy Wound Measurements Length: (cm) 0.8 % Reduct Width: (cm) 1.3 % Reduct Depth: (cm) 0.1 Epitheli Area: (cm) 0.817 Tunneli Volume: (cm) 0.082 Undermi Wound Description Full Thickness Without Exposed Support Classification: Structures Wound Distinct, outline  attached Margin: Exudate Small Amount: Exudate Serosanguineous Type: Exudate red, brown Color: Wound Bed Granulation Amount: Large (67-100%) Granulation Quality: Pink Necrotic Amount: Small (1-33%) Necrotic Quality: Adherent Slough Foul Odor After Cleansing: No Slough/Fibrino Yes Exposed Structure Fascia Exposed: No Fat Layer (Subcutaneous Tissue) Exposed: Yes Tendon Exposed: No Muscle Exposed: No Joint Exposed: No Bone Exposed: No ion in Area: 49% ion in Volume: 48.8% alization: Small (1-33%) ng: No ning: No Treatment Notes Wound #2 (Left, Lateral Malleolus) 1. Cleanse With Wound Cleanser 3. Primary Dressing Applied Collegen AG Hydrogel or K-Y Jelly 4. Secondary Dressing Foam Border Dressing 5. Secured With Office manager) Signed: 05/04/2019 6:10:03 PM By: Baruch Gouty RN, BSN Entered By: Baruch Gouty on 05/04/2019 11:59:42 -------------------------------------------------------------------------------- Wound Assessment Details Patient Name: Date of Service: [name] 05/04/2019 11:00 AM Medical Record [id number] Patient Account Number: [id number] Date of Birth/Sex: Treating RN: 05-19-1917 (86 y.o. [name] Primary Care Kloe Oates: Jerlyn Ly Other Clinician: Referring Sherra Kimmons: Treating Eadie Repetto/Extender:Robson, Pollyann Kennedy, Vivianne Master in Treatment: 3 Wound Status Wound Number: 3 Primary Pressure Ulcer Etiology: Wound Location: Left, Lateral Calcaneus Wound Open Wounding Event: Pressure Injury Status: Date Acquired: 04/04/2019 Comorbid Cataracts, Chronic sinus problems/congestion, Weeks Of Treatment: 3 History: Anemia, Angina, Arrhythmia, Hypertension, Clustered Wound: No Peripheral Arterial Disease, Peripheral Venous Disease, Osteoarthritis, Received Chemotherapy Wound Measurements Length: (cm) 0.5 % Reduction in A Width: (cm) 0.3 % Reduction in V Depth: (cm) 0.1  Epithelializatio Area: (cm) 0.118 Tunneling: Volume: (cm) 0.012 Undermining: Wound Description Classification: Category/Stage III Foul Odor After Wound Margin: Flat and Intact Slough/Fibrino Exudate Amount: Small Exudate Type: Serous Exudate Color: amber Wound Bed Granulation Amount: Small (1-33%) Granulation Quality: Pink, Pale Fascia Exposed: Necrotic Amount: Large (67-100%) Fat Layer (Subcu Necrotic Quality: Adherent Slough Tendon Exposed: Muscle Exposed: Joint Exposed: Bone Exposed: Cleansing: No Yes Exposed Structure No taneous Tissue) Exposed: Yes No No No No rea: 28.5% olume: 63.6% n: None No No Treatment Notes Wound #3 (Left, Lateral Calcaneus) 1. Cleanse With  Wound Cleanser 3. Primary Dressing Applied Collegen AG Hydrogel or K-Y Jelly 4. Secondary Dressing Dry Gauze Roll Gauze Heel Cup 5. Secured With Medipore tape 7. Footwear/Offloading device applied Surgical shoe Electronic Signature(s) Signed: 05/04/2019 6:10:03 PM By: Baruch Gouty RN, BSN Entered By: Baruch Gouty on 05/04/2019 12:00:10 -------------------------------------------------------------------------------- Vitals Details Patient Name: Date of Service: [name] 05/04/2019 11:00 AM Medical Record [id number] Patient Account Number: [id number] Date of Birth/Sex: Treating RN: [birth year] (86 y.o. [name] Primary Care Cormick Moss: Jerlyn Ly Other Clinician: Referring Audreyanna Butkiewicz: Treating Newell Frater/Extender:Robson, Pollyann Kennedy, Vivianne Master in Treatment: 3 Vital Signs Time Taken: 11:50 Temperature (F): 98.3 Height (in): 67 Pulse (bpm): 60 Source: Stated Respiratory Rate (breaths/min): 18 Weight (lbs): 124 Blood Pressure (mmHg): 163/69 Source: Stated Reference Range: 80 - 120 mg / dl Body Mass Index (BMI): 19.4 Electronic Signature(s) Signed: 05/04/2019 6:10:03 PM By: Baruch Gouty RN, BSN Entered By: Baruch Gouty on 05/04/2019 11:50:29

## 2019-05-08 ENCOUNTER — Other Ambulatory Visit (HOSPITAL_COMMUNITY): Payer: Self-pay | Admitting: Interventional Radiology

## 2019-05-08 ENCOUNTER — Ambulatory Visit (HOSPITAL_COMMUNITY)
Admission: RE | Admit: 2019-05-08 | Discharge: 2019-05-08 | Disposition: A | Discharge status: Home / Self Care | Payer: Medicare Other | Source: Ambulatory Visit | Attending: Interventional Radiology | Admitting: Interventional Radiology

## 2019-05-08 ENCOUNTER — Other Ambulatory Visit: Payer: Self-pay

## 2019-05-08 DIAGNOSIS — Z4803 Encounter for change or removal of drains: Secondary | ICD-10-CM | POA: Diagnosis not present

## 2019-05-08 DIAGNOSIS — K831 Obstruction of bile duct: Secondary | ICD-10-CM | POA: Diagnosis not present

## 2019-05-08 DIAGNOSIS — I129 Hypertensive chronic kidney disease with stage 1 through stage 4 chronic kidney disease, or unspecified chronic kidney disease: Secondary | ICD-10-CM | POA: Diagnosis not present

## 2019-05-08 DIAGNOSIS — I4891 Unspecified atrial fibrillation: Secondary | ICD-10-CM | POA: Diagnosis not present

## 2019-05-08 DIAGNOSIS — M25551 Pain in right hip: Secondary | ICD-10-CM | POA: Diagnosis not present

## 2019-05-08 DIAGNOSIS — W19XXXA Unspecified fall, initial encounter: Secondary | ICD-10-CM | POA: Diagnosis not present

## 2019-05-08 DIAGNOSIS — Z434 Encounter for attention to other artificial openings of digestive tract: Secondary | ICD-10-CM | POA: Diagnosis not present

## 2019-05-08 DIAGNOSIS — D6869 Other thrombophilia: Secondary | ICD-10-CM | POA: Diagnosis not present

## 2019-05-08 DIAGNOSIS — N1832 Chronic kidney disease, stage 3b: Secondary | ICD-10-CM | POA: Diagnosis not present

## 2019-05-08 HISTORY — PX: IR EXCHANGE BILIARY DRAIN: IMG6046

## 2019-05-08 MED ORDER — IOHEXOL 300 MG/ML  SOLN
50.0000 mL | Freq: Once | INTRAMUSCULAR | Status: AC | PRN
  Administered 2019-05-08: 16:00:00 5 mL

## 2019-05-08 NOTE — Procedures (Signed)
Interventional Radiology Procedure Note  Procedure: Routine exchange of left sided int/ext biliary drain.  New 50F drain.   Complications: None  Recommendations:  - To gravity.  Capped - Do not submerge - Routine drain care   Signed,  Dulcy Fanny. Earleen Newport, DO

## 2019-05-09 NOTE — Progress Notes (Signed)
[name] [name] ([id number]) Visit Report for 04/19/2019 Arrival Information Details Patient Name: Date of Service: [name] [name], [name] [name] 04/19/2019 12:30 PM Medical Record [id number] Patient Account Number: [id number] Date of Birth/Sex: Treating RN: December 30, 1917 (86 y.o. [name], Meta.Reding Primary Care Carly Sabo: Jerlyn Ly Other Clinician: Referring John Vasconcelos: Treating Kenli Waldo/Extender:Robson, Pollyann Kennedy, Vivianne Master in Treatment: 1 Visit Information History Since Last Visit Added or deleted any medications: No Patient Arrived: Wheel Chair Any new allergies or adverse reactions: No Arrival Time: 12:50 Had a fall or experienced change in No Accompanied By: daughter activities of daily living that may affect Transfer Assistance: None risk of falls: Patient Identification Verified: Yes Signs or symptoms of abuse/neglect since last No Secondary Verification Process Yes visito Completed: Hospitalized since last visit: No Patient Has Alerts: Yes Implantable device outside of the clinic excluding No Patient Alerts: Patient on Blood cellular tissue based products placed in the center Thinner since last visit: ABI L: 1.5 Has Dressing in Place as Prescribed: Yes TBI L: 0.36 Pain Present Now: No Electronic Signature(s) Signed: 05/09/2019 9:21:08 AM By: Sandre Kitty Entered By: Sandre Kitty on 04/19/2019 12:50:35 -------------------------------------------------------------------------------- Encounter Discharge Information Details Patient Name: Date of Service: [name] [name]. 04/19/2019 12:30 PM Medical Record [id number] Patient Account Number: [id number] Date of Birth/Sex: Treating RN: [birth year] (86 y.o. [name] [name] Primary Care Alexea Blase: Jerlyn Ly Other Clinician: Referring Sanjuana Mruk: Treating Cebastian Neis/Extender:Robson, Pollyann Kennedy, Vivianne Master in Treatment: 1 Encounter Discharge Information Items Post Procedure Vitals Discharge Condition:  Stable Temperature (F): 98.1 Ambulatory Status: Wheelchair Pulse (bpm): 68 Discharge Destination: Home Respiratory Rate (breaths/min): 18 Transportation: Private Auto Blood Pressure (mmHg): 150/56 Accompanied By: Charolett Bumpers Schedule Follow-up Appointment: Yes Clinical Summary of Care: Patient Declined Electronic Signature(s) Signed: 04/19/2019 5:37:17 PM By: Baruch Gouty RN, BSN Entered By: Baruch Gouty on 04/19/2019 13:56:24 -------------------------------------------------------------------------------- Lower Extremity Assessment Details Patient Name: Date of Service: [name] [name]. 04/19/2019 12:30 PM Medical Record [id number] Patient Account Number: [id number] Date of Birth/Sex: Treating RN: 1917/11/07 (86 y.o. Orvan Falconer Primary Care Tyrah Broers: Jerlyn Ly Other Clinician: Referring Jaynia Fendley: Treating Akili Cuda/Extender:Robson, Pollyann Kennedy, Vivianne Master in Treatment: 1 Edema Assessment Assessed: [Left: No] [Right: No] Edema: [Left: Ye] [Right: s] Calf Left: Right: Point of Measurement: cm From Medial Instep 31 cm cm Ankle Left: Right: Point of Measurement: cm From Medial Instep 20.8 cm cm Electronic Signature(s) Signed: 04/20/2019 5:43:34 PM By: Carlene Coria RN Entered By: Carlene Coria on 04/19/2019 13:01:43 -------------------------------------------------------------------------------- Multi Wound Chart Details Patient Name: Date of Service: [name] [name]. 04/19/2019 12:30 PM Medical Record [id number] Patient Account Number: [id number] Date of Birth/Sex: Treating RN: [birth year] (86 y.o. 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] [N/A:N/A] Comorbid History: [2:Cataracts, Chronic sinus Cataracts, Chronic sinus N/A problems/congestion, Anemia, Angina, Arrhythmia, Hypertension, Arrhythmia, Hypertension, Peripheral Arterial Disease, Peripheral Arterial Disease, Peripheral Venous Disease,  Peripheral Venous Disease, Osteoarthritis, Received Chemotherapy] [3:problems/congestion, Anemia, Angina, Osteoarthritis, Received Chemotherapy] Date Acquired: [2:02/22/2019] [3:04/04/2019] [N/A:N/A] Weeks of Treatment: [2:1] [3:1] [N/A:N/A] Wound Status: [2:Open] [3:Open] [N/A:N/A] Measurements L x W x D 0.8x0.6x0.1 [3:0.6x0.6x0.1] [N/A:N/A] (cm) Area (cm) : [2:0.377] [3:0.283] [N/A:N/A] Volume (cm) : [  2:0.038] [3:0.028] [N/A:N/A] % Reduction in Area: [2:76.50%] [3:-71.50%] [N/A:N/A] % Reduction in Volume: 76.30% [3:15.20%] [N/A:N/A] Classification: [2:Full Thickness Without Exposed Support Structures] [3:Category/Stage III] [N/A:N/A] Exudate Amount: [2:Small] [3:Small] [N/A:N/A] Exudate Type: [2:Serous] [3:Serosanguineous] [N/A:N/A] Exudate Color: [2:amber] [3:red, brown] [N/A:N/A] Wound Margin: [2:Distinct, outline attached Well defined, not attached N/A] Granulation Amount: [2:Medium (34-66%)] [3:Medium (34-66%)] [N/A:N/A] Granulation Quality: [2:Pink] [3:Pink, Pale] [N/A:N/A] Necrotic Amount: [2:Medium (34-66%)] [3:Medium (34-66%)] [N/A:N/A] Exposed Structures: [2:Fat Layer (Subcutaneous Fat Layer (Subcutaneous N/A Tissue) Exposed: Yes Fascia: No Tendon: No Muscle: No Joint: No Bone: No] [3:Tissue) Exposed: Yes Fascia: No Tendon: No Muscle: No Joint: No Bone: No] Epithelialization: [2:None] [3:None] [N/A:N/A] Debridement: [2:N/A] [3:Debridement - Excisional N/A] Pre-procedure [2:N/A]  [3:13:38] [N/A:N/A] Verification/Time Out Taken: Pain Control: [2:N/A] [3:Lidocaine 4% Topical Solution] [N/A:N/A] Tissue Debrided: [2:N/A] [3:Subcutaneous] [N/A:N/A] Level: [2:N/A] [3:Skin/Subcutaneous Tissue N/A] Debridement Area (sq cm):N/A [3:0.36] [N/A:N/A] Instrument: [2:N/A] [3:Curette] [N/A:N/A] Bleeding: [2:N/A] [3:Minimum] [N/A:N/A] Hemostasis Achieved: [2:N/A] [3:Pressure] [N/A:N/A] Procedural Pain: [2:N/A] [3:0] [N/A:N/A] Post Procedural Pain: [2:N/A] [3:0] [N/A:N/A] Debridement Treatment [2:N/A] [3:Procedure was tolerated] [N/A:N/A] Response: [3:well] Post Debridement [2:N/A] [3:0.6x0.6x0.1] [N/A:N/A] Measurements L x W x D (cm) Post Debridement [2:N/A] [3:0.028] [N/A:N/A] Volume: (cm) Post Debridement Stage: [2:N/A N/A] [3:Category/Stage III Debridement] [N/A:N/A N/A] Treatment Notes Wound #2 (Left, Lateral Malleolus) 3. Primary Dressing Applied Collegen AG Hydrogel or K-Y Jelly 4. Secondary Dressing Dry Gauze Roll Gauze Heel Cup 7. Footwear/Offloading device applied Surgical shoe Wound #3 (Left, Lateral Calcaneus) 3. Primary Dressing Applied Collegen AG Hydrogel or K-Y Jelly 4. Secondary Dressing Dry Gauze Roll Gauze Heel Cup 7. Footwear/Offloading device applied Surgical shoe Electronic Signature(s) Signed: 04/19/2019 6:03:33 PM By: Linton Ham MD Signed: 04/19/2019 6:22:19 PM By: Deon Pilling Entered By: Linton Ham on 04/19/2019 14:02:06 -------------------------------------------------------------------------------- Multi-Disciplinary Care Plan Details Patient Name: Date of Service: [name] [name]. 04/19/2019 12:30 PM Medical Record [id number] Patient Account Number: [id number] Date of Birth/Sex: Treating RN: 02/16/1917 (86 y.o. [name], Tammi Klippel Primary Care Threasa Kinch: Jerlyn Ly Other Clinician: Referring Vickye Astorino: Treating Leondro Coryell/Extender:Robson, Pollyann Kennedy, Vivianne Master in Treatment: 1 Active Inactive Abuse / Safety  / Falls / Self Care Management Nursing Diagnoses: Potential for falls Goals: Patient will remain injury free related to falls Date Initiated: 04/12/2019 Target Resolution Date: 05/04/2019 Goal Status: Active Patient/caregiver will verbalize understanding of skin care regimen Date Initiated: 04/12/2019 Target Resolution Date: 05/04/2019 Goal Status: Active Interventions: Provide education on fall prevention Notes: Nutrition Nursing Diagnoses: Potential for alteratiion in Nutrition/Potential for imbalanced nutrition Goals: Patient/caregiver agrees to and verbalizes understanding of need to obtain nutritional consultation Date Initiated: 04/12/2019 Target Resolution Date: 05/04/2019 Goal Status: Active Interventions: Provide education on nutrition Treatment Activities: Patient referred to Primary Care Physician for further nutritional evaluation : 04/12/2019 Notes: Wound/Skin Impairment Nursing Diagnoses: Knowledge deficit related to ulceration/compromised skin integrity Goals: Ulcer/skin breakdown will heal within 14 weeks Date Initiated: 04/12/2019 Target Resolution Date: 08/03/2019 Goal Status: Active Interventions: Assess patient/caregiver ability to obtain necessary supplies Assess patient/caregiver ability to perform ulcer/skin care regimen upon admission and as needed Provide education on ulcer and skin care Treatment Activities: Skin care regimen initiated : 04/12/2019 Topical wound management initiated : 04/12/2019 Notes: Electronic Signature(s) Signed: 04/19/2019 6:22:19 PM By: Deon Pilling Entered By: Deon Pilling on 04/19/2019 13:23:16 -------------------------------------------------------------------------------- Pain Assessment Details Patient Name: Date of Service: MALESHA, BENVENUTO 04/19/2019 12:30 PM Medical Record [id number] Patient Account Number: [id number] Date of Birth/Sex: Treating RN: 09-Jun-1917 (86 y.o. [name] Primary Care Linard Daft: Jerlyn Ly Other Clinician: Referring Tija Biss: Treating  Gordana Kewley/Extender:Robson, Pollyann Kennedy, Mark A Weeks in Treatment: 1 Active Problems Location of Pain Severity and Description of Pain Patient Has Paino No Site Locations Pain Management and Medication Current Pain Management: Electronic Signature(s) Signed: 04/19/2019 6:22:19 PM By: Deon Pilling Signed: 05/09/2019 9:21:08 AM By: Sandre Kitty Entered By: Sandre Kitty on 04/19/2019 12:51:06 -------------------------------------------------------------------------------- Patient/Caregiver Education Details Patient Name: Date of Service: [name] [name] 3/25/2021andnbsp12:30 PM Medical Record [id number] Patient Account Number: [id number] Date of Birth/Gender: Treating RN: 1917/10/23 (86 y.o. [name] Primary Care Physician: Jerlyn Ly Other Clinician: Referring Physician: Treating Physician/Extender:Robson, Pollyann Kennedy, Vivianne Master in Treatment: 1 Education Assessment Education Provided To: Patient Education Topics Provided Wound/Skin Impairment: Handouts: Skin Care Do's and Dont's Methods: Explain/Verbal Responses: Reinforcements needed Electronic Signature(s) Signed: 04/19/2019 6:22:19 PM By: Deon Pilling Entered By: Deon Pilling on 04/19/2019 13:23:30 -------------------------------------------------------------------------------- Wound Assessment Details Patient Name: Date of Service: LUNETTE, SAPON 04/19/2019 12:30 PM Medical Record [id number] Patient Account Number: [id number] Date of Birth/Sex: Treating RN: [birth year] (86 y.o. Orvan Falconer Primary Care Emy Angevine: Jerlyn Ly Other Clinician: Referring Suhaan Perleberg: Treating Marely Apgar/Extender:Robson, Pollyann Kennedy, Vivianne Master in Treatment: 1 Wound Status Wound Number: 2 Primary Arterial Insufficiency Ulcer Etiology: Wound Location: Left, Lateral Malleolus Wound Open Wounding Event: Gradually Appeared Status: Date  Acquired: 02/22/2019 Comorbid Cataracts, Chronic sinus problems/congestion, Weeks Of Treatment: 1 History: Anemia, Angina, Arrhythmia, Hypertension, Clustered Wound: No Peripheral Arterial Disease, Peripheral Venous Disease, Osteoarthritis, Received Chemotherapy Photos Photo Uploaded By: Mikeal Hawthorne on 04/20/2019 13:36:52 Wound Measurements Length: (cm) 0.8 % Reduct Width: (cm) 0.6 % Reduc Depth: (cm) 0.1 Epithel Area: (cm) 0.377 Tunnel Volume: (cm) 0.038 Underm Wound Description Classification: Full Thickness Without Exposed Support Structures Wound Distinct, outline attached Margin: Exudate Small Amount: Exudate Serous Type: Exudate amber Color: Wound Bed Granulation Amount: Medium (34-66%) Granulation Quality: Pink Necrotic Amount: Medium (34-66%) Necrotic Quality: Adherent Slough Foul Odor After Cleansing: No Slough/Fibrino Yes Exposed Structure Fascia Exposed: No Fat Layer (Subcutaneous Tissue) Exposed: Yes Tendon Exposed: No Muscle Exposed: No Joint Exposed: No Bone Exposed: No ion in Area: 76.5% tion in Volume: 76.3% ialization: None ing: No ining: No Electronic Signature(s) Signed: 04/20/2019 5:43:34 PM By: Carlene Coria RN Entered By: Carlene Coria on 04/19/2019 13:02:17 -------------------------------------------------------------------------------- Wound Assessment Details Patient Name: Date of Service: MARYLEA, ROBLYER 04/19/2019 12:30 PM Medical Record [id number] Patient Account Number: [id number] Date of Birth/Sex: Treating RN: [birth year] (86 y.o. Orvan Falconer Primary Care Kasumi Ditullio: Jerlyn Ly Other Clinician: Referring Isak Sotomayor: Treating Kailen Hinkle/Extender:Robson, Pollyann Kennedy, Vivianne Master in Treatment: 1 Wound Status Wound Number: 3 Primary Pressure Ulcer Etiology: Wound Location: Left, Lateral Calcaneus Wound Open Wounding Event: Pressure Injury Status: Date Acquired: 04/04/2019 Comorbid Cataracts, Chronic sinus  problems/congestion, Weeks Of Treatment: 1 History: Anemia, Angina, Arrhythmia, Hypertension, Clustered Wound: No Peripheral Arterial Disease, Peripheral Venous Disease, Osteoarthritis, Received Chemotherapy Photos Photo Uploaded By: Mikeal Hawthorne on 04/20/2019 13:37:07 Wound Measurements Length: (cm) 0.6 Width: (cm) 0.6 Depth: (cm) 0.1 Area: (cm) 0.283 Volume: (cm) 0.028 Wound Description Classification: Category/Stage III Wound Margin: Well defined, not attached Exudate Amount: Small Exudate Type: Serosanguineous Exudate Color: red, brown Wound Bed Granulation Amount: Medium (34-66%) Granulation Quality: Pink, Pale Necrotic Amount: Medium (34-66%) Necrotic Quality: Adherent Slough After Cleansing: No rino Yes Exposed Structure sed: No Subcutaneous Tissue) Exposed: Yes sed: No sed: No ed: No d: No % Reduction in Area: -71.5% % Reduction in Volume: 15.2% Epithelialization: None Tunneling: No Undermining: No Foul Odor Slough/Fib Fascia Expo Fat Layer ( Tendon Expo  Muscle Expo Joint Expos Bone Expose Electronic Signature(s) Signed: 04/20/2019 5:43:34 PM By: Carlene Coria RN Entered By: Carlene Coria on 04/19/2019 13:02:17 -------------------------------------------------------------------------------- [place] Details Patient Name: Date of Service: [name] [name]. 04/19/2019 12:30 PM Medical Record [id number] Patient Account Number: [id number] Date of Birth/Sex: Treating RN: 1917/12/06 (86 y.o. [name], Tammi Klippel Primary Care Trenyce Loera: Jerlyn Ly Other Clinician: Referring Pollyanna Levay: Treating Davon Folta/Extender:Robson, Pollyann Kennedy, Vivianne Master in Treatment: 1 Vital Signs Time Taken: 12:50 Temperature (F): 98.1 Height (in): 67 Pulse (bpm): 68 Weight (lbs): 124 Respiratory Rate (breaths/min): 19 Body Mass Index (BMI): 19.4 Blood Pressure (mmHg): 150/56 Reference Range: 80 - 120 mg / dl Electronic Signature(s) Signed: 05/09/2019 9:21:08 AM  By: Sandre Kitty Entered By: Sandre Kitty on 04/19/2019 12:50:51

## 2019-05-22 ENCOUNTER — Other Ambulatory Visit (HOSPITAL_COMMUNITY): Payer: Self-pay | Admitting: *Deleted

## 2019-05-23 ENCOUNTER — Ambulatory Visit (HOSPITAL_COMMUNITY)
Admission: RE | Admit: 2019-05-23 | Discharge: 2019-05-23 | Disposition: A | Discharge status: Home / Self Care | Payer: Medicare Other | Source: Ambulatory Visit | Attending: Internal Medicine | Admitting: Internal Medicine

## 2019-05-23 ENCOUNTER — Other Ambulatory Visit: Payer: Self-pay

## 2019-05-23 DIAGNOSIS — M810 Age-related osteoporosis without current pathological fracture: Secondary | ICD-10-CM | POA: Insufficient documentation

## 2019-05-23 MED ORDER — DENOSUMAB 60 MG/ML ~~LOC~~ SOSY: 60.0000 mg | PREFILLED_SYRINGE | Freq: Once | SUBCUTANEOUS | Status: DC

## 2019-05-23 MED ORDER — DENOSUMAB 60 MG/ML ~~LOC~~ SOSY
PREFILLED_SYRINGE | SUBCUTANEOUS | Status: AC
  Administered 2019-05-23: 60 mg
  Filled 2019-05-23: qty 1

## 2019-05-25 ENCOUNTER — Encounter (HOSPITAL_BASED_OUTPATIENT_CLINIC_OR_DEPARTMENT_OTHER): Payer: Medicare Other | Admitting: Internal Medicine

## 2019-05-25 ENCOUNTER — Other Ambulatory Visit: Payer: Self-pay

## 2019-05-25 DIAGNOSIS — L97322 Non-pressure chronic ulcer of left ankle with fat layer exposed: Secondary | ICD-10-CM | POA: Diagnosis not present

## 2019-05-25 DIAGNOSIS — L89623 Pressure ulcer of left heel, stage 3: Secondary | ICD-10-CM | POA: Diagnosis not present

## 2019-05-25 DIAGNOSIS — D649 Anemia, unspecified: Secondary | ICD-10-CM | POA: Diagnosis not present

## 2019-05-25 DIAGNOSIS — I129 Hypertensive chronic kidney disease with stage 1 through stage 4 chronic kidney disease, or unspecified chronic kidney disease: Secondary | ICD-10-CM | POA: Diagnosis not present

## 2019-05-25 DIAGNOSIS — N182 Chronic kidney disease, stage 2 (mild): Secondary | ICD-10-CM | POA: Diagnosis not present

## 2019-05-25 DIAGNOSIS — I70243 Atherosclerosis of native arteries of left leg with ulceration of ankle: Secondary | ICD-10-CM | POA: Diagnosis not present

## 2019-05-25 DIAGNOSIS — Z681 Body mass index (BMI) 19 or less, adult: Secondary | ICD-10-CM | POA: Diagnosis not present

## 2019-05-25 NOTE — Progress Notes (Signed)
[name] [name], [place] ([id number]) Visit Report for 05/25/2019 HPI Details Patient Name: Date of Service: [place], [name]. 05/25/2019 11:00 A M Medical Record Number: [id number] Patient Account Number: [id number] Date of Birth/Sex: Treating RN: 1917/05/15 (86 y.o. [name] [name] Primary Care Provider: Jerlyn Ly Other Clinician: Referring Provider: Treating Provider/Extender: Leigh Aurora in Treatment: 6 History of Present Illness Location: Patient presents with an ulcer on the left lateral ankle. Quality: Patient reports No Pain. Severity: Patient states wound(s) are getting worse. Duration: Patient has had the wound for > 6 months prior to seeking treatment at the wound center Timing: Pain in wound is Intermittent (comes and goes Context: The wound would happen gradually Modifying Factors: the ulcer has been persistent after treatment for at least 3 months and at the present time collagen and has been ssociated Signs and Symptoms: Patient reports presence of swelling A HPI Description: Pleasant 86 year old patient who has no significant medical problems except mild high blood pressure, cardiac irregularity and generalized aches and pains. She presented to the podiatrist Dr. Melony Overly for a left lateral ankle ulceration and has been treated for about 3 months without resolution. The patient was referred to [place] for further care. The patient has not had any vascular study done except for a DVT study done about 2 years ago which was negative for DVT of the lower extremities. 09/11/2014 -- the patient had her vascular study done today but the reports are not ready yet. 09/18/2014 -- the ABI was not measured due to having noncompressible vessels due to medial calcification. However the bilateral tibial artery Doppler waveform in the TBI was normal and no further tests were recommended. 10/30/2014 -- she has developed a superficial abrasion just medial and superior to her  previous wound. 11/13/2014 -- she has been authorized to get the tri-layer Oasis. She has done very well with her [place] last week. 12/18/2014 -- her wound has done very well but stalled a bit due to possible superimposed tape burn. After cleaning the wound I believe it's time to use her tri- layer Oasis. 12/25/2014 -- She has had her Second application of Oasis. 01/01/2015 -- her wound looks excellent today and we will not use the next application of Oasis. 01/15/2015 -- she is here for review after 2 weeks due to scheduling issues. she has been doing the dressing as advised READMISSION 04/12/2019 Patient is now 86 year old woman. She was here in the wound care center in 2016 under the care of Dr. Con Memos with a left lateral ankle ulcer. Eventually this healed over however an area in the same location opened up at the end of January. She is known to have PAD and follow-up arterial studies were ordered on 3/12. This showed noncompressible ABIs bilaterally at 1.51. Great toe pressures were only 61 on the right and 65 on the left resulting in TBI's of 0.36 and 0.39 respectively. The patient apparently declined any invasive procedures including angiography. About a week ago she had another wound open up on the lateral part of her heel just above the weightbearing plantar part of the heel. It is felt that this was probably a pressure ulcer. The patient is not a diabetic or smoker. She is not complaining of a lot of pain. She does complain of the heel more than her lateral malleolus. Past medical history includes sick sinus syndrome on Eliquis, hypothyroidism, macular degeneration, hypertension, hyperlipidemia, stage II chronic renal failure apparently some history of malignancy involving her  stomach 3/25; patient with known PAD who does not want any further consideration of interventions. She has areas on her left lateral malleolus and left lateral heel. Using silver collagen. She did  not tolerate bunny boots to offload the heel so they have been using a long pillow. Her daughter is changing the dressings 4/9; patient with known PAD who does not want to pursue any further investigations. She is complaining of minimal pain. We have been using silver collagen to to wound areas 1 on the left lateral malleolus and the other on the left lateral heel just above the plantar surface. Her daughter is religiously offloading these areas especially can 4/30; patient with known PAD does not wish to pursue any further investigations. And she complains of minimal amounts of pain. She has an area on the left lateral malleolus and the left lateral heel. We have been using silver collagen. Change this to polymen Ag this week Electronic Signature(s) Signed: 05/25/2019 6:02:27 PM By: Linton Ham MD Entered By: Linton Ham on 05/25/2019 12:54:53 -------------------------------------------------------------------------------- Physical Exam Details Patient Name: Date of Service: [name] [name]. 05/25/2019 11:00 A M Medical Record Number: [id number] Patient Account Number: [id number] Date of Birth/Sex: Treating RN: 23-Jul-1917 (86 y.o. [name] [name] Primary Care Provider: Jerlyn Ly Other Clinician: Referring Provider: Treating Provider/Extender: Leigh Aurora in Treatment: 6 Constitutional Sitting or standing Blood Pressure is within target range for patient.. Pulse regular and within target range for patient.Marland Kitchen Respirations regular, non-labored and within target range.. Temperature is normal and within the target range for the patient.Marland Kitchen Appears in no distress. Cardiovascular Popliteal pulses palpable. Pedal pulses absent on the left. Notes Wound exam; the area has a small wound on the left lateral malleolus. This looks clean some debris. Debrided with Anasept and gauze. The area on the heel is smaller. Only a small open area remains Electronic  Signature(s) Signed: 05/25/2019 6:02:27 PM By: Linton Ham MD Entered By: Linton Ham on 05/25/2019 12:58:46 -------------------------------------------------------------------------------- Physician Orders Details Patient Name: Date of Service: [name] [name]. 05/25/2019 11:00 A M Medical Record Number: [id number] Patient Account Number: [id number] Date of Birth/Sex: Treating RN: 08/07/1917 (86 y.o. [name] [name] Primary Care Provider: Jerlyn Ly Other Clinician: Referring Provider: Treating Provider/Extender: Leigh Aurora in Treatment: 6 Verbal / Phone Orders: No Diagnosis Coding ICD-10 Coding Code Description [phone] Atherosclerosis of native arteries of left leg with ulceration of ankle L89.623 Pressure ulcer of left heel, stage 3 Follow-up Appointments ppointment in 2 weeks. - Friday Return A Dressing Change Frequency Change Dressing every other day. Skin Barriers/Peri-Wound Care Wound #2 Left,Lateral Malleolus Skin Prep Wound Cleansing May shower with protection. - protect the dressing when showering on days not changing the dressing. May shower and wash wound with soap and water. - with dressing changes only. Primary Wound Dressing Wound #2 Left,Lateral Malleolus Polymem Silver Wound #3 Left,Lateral Calcaneus Polymem Silver Secondary Dressing Wound #3 Left,Lateral Calcaneus Kerlix/Rolled Gauze Dry Gauze Foam Border - or bordered gauze Heel Cup Edema Control Avoid standing for long periods of time Elevate legs to the level of the heart or above for 30 minutes daily and/or when sitting, a frequency of: - throughout the day. Off-Loading Open toe surgical shoe to: - left foot. Other: - Patient to wear bunny boot to aid in relieving pressure from left heel and ankle. Additional Orders / Instructions Follow Nutritious Diet - increase protein and vegetables to aid in wound healing. Electronic Signature(s)  Signed: 05/25/2019  5:51:03 PM By: Kela Millin Signed: 05/25/2019 6:02:27 PM By: Linton Ham MD Entered By: Kela Millin on 05/25/2019 12:32:01 -------------------------------------------------------------------------------- Problem List Details Patient Name: Date of Service: [name] [name]. 05/25/2019 11:00 A M Medical Record Number: [id number] Patient Account Number: [id number] Date of Birth/Sex: Treating RN: Apr 14, 1917 (86 y.o. [name] Primary Care Provider: Jerlyn Ly Other Clinician: Referring Provider: Treating Provider/Extender: Leigh Aurora in Treatment: 6 Active Problems ICD-10 Encounter Code Description Active Date MDM Diagnosis I70.243 Atherosclerosis of native arteries of left leg with ulceration of ankle 04/12/2019 No Yes L89.623 Pressure ulcer of left heel, stage 3 04/12/2019 No Yes Inactive Problems Resolved Problems Electronic Signature(s) Signed: 05/25/2019 6:02:27 PM By: Linton Ham MD Entered By: Linton Ham on 05/25/2019 12:53:34 -------------------------------------------------------------------------------- Progress Note Details Patient Name: Date of Service: [name] [name]. 05/25/2019 11:00 A M Medical Record Number: [id number] Patient Account Number: [id number] Date of Birth/Sex: Treating RN: 1917/01/30 (86 y.o. [name] [name] Primary Care Provider: Jerlyn Ly Other Clinician: Referring Provider: Treating Provider/Extender: Leigh Aurora in Treatment: 6 Subjective History of Present Illness (HPI) The following HPI elements were documented for the patient's wound: Location: Patient presents with an ulcer on the left lateral ankle. Quality: Patient reports No Pain. Severity: Patient states wound(s) are getting worse. Duration: Patient has had the wound for > 6 months prior to seeking treatment at the wound center Timing: Pain in wound is Intermittent (comes and goes Context:  The wound would happen gradually Modifying Factors: the ulcer has been persistent after treatment for at least 3 months and at the present time collagen and has been Associated Signs and Symptoms: Patient reports presence of swelling Pleasant 86 year old patient who has no significant medical problems except mild high blood pressure, cardiac irregularity and generalized aches and pains. She presented to the podiatrist Dr. Melony Overly for a left lateral ankle ulceration and has been treated for about 3 months without resolution. The patient was referred to [place] for further care. The patient has not had any vascular study done except for a DVT study done about 2 years ago which was negative for DVT of the lower extremities. 09/11/2014 -- the patient had her vascular study done today but the reports are not ready yet. 09/18/2014 -- the ABI was not measured due to having noncompressible vessels due to medial calcification. However the bilateral tibial artery Doppler waveform in the TBI was normal and no further tests were recommended. 10/30/2014 -- she has developed a superficial abrasion just medial and superior to her previous wound. 11/13/2014 -- she has been authorized to get the tri-layer Oasis. She has done very well with her [place] last week. 12/18/2014 -- her wound has done very well but stalled a bit due to possible superimposed tape burn. After cleaning the wound I believe it's time to use her tri- layer Oasis. 12/25/2014 -- She has had her Second application of Oasis. 01/01/2015 -- her wound looks excellent today and we will not use the next application of Oasis. 01/15/2015 -- she is here for review after 2 weeks due to scheduling issues. she has been doing the dressing as advised READMISSION 04/12/2019 Patient is now 86 year old woman. She was here in the wound care center in 2016 under the care of Dr. Con Memos with a left lateral ankle ulcer. Eventually this healed over however an  area in the same location opened up at the end of January. She  is known to have PAD and follow-up arterial studies were ordered on 3/12. This showed noncompressible ABIs bilaterally at 1.51. Great toe pressures were only 61 on the right and 65 on the left resulting in TBI's of 0.36 and 0.39 respectively. The patient apparently declined any invasive procedures including angiography. About a week ago she had another wound open up on the lateral part of her heel just above the weightbearing plantar part of the heel. It is felt that this was probably a pressure ulcer. The patient is not a diabetic or smoker. She is not complaining of a lot of pain. She does complain of the heel more than her lateral malleolus. Past medical history includes sick sinus syndrome on Eliquis, hypothyroidism, macular degeneration, hypertension, hyperlipidemia, stage II chronic renal failure apparently some history of malignancy involving her stomach 3/25; patient with known PAD who does not want any further consideration of interventions. She has areas on her left lateral malleolus and left lateral heel. Using silver collagen. She did not tolerate bunny boots to offload the heel so they have been using a long pillow. Her daughter is changing the dressings 4/9; patient with known PAD who does not want to pursue any further investigations. She is complaining of minimal pain. We have been using silver collagen to to wound areas 1 on the left lateral malleolus and the other on the left lateral heel just above the plantar surface. Her daughter is religiously offloading these areas especially can 4/30; patient with known PAD does not wish to pursue any further investigations. And she complains of minimal amounts of pain. She has an area on the left lateral malleolus and the left lateral heel. We have been using silver collagen. Change this to polymen Ag this week Objective Constitutional Sitting or standing Blood Pressure is within  target range for patient.. Pulse regular and within target range for patient.Marland Kitchen Respirations regular, non-labored and within target range.. Temperature is normal and within the target range for the patient.Marland Kitchen Appears in no distress. Vitals Time Taken: 11:36 AM, Height: 67 in, Weight: 124 lbs, BMI: 19.4, Temperature: 98.3 F, Pulse: 63 bpm, Respiratory Rate: 18 breaths/min, Blood Pressure: 121/32 mmHg. Cardiovascular Popliteal pulses palpable. Pedal pulses absent on the left. General Notes: Wound exam; the area has a small wound on the left lateral malleolus. This looks clean some debris. Debrided with Anasept and gauze. The area on the heel is smaller. Only a small open area remains Integumentary (Hair, Skin) Wound #2 status is Open. Original cause of wound was Gradually Appeared. The wound is located on the Left,Lateral Malleolus. The wound measures 1.1cm length x 1.5cm width x 0.1cm depth; 1.296cm^2 area and 0.13cm^3 volume. There is Fat Layer (Subcutaneous Tissue) Exposed exposed. There is no tunneling or undermining noted. There is a small amount of serosanguineous drainage noted. The wound margin is distinct with the outline attached to the wound base. There is large (67-100%) pink granulation within the wound bed. There is a small (1-33%) amount of necrotic tissue within the wound bed including Adherent Slough. Wound #3 status is Open. Original cause of wound was Pressure Injury. The wound is located on the Left,Lateral Calcaneus. The wound measures 0.3cm length x 0.2cm width x 0.2cm depth; 0.047cm^2 area and 0.009cm^3 volume. There is Fat Layer (Subcutaneous Tissue) Exposed exposed. There is no tunneling or undermining noted. There is a small amount of serous drainage noted. The wound margin is flat and intact. There is large (67-100%) pink granulation within the wound bed. There  is a small (1-33%) amount of necrotic tissue within the wound bed including Adherent Slough. Assessment Active  Problems ICD-10 Atherosclerosis of native arteries of left leg with ulceration of ankle Pressure ulcer of left heel, stage 3 Plan Follow-up Appointments: Return Appointment in 2 weeks. - Friday Dressing Change Frequency: Change Dressing every other day. Skin Barriers/Peri-Wound Care: Wound #2 Left,Lateral Malleolus: Skin Prep Wound Cleansing: May shower with protection. - protect the dressing when showering on days not changing the dressing. May shower and wash wound with soap and water. - with dressing changes only. Primary Wound Dressing: Wound #2 Left,Lateral Malleolus: Polymem Silver Wound #3 Left,Lateral Calcaneus: Polymem Silver Secondary Dressing: Wound #3 Left,Lateral Calcaneus: Kerlix/Rolled Gauze Dry Gauze Foam Border - or bordered gauze Heel Cup Edema Control: Avoid standing for long periods of time Elevate legs to the level of the heart or above for 30 minutes daily and/or when sitting, a frequency of: - throughout the day. Off-Loading: Open toe surgical shoe to: - left foot. Other: - Patient to wear bunny boot to aid in relieving pressure from left heel and ankle. Additional Orders / Instructions: Follow Nutritious Diet - increase protein and vegetables to aid in wound healing. 1. I switch the primary dressing to polymen Ag change every second day. 2. Although blood supply is an issue here the area of the left lateral malleolus looked as though it had reasonable tissue Electronic Signature(s) Signed: 05/25/2019 6:02:27 PM By: Linton Ham MD Entered By: Linton Ham on 05/25/2019 12:59:29 -------------------------------------------------------------------------------- SuperBill Details Patient Name: Date of Service: [name] [name]. 05/25/2019 Medical Record Number: [id number] Patient Account Number: [id number] Date of Birth/Sex: Treating RN: [birth year] (86 y.o. [name] [name] Primary Care Provider: Jerlyn Ly Other Clinician: Referring  Provider: Treating Provider/Extender: Leigh Aurora in Treatment: 6 Diagnosis Coding ICD-10 Codes Code Description [phone] Atherosclerosis of native arteries of left leg with ulceration of ankle L89.623 Pressure ulcer of left heel, stage 3 Facility Procedures The patient participates with Medicare or their insurance follows the Medicare Facility Guidelines: CPT4 Code Description Modifier Quantity [id number] [place] VISIT-LEV 3 EST PT 1 Physician Procedures : CPT4 Code Description Modifier [id number] - WC PHYS LEVEL 3 - EST PT ICD-10 Diagnosis Description I70.243 Atherosclerosis of native arteries of left leg with ulceration of ankle L89.623 Pressure ulcer of left heel, stage 3 Quantity: 1 Electronic Signature(s) Signed: 05/25/2019 6:02:27 PM By: Linton Ham MD Entered By: Linton Ham on 05/25/2019 12:59:47

## 2019-06-06 NOTE — Progress Notes (Signed)
[name] [name], [name] [name] ([id number]) Visit Report for 05/25/2019 Arrival Information Details Patient Name: Date of Service: [name] [name], [name]. 05/25/2019 11:00 A M Medical Record Number: [id number] Patient Account Number: [id number] Date of Birth/Sex: Treating RN: 09-18-1917 (86 y.o. [name] [name], [name] [name] [name] [name] Other Clinician: Referring [name] [name]: Treating [name] [name]/Extender: [name] [name] in [name]: 6 Visit Information History Since Last Visit Added or deleted any medications: No Patient Arrived: Wheel Chair Any new allergies or adverse reactions: No Arrival Time: 11:35 Had a fall or experienced change in No Accompanied By: daughter activities of daily living that may affect Transfer Assistance: None risk of falls: Patient Identification Verified: Yes Signs or symptoms of abuse/neglect since last visito No Secondary Verification Process Completed: Yes Hospitalized since last visit: No Patient Has Alerts: Yes Implantable device outside of the clinic excluding No Patient Alerts: Patient on Blood Thinner cellular tissue based products placed in the center ABI L: 1.5 since last visit: TBI L: 0.36 Has Dressing in Place as Prescribed: Yes Pain Present Now: No Electronic Signature(s) Signed: 06/06/2019 9:09:16 AM By: [name] [name] Entered By: [name] [name] on 05/25/2019 11:46:23 -------------------------------------------------------------------------------- Clinic Level of Care Assessment Details Patient Name: Date of Service: [name] [name], [name] [name] 05/25/2019 11:00 A M Medical Record Number: [id number] Patient Account Number: [id number] Date of Birth/Sex: Treating RN: 1917/12/29 (86 y.o. [name] [name] [name] Care [name] [name]: [name] [name] Other Clinician: Referring [name] [name]: Treating [name] [name]: 6 Clinic Level of Care Assessment Items TOOL 4 Quantity Score X- 1  0 Use when only an EandM is performed on FOLLOW-UP visit ASSESSMENTS - Nursing Assessment / Reassessment X- 1 10 Reassessment of Co-morbidities (includes updates in patient status) X- 1 5 Reassessment of Adherence to [name] Plan ASSESSMENTS - Wound and Skin A ssessment / Reassessment _0  - 0 Simple Wound Assessment / Reassessment - one wound _1  - 0 Complex Wound Assessment / Reassessment - multiple wounds _2  - 0 Dermatologic / Skin Assessment (not related to wound area) ASSESSMENTS - Focused Assessment X- 1 5 Circumferential Edema Measurements - multi extremities _3  - 0 Nutritional Assessment / Counseling / Intervention _4  - 0 Lower Extremity Assessment (monofilament, tuning fork, pulses) _5  - 0 Peripheral Arterial Disease Assessment (using hand held doppler) ASSESSMENTS - Ostomy and/or Continence Assessment and Care _6  - 0 Incontinence Assessment and Management _7  - 0 Ostomy Care Assessment and Management (repouching, etc.) PROCESS - Coordination of Care X - Simple Patient / Family Education for ongoing care 1 15 _8  - 0 Complex (extensive) Patient / Family Education for ongoing care X- 1 10 Staff obtains Programmer, systems, Records, T Results / Process Orders est _9  - 0 Staff telephones HHA, Nursing Homes / Clarify orders / etc _10  - 0 Routine Transfer to another Facility (non-emergent condition) _11  - 0 Routine Hospital Admission (non-emergent condition) _12  - 0 New Admissions / Biomedical engineer / Ordering NPWT Apligraf, etc. , _13  - 0 Emergency Hospital Admission (emergent condition) X- 1 10 Simple Discharge Coordination _14  - 0 Complex (extensive) Discharge Coordination PROCESS - Special Needs _15  - 0 Pediatric / Minor Patient Management _16  - 0 Isolation Patient Management _17  - 0 Hearing / Language / Visual special needs _18  - 0 Assessment of Community assistance (transportation, D/C planning, etc.) _19  - 0 Additional assistance / Altered mentation _20  -  0 Support Surface(s) Assessment (bed, cushion, seat, etc.) INTERVENTIONS - Wound Cleansing / Measurement _21  - 0 Simple Wound Cleansing - one  wound X- 2 5 Complex Wound Cleansing - multiple wounds X- 1 5 Wound Imaging (photographs - any number of wounds) _0  - 0 Wound Tracing (instead of photographs) _1  - 0 Simple Wound Measurement - one wound X- 2 5 Complex Wound Measurement - multiple wounds INTERVENTIONS - Wound Dressings X - Small Wound Dressing one or multiple wounds 1 10 _2  - 0 Medium Wound Dressing one or multiple wounds _3  - 0 Large Wound Dressing one or multiple wounds X- 1 5 Application of Medications - topical <ENIDPOEUMPNTIRWE>_3<\/[id number]>_6  - 0 Application of Medications - injection INTERVENTIONS - Miscellaneous _5  - 0 External ear exam _6  - 0 Specimen Collection (cultures, biopsies, blood, body fluids, etc.) _7  - 0 Specimen(s) / Culture(s) sent or taken to Lab for analysis _8  - 0 Patient Transfer (multiple staff / Civil Service fast streamer / Similar devices) _9  - 0 Simple Staple / Suture removal (25 or less) _10  - 0 Complex Staple / Suture removal (26 or more) _11  - 0 Hypo / Hyperglycemic Management (close monitor of Blood Glucose) _12  - 0 Ankle / Brachial Index (ABI) - do not check if billed separately X- 1 5 Vital Signs Has the patient been seen at the hospital within the last three years: Yes Total Score: 100 Level Of Care: New/Established - Level 3 Electronic Signature(s) Signed: 05/25/2019 5:51:03 PM By: Kela Millin Entered By: Kela Millin on 05/25/2019 12:32:46 -------------------------------------------------------------------------------- Lower Extremity Assessment Details Patient Name: Date of Service: [name] Shanks. 05/25/2019 11:00 A M Medical Record Number: [id number] Patient Account Number: [id number] Date of Birth/Sex: Treating RN: 11/10/1917 (86 y.o. Nancy Fetter [name] Care Merleen Picazo: [name] [name] Other Clinician: Referring Per Beagley: Treating Lacy Sofia/Extender:  [name] [name] in [name]: 6 Edema Assessment Assessed: Shirlyn Goltz: No] [Right: No] Edema: [Left: Ye] [Right: s] Calf Left: Right: Point of Measurement: cm From Medial Instep 29.7 cm cm Ankle Left: Right: Point of Measurement: cm From Medial Instep 20.3 cm cm Vascular Assessment Pulses: Dorsalis Pedis Palpable: [Left:Yes] Electronic Signature(s) Signed: 05/25/2019 5:50:39 PM By: Levan Hurst RN, BSN Entered By: Levan Hurst on 05/25/2019 11:50:20 -------------------------------------------------------------------------------- Multi Wound Chart Details Patient Name: Date of Service: [name] Shanks. 05/25/2019 11:00 A M Medical Record Number: [id number] Patient Account Number: [id number] Date of Birth/Sex: Treating RN: December 12, 1917 (86 y.o. [name] [name], [name] [name] [name] Care Letroy Vazguez: [name] [name] Other Clinician: Referring Toben Acuna: Treating Alexandar Weisenberger/Extender: [name] [name] in [name]: 6 Vital Signs Height(in): 25 Pulse(bpm): 51 Weight(lbs): 124 Blood Pressure(mmHg): 121/32 Body Mass Index(BMI): 19 Temperature(F): 98.3 Respiratory Rate(breaths/min): 18 Photos: [2:No Photos Left, Lateral Malleolus] [3:No Photos Left, Lateral Calcaneus] [N/A:N/A N/A] Wound Location: [2:Gradually Appeared] [3:Pressure Injury] [N/A:N/A] Wounding Event: [2:Arterial Insufficiency Ulcer] [3:Pressure Ulcer] [N/A:N/A] [name] Etiology: [2:Cataracts, Chronic sinus] [3:Cataracts, Chronic sinus] [N/A:N/A] Comorbid History: [2:problems/congestion, Anemia, Angina, Arrhythmia, Hypertension, Peripheral Arterial Disease, Peripheral Venous Disease, Osteoarthritis, Received Chemotherapy 02/22/2019] [3:problems/congestion, Anemia, Angina, Arrhythmia, Hypertension,  Peripheral Arterial Disease, Peripheral Venous Disease, Osteoarthritis, Received Chemotherapy 04/04/2019] [N/A:N/A] Date Acquired: [2:6] [3:6] [N/A:N/A] Weeks of [name]: [2:Open] [3:Open]  [N/A:N/A] Wound Status: [2:1.1x1.5x0.1] [3:0.3x0.2x0.2] [N/A:N/A] Measurements L x W x D (cm) [2:1.296] [3:0.047] [N/A:N/A] A (cm) : rea [2:0.13] [3:0.009] [N/A:N/A] Volume (cm) : [2:19.10%] [3:71.50%] [N/A:N/A] % Reduction in Area: [2:18.80%] [3:72.70%] [N/A:N/A] % Reduction in Volume: [2:Full Thickness Without Exposed] [3:Category/Stage III] [N/A:N/A] Classification: [2:Support Structures Small] [3:Small] [N/A:N/A] Exudate Amount: [2:Serosanguineous] [3:Serous] [N/A:N/A] Exudate Type: [2:red, brown] [3:amber] [N/A:N/A] Exudate Color: [2:Distinct, outline attached] [3:Flat and Intact] [N/A:N/A] Wound Margin: [2:Large (67-100%)] [3:Large (67-100%)] [N/A:N/A]  Granulation Amount: [2:Pink] [3:Pink] [N/A:N/A] Granulation Quality: [2:Small (1-33%)] [3:Small (1-33%)] [N/A:N/A] Necrotic Amount: [2:Fat Layer (Subcutaneous Tissue)] [3:Fat Layer (Subcutaneous Tissue)] [N/A:N/A] Exposed Structures: [2:Exposed: Yes Fascia: No Tendon: No Muscle: No Joint: No Bone: No Small (1-33%)] [3:Exposed: Yes Fascia: No Tendon: No Muscle: No Joint: No Bone: No Small (1-33%)] [N/A:N/A] [name] Notes Electronic Signature(s) Signed: 05/25/2019 5:51:03 PM By: Kela Millin Signed: 05/25/2019 6:02:27 PM By: Linton Ham MD Entered By: Linton Ham on 05/25/2019 12:53:59 -------------------------------------------------------------------------------- Multi-Disciplinary Care Plan Details Patient Name: Date of Service: [name] Shanks. 05/25/2019 11:00 A M Medical Record Number: [id number] Patient Account Number: [id number] Date of Birth/Sex: Treating RN: 01-04-1918 (86 y.o. [name] [name] Care Pragya Lofaso: [name] [name] Other Clinician: Referring Danen Lapaglia: Treating Hanaan Gancarz/Extender: [name] [name] in [name]: 6 Active Inactive Abuse / Safety / Falls / Self Care Management Nursing Diagnoses: Potential for falls Goals: Patient will remain injury free related to  falls Date Initiated: 04/12/2019 Target Resolution Date: 06/22/2019 Goal Status: Active Patient/caregiver will verbalize understanding of skin care regimen Date Initiated: 04/12/2019 Date Inactivated: 05/25/2019 Target Resolution Date: 05/25/2019 Goal Status: Met Interventions: Provide education on fall prevention Notes: Wound/Skin Impairment Nursing Diagnoses: Knowledge deficit related to ulceration/compromised skin integrity Goals: Ulcer/skin breakdown will heal within 14 weeks Date Initiated: 04/12/2019 Target Resolution Date: 08/03/2019 Goal Status: Active Interventions: Assess patient/caregiver ability to obtain necessary supplies Assess patient/caregiver ability to perform ulcer/skin care regimen upon admission and as needed Provide education on ulcer and skin care [name] Activities: Skin care regimen initiated : 04/12/2019 Topical wound management initiated : 04/12/2019 Notes: Electronic Signature(s) Signed: 05/25/2019 5:50:44 PM By: Baruch Gouty RN, BSN Entered By: Baruch Gouty on 05/25/2019 11:28:19 -------------------------------------------------------------------------------- Pain Assessment Details Patient Name: Date of Service: [name] Shanks. 05/25/2019 11:00 A M Medical Record Number: [id number] Patient Account Number: [id number] Date of Birth/Sex: Treating RN: Mar 02, 1917 (85 y.o. [name] [name] [name] Care Keilyn Nadal: [name] [name] Other Clinician: Referring Acie Custis: Treating Reiley Bertagnolli/Extender: [name] [name] in [name]: 6 Active Problems Location of Pain Severity and Description of Pain Patient Has Paino No Site Locations Pain Management and Medication Current Pain Management: Electronic Signature(s) Signed: 05/25/2019 5:51:03 PM By: Kela Millin Signed: 06/06/2019 9:09:16 AM By: [name] [name] Entered By: [name] [name] on 05/25/2019  11:39:07 -------------------------------------------------------------------------------- Patient/Caregiver Education Details Patient Name: Date of Service: [place], [name] RY M. 4/30/2021andnbsp11:00 E. Lopez Record Number: [id number] Patient Account Number: [id number] Date of Birth/Gender: Treating RN: August 29, 1917 (86 y.o. [name] [name] Care Physician: [name] [name] Other Clinician: Referring Physician: Treating Physician/Extender: [name] [name] in [name]: 6 Education Assessment Education Provided To: Patient Education Topics Provided Wound/Skin Impairment: Methods: Explain/Verbal Responses: Reinforcements needed, State content correctly Electronic Signature(s) Signed: 05/25/2019 5:50:44 PM By: Baruch Gouty RN, BSN Entered By: Baruch Gouty on 05/25/2019 11:28:53 -------------------------------------------------------------------------------- Wound Assessment Details Patient Name: Date of Service: [name] Shanks. 05/25/2019 11:00 A M Medical Record Number: [id number] Patient Account Number: [id number] Date of Birth/Sex: Treating RN: Jun 21, 1917 (86 y.o. [name] [name] [name] Care Gwendelyn Lanting: [name] [name] Other Clinician: Referring Lucas Exline: Treating Aireonna Bauer/Extender: [name] [name] in [name]: 6 Wound Status Wound Number: 2 [name] Arterial Insufficiency Ulcer Etiology: Wound Location: Left, Lateral Malleolus Wound Open Wounding Event: Gradually Appeared Status: Date Acquired: 02/22/2019 Comorbid Cataracts, Chronic sinus problems/congestion, Anemia, Angina, Weeks Of [name]: 6 History: Arrhythmia, Hypertension, Peripheral Arterial Disease, Peripheral Clustered Wound: No Venous Disease, Osteoarthritis, Received Chemotherapy Photos Photo Uploaded  ByMikeal Hawthorne on 05/28/2019 14:52:16 Wound Measurements Length: (cm) 1.1 Width: (cm) 1.5 Depth: (cm) 0.1 Area: (cm) 1.296 Volume: (cm)  0.13 % Reduction in Area: 19.1% % Reduction in Volume: 18.8% Epithelialization: Small (1-33%) Tunneling: No Undermining: No Wound Description Classification: Full Thickness Without Exposed Support Structures Wound Margin: Distinct, outline attached Exudate Amount: Small Exudate Type: Serosanguineous Exudate Color: red, brown Foul Odor After Cleansing: No Slough/Fibrino Yes Wound Bed Granulation Amount: Large (67-100%) Exposed Structure Granulation Quality: Pink Fascia Exposed: No Necrotic Amount: Small (1-33%) Fat Layer (Subcutaneous Tissue) Exposed: Yes Necrotic Quality: Adherent Slough Tendon Exposed: No Muscle Exposed: No Joint Exposed: No Bone Exposed: No Electronic Signature(s) Signed: 05/25/2019 5:50:39 PM By: Levan Hurst RN, BSN Signed: 05/25/2019 5:51:03 PM By: Kela Millin Entered By: Levan Hurst on 05/25/2019 11:50:40 -------------------------------------------------------------------------------- Wound Assessment Details Patient Name: Date of Service: [name] Shanks. 05/25/2019 11:00 A M Medical Record Number: [id number] Patient Account Number: [id number] Date of Birth/Sex: Treating RN: 07/25/1917 (86 y.o. [name] [name] [name] Care Amiliah Campisi: [name] [name] Other Clinician: Referring Janyth Riera: Treating Yuri Flener/Extender: [name] [name] in [name]: 6 Wound Status Wound Number: 3 [name] Pressure Ulcer Etiology: Wound Location: Left, Lateral Calcaneus Wound Open Wounding Event: Pressure Injury Status: Date Acquired: 04/04/2019 Comorbid Cataracts, Chronic sinus problems/congestion, Anemia, Angina, Weeks Of [name]: 6 History: Arrhythmia, Hypertension, Peripheral Arterial Disease, Peripheral Clustered Wound: No Venous Disease, Osteoarthritis, Received Chemotherapy Photos Photo Uploaded By: Mikeal Hawthorne on 05/28/2019 14:52:16 Wound Measurements Length: (cm) 0.3 Width: (cm) 0.2 Depth: (cm) 0.2 Area: (cm)  0.047 Volume: (cm) 0.009 % Reduction in Area: 71.5% % Reduction in Volume: 72.7% Epithelialization: Small (1-33%) Tunneling: No Undermining: No Wound Description Classification: Category/Stage III Wound Margin: Flat and Intact Exudate Amount: Small Exudate Type: Serous Exudate Color: amber Foul Odor After Cleansing: No Slough/Fibrino Yes Wound Bed Granulation Amount: Large (67-100%) Exposed Structure Granulation Quality: Pink Fascia Exposed: No Necrotic Amount: Small (1-33%) Fat Layer (Subcutaneous Tissue) Exposed: Yes Necrotic Quality: Adherent Slough Tendon Exposed: No Muscle Exposed: No Joint Exposed: No Bone Exposed: No Electronic Signature(s) Signed: 05/25/2019 5:50:39 PM By: Levan Hurst RN, BSN Signed: 05/25/2019 5:51:03 PM By: Kela Millin Entered By: Levan Hurst on 05/25/2019 11:51:33 -------------------------------------------------------------------------------- Vitals Details Patient Name: Date of Service: [name] Shanks. 05/25/2019 11:00 A M Medical Record Number: [id number] Patient Account Number: [id number] Date of Birth/Sex: Treating RN: 12-Feb-1917 (86 y.o. [name] [name] [name] Care Jabez Molner: [name] [name] Other Clinician: Referring Austyn Perriello: Treating Marylon Verno/Extender: [name] [name] in [name]: 6 Vital Signs Time Taken: 11:36 Temperature (F): 98.3 Height (in): 67 Pulse (bpm): 63 Weight (lbs): 124 Respiratory Rate (breaths/min): 18 Body Mass Index (BMI): 19.4 Blood Pressure (mmHg): 121/32 Reference Range: 80 - 120 mg / dl Electronic Signature(s) Signed: 06/06/2019 9:09:16 AM By: [name] [name] Entered By: [name] [name] on 05/25/2019 11:39:02

## 2019-06-08 ENCOUNTER — Encounter (HOSPITAL_BASED_OUTPATIENT_CLINIC_OR_DEPARTMENT_OTHER): Payer: Medicare Other | Attending: Internal Medicine | Admitting: Internal Medicine

## 2019-06-08 DIAGNOSIS — L89623 Pressure ulcer of left heel, stage 3: Secondary | ICD-10-CM | POA: Insufficient documentation

## 2019-06-08 DIAGNOSIS — E785 Hyperlipidemia, unspecified: Secondary | ICD-10-CM | POA: Diagnosis not present

## 2019-06-08 DIAGNOSIS — E039 Hypothyroidism, unspecified: Secondary | ICD-10-CM | POA: Insufficient documentation

## 2019-06-08 DIAGNOSIS — Z7901 Long term (current) use of anticoagulants: Secondary | ICD-10-CM | POA: Insufficient documentation

## 2019-06-08 DIAGNOSIS — I70243 Atherosclerosis of native arteries of left leg with ulceration of ankle: Secondary | ICD-10-CM | POA: Diagnosis not present

## 2019-06-08 DIAGNOSIS — N182 Chronic kidney disease, stage 2 (mild): Secondary | ICD-10-CM | POA: Diagnosis not present

## 2019-06-08 DIAGNOSIS — I129 Hypertensive chronic kidney disease with stage 1 through stage 4 chronic kidney disease, or unspecified chronic kidney disease: Secondary | ICD-10-CM | POA: Insufficient documentation

## 2019-06-12 NOTE — Progress Notes (Signed)
DANE, GEMELLI ([id number]) Visit Report for 06/08/2019 SuperBill Details Patient Name: Date of Service: [place], [name]. 06/08/2019 Medical Record Number: [id number] Patient Account Number: [id number] Date of Birth/Sex: Treating RN: 06/27/1917 (86 y.o. 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

## 2019-06-12 NOTE — Progress Notes (Signed)
[name] [name], [name] [name] ([id number]) Visit Report for 06/08/2019 Arrival Information Details Patient Name: Date of Service: [name] [name], [name] [name]. 06/08/2019 11:15 A M Medical Record Number: [id number] Patient Account Number: [id number] Date of Birth/Sex: Treating RN: [name] [name] (86 y.o. [name] [name] Primary Care Atlas Crossland: Jerlyn Ly Other Clinician: Referring Arshia Spellman: Treating Caretha Rumbaugh/Extender: Leigh Aurora in Treatment: 8 Visit Information History Since Last Visit All ordered tests and consults were completed: No Patient Arrived: Wheel Chair Added or deleted any medications: No Arrival Time: 11:15 Any new allergies or adverse reactions: No Accompanied By: daughter Had a fall or experienced change in No Transfer Assistance: None activities of daily living that may affect Patient Identification Verified: Yes risk of falls: Secondary Verification Process Completed: Yes Signs or symptoms of abuse/neglect since last visito No Patient Requires Transmission-Based Precautions: No Hospitalized since last visit: No Patient Has Alerts: Yes Implantable device outside of the clinic excluding No Patient Alerts: Patient on Blood Thinner cellular tissue based products placed in the center ABI L: 1.5 since last visit: TBI L: 0.36 Has Dressing in Place as Prescribed: Yes Pain Present Now: No Electronic Signature(s) Signed: 5/[name]/2021 5:13:38 PM By: Carlene Coria RN Entered By: Carlene Coria on 06/08/2019 11:19:34 -------------------------------------------------------------------------------- Clinic Level of Care Assessment Details Patient Name: Date of Service: [name] [name], [name] [name] 06/08/2019 11:15 A M Medical Record Number: [id number] Patient Account Number: [id number] Date of Birth/Sex: Treating RN: [birth year] (86 y.o. [name] [name] Primary Care Nathan Moctezuma: Jerlyn Ly Other Clinician: Referring Graviela Nodal: Treating Norlan Rann/Extender: Leigh Aurora in  Treatment: 8 Clinic Level of Care Assessment Items TOOL 4 Quantity Score X- 1 0 Use when only an EandM is performed on FOLLOW-UP visit ASSESSMENTS - Nursing Assessment / Reassessment X- 1 10 Reassessment of Co-morbidities (includes updates in patient status) X- 1 5 Reassessment of Adherence to Treatment Plan ASSESSMENTS - Wound and Skin A ssessment / Reassessment []  - 0 Simple Wound Assessment / Reassessment - one wound X- 2 5 Complex Wound Assessment / Reassessment - multiple wounds []  - 0 Dermatologic / Skin Assessment (not related to wound area) ASSESSMENTS - Focused Assessment []  - 0 Circumferential Edema Measurements - multi extremities []  - 0 Nutritional Assessment / Counseling / Intervention []  - 0 Lower Extremity Assessment (monofilament, tuning fork, pulses) []  - 0 Peripheral Arterial Disease Assessment (using hand held doppler) ASSESSMENTS - Ostomy and/or Continence Assessment and Care []  - 0 Incontinence Assessment and Management []  - 0 Ostomy Care Assessment and Management (repouching, etc.) PROCESS - Coordination of Care []  - 0 Simple Patient / Family Education for ongoing care []  - 0 Complex (extensive) Patient / Family Education for ongoing care X- 1 10 Staff obtains Programmer, systems, Records, T Results / Process Orders est []  - 0 Staff telephones HHA, Nursing Homes / Clarify orders / etc []  - 0 Routine Transfer to another Facility (non-emergent condition) []  - 0 Routine Hospital Admission (non-emergent condition) []  - 0 New Admissions / Biomedical engineer / Ordering NPWT Apligraf, etc. , []  - 0 Emergency Hospital Admission (emergent condition) X- 1 10 Simple Discharge Coordination []  - 0 Complex (extensive) Discharge Coordination PROCESS - Special Needs []  - 0 Pediatric / Minor Patient Management []  - 0 Isolation Patient Management []  - 0 Hearing / Language / Visual special needs []  - 0 Assessment of Community assistance (transportation, D/C  planning, etc.) []  - 0 Additional assistance / Altered mentation []  - 0 Support Surface(s) Assessment (bed, cushion, seat, etc.) INTERVENTIONS -  Wound Cleansing / Measurement []  - 0 Simple Wound Cleansing - one wound X- 2 5 Complex Wound Cleansing - multiple wounds []  - 0 Wound Imaging (photographs - any number of wounds) []  - 0 Wound Tracing (instead of photographs) []  - 0 Simple Wound Measurement - one wound X- 2 5 Complex Wound Measurement - multiple wounds INTERVENTIONS - Wound Dressings []  - 0 Small Wound Dressing one or multiple wounds X- 1 15 Medium Wound Dressing one or multiple wounds []  - 0 Large Wound Dressing one or multiple wounds X- 1 5 Application of Medications - topical []  - 0 Application of Medications - injection INTERVENTIONS - Miscellaneous []  - 0 External ear exam []  - 0 Specimen Collection (cultures, biopsies, blood, body fluids, etc.) []  - 0 Specimen(s) / Culture(s) sent or taken to Lab for analysis []  - 0 Patient Transfer (multiple staff / Civil Service fast streamer / Similar devices) []  - 0 Simple Staple / Suture removal (25 or less) []  - 0 Complex Staple / Suture removal (26 or more) []  - 0 Hypo / Hyperglycemic Management (close monitor of Blood Glucose) []  - 0 Ankle / Brachial Index (ABI) - do not check if billed separately X- 1 5 Vital Signs Has the patient been seen at the hospital within the last three years: Yes Total Score: 90 Level Of Care: New/Established - Level 3 Electronic Signature(s) Signed: 5/[name]/2021 5:13:38 PM By: Carlene Coria RN Entered By: Carlene Coria on 06/08/2019 11:19:07 -------------------------------------------------------------------------------- Encounter Discharge Information Details Patient Name: Date of Service: [name] [name]. 06/08/2019 11:15 A M Medical Record Number: [id number] Patient Account Number: [id number] Date of Birth/Sex: Treating RN: [name] [name][birth year] (86 y.o. [name] [name] Primary Care Margel Joens: Jerlyn Ly Other Clinician: Referring Tosha Belgarde: Treating Monnie Gudgel/Extender: Leigh Aurora in Treatment: 8 Encounter Discharge Information Items Discharge Condition: Stable Ambulatory Status: Wheelchair Discharge Destination: Home Transportation: Private Auto Accompanied By: daughter Schedule Follow-up Appointment: Yes Clinical Summary of Care: Patient Declined Electronic Signature(s) Signed: 5/[name]/2021 5:13:38 PM By: Carlene Coria RN Entered By: Carlene Coria on 06/08/2019 11:[name]:13 -------------------------------------------------------------------------------- Patient/Caregiver Education Details Patient Name: Date of Service: [name] [name] 5/14/2021andnbsp11:15 A M Medical Record Number: [id number] Patient Account Number: [id number] Date of Birth/Gender: Treating RN: [birth year] (86 y.o. [name] [name] Primary Care Physician: Jerlyn Ly Other Clinician: Referring Physician: Treating Physician/Extender: Leigh Aurora in Treatment: 8 Education Assessment Education Provided To: Patient Education Topics Provided Wound/Skin Impairment: Methods: Explain/Verbal Responses: State content correctly Electronic Signature(s) Signed: 5/[name]/2021 5:13:38 PM By: Carlene Coria RN Entered By: Carlene Coria on 06/08/2019 11:17:54 -------------------------------------------------------------------------------- Wound Assessment Details Patient Name: Date of Service: [name] [name]. 06/08/2019 11:15 A M Medical Record Number: [id number] Patient Account Number: [id number] Date of Birth/Sex: Treating RN: [name] [name][birth year] (86 y.o. [name] [name] Primary Care Cera Rorke: Jerlyn Ly Other Clinician: Referring Maryah Marinaro: Treating Agripina Guyette/Extender: Leigh Aurora in Treatment: 8 Wound Status Wound Number: 2 Primary Arterial Insufficiency Ulcer Etiology: Wound Location: Left, Lateral Malleolus Wound Open Wounding Event: Gradually  Appeared Status: Date Acquired: 02/22/2019 Comorbid Cataracts, Chronic sinus problems/congestion, Anemia, Angina, Weeks Of Treatment: 8 History: Arrhythmia, Hypertension, Peripheral Arterial Disease, Peripheral Clustered Wound: No Venous Disease, Osteoarthritis, Received Chemotherapy Wound Measurements Length: (cm) 1.1 Width: (cm) 1.5 Depth: (cm) 0.1 Area: (cm) 1.296 Volume: (cm) 0.13 % Reduction in Area: 19.1% % Reduction in Volume: [name].8% Epithelialization: Small (1-33%) Tunneling: No Undermining: No Wound Description Classification: Full Thickness Without Exposed Support Structures Wound Margin: Distinct,  outline attached Exudate Amount: Small Exudate Type: Serosanguineous Exudate Color: red, brown Foul Odor After Cleansing: No Slough/Fibrino Yes Wound Bed Granulation Amount: Large (67-100%) Exposed Structure Granulation Quality: Pink Fascia Exposed: No Necrotic Amount: Small (1-33%) Fat Layer (Subcutaneous Tissue) Exposed: Yes Necrotic Quality: Adherent Slough Tendon Exposed: No Muscle Exposed: No Joint Exposed: No Bone Exposed: No Treatment Notes Wound #2 (Left, Lateral Malleolus) 1. Cleanse With Wound Cleanser 3. Primary Dressing Applied Polymem Ag 4. Secondary Dressing Dry Gauze Roll Gauze Heel Cup 5. Secured With Recruitment consultant) Signed: 5/[name]/2021 5:13:38 PM By: Carlene Coria RN Entered By: Carlene Coria on 06/08/2019 11:16:32 -------------------------------------------------------------------------------- Wound Assessment Details Patient Name: Date of Service: [name] [name], TOLEN. 06/08/2019 11:15 A M Medical Record Number: [id number] Patient Account Number: [id number] Date of Birth/Sex: Treating RN: [name] [name], [name] [name] (86 y.o. [name] [name] Primary Care Dannell Raczkowski: Jerlyn Ly Other Clinician: Referring Areonna Bran: Treating Angelynn Lemus/Extender: Leigh Aurora in Treatment: 8 Wound Status Wound Number: 3 Primary Pressure  Ulcer Etiology: Wound Location: Left, Lateral Calcaneus Wound Open Wounding Event: Pressure Injury Status: Date Acquired: 04/04/2019 Comorbid Cataracts, Chronic sinus problems/congestion, Anemia, Angina, Weeks Of Treatment: 8 History: Arrhythmia, Hypertension, Peripheral Arterial Disease, Peripheral Clustered Wound: No Venous Disease, Osteoarthritis, Received Chemotherapy Wound Measurements Length: (cm) 0.3 Width: (cm) 0.2 Depth: (cm) 0.2 Area: (cm) 0.047 Volume: (cm) 0.009 % Reduction in Area: 71.5% % Reduction in Volume: 72.7% Epithelialization: Small (1-33%) Tunneling: No Undermining: No Wound Description Classification: Category/Stage III Wound Margin: Flat and Intact Exudate Amount: Small Exudate Type: Serous Exudate Color: amber Foul Odor After Cleansing: No Slough/Fibrino Yes Wound Bed Granulation Amount: Large (67-100%) Exposed Structure Granulation Quality: Pink Fascia Exposed: No Necrotic Amount: Small (1-33%) Fat Layer (Subcutaneous Tissue) Exposed: Yes Necrotic Quality: Adherent Slough Tendon Exposed: No Muscle Exposed: No Joint Exposed: No Bone Exposed: No Treatment Notes Wound #3 (Left, Lateral Calcaneus) 1. Cleanse With Wound Cleanser 3. Primary Dressing Applied Polymem Ag 4. Secondary Dressing Dry Gauze Roll Gauze Heel Cup 5. Secured With Recruitment consultant) Signed: 5/[name]/2021 5:13:38 PM By: Carlene Coria RN Entered By: Carlene Coria on 06/08/2019 11:16:51 -------------------------------------------------------------------------------- [place] Details Patient Name: Date of Service: [name] Setting M. 06/08/2019 11:15 A M Medical Record Number: [id number] Patient Account Number: [id number] Date of Birth/Sex: Treating RN: [birth year] (86 y.o. [name] [name] Primary Care Trestan Vahle: Jerlyn Ly Other Clinician: Referring Aleisha Paone: Treating Deep Bonawitz/Extender: Leigh Aurora in Treatment: 8 Vital Signs Time  Taken: 11:07 Temperature (F): 97.7 Height (in): 67 Pulse (bpm): 60 Weight (lbs): 124 Respiratory Rate (breaths/min): [name] Body Mass Index (BMI): 19.4 Blood Pressure (mmHg): 137/62 Reference Range: 80 - 120 mg / dl Electronic Signature(s) Signed: 5/[name]/2021 5:13:38 PM By: Carlene Coria RN Entered By: Carlene Coria on 06/08/2019 11:16:06

## 2019-06-22 ENCOUNTER — Encounter (HOSPITAL_BASED_OUTPATIENT_CLINIC_OR_DEPARTMENT_OTHER): Payer: Medicare Other | Admitting: Internal Medicine

## 2019-06-22 DIAGNOSIS — N182 Chronic kidney disease, stage 2 (mild): Secondary | ICD-10-CM | POA: Diagnosis not present

## 2019-06-22 DIAGNOSIS — Z7901 Long term (current) use of anticoagulants: Secondary | ICD-10-CM | POA: Diagnosis not present

## 2019-06-22 DIAGNOSIS — I129 Hypertensive chronic kidney disease with stage 1 through stage 4 chronic kidney disease, or unspecified chronic kidney disease: Secondary | ICD-10-CM | POA: Diagnosis not present

## 2019-06-22 DIAGNOSIS — L89623 Pressure ulcer of left heel, stage 3: Secondary | ICD-10-CM | POA: Diagnosis not present

## 2019-06-22 DIAGNOSIS — E039 Hypothyroidism, unspecified: Secondary | ICD-10-CM | POA: Diagnosis not present

## 2019-06-22 DIAGNOSIS — I70243 Atherosclerosis of native arteries of left leg with ulceration of ankle: Secondary | ICD-10-CM | POA: Diagnosis not present

## 2019-06-22 NOTE — Progress Notes (Signed)
[name] [name], [name] [name] ([id number]) Visit Report for 06/22/2019 HPI Details Patient Name: Date of Service: [place], [name]. 06/22/2019 1:00 PM Medical Record Number: [id number] Patient Account Number: [id number] Date of Birth/Sex: Treating RN: 1917/07/06 (86 y.o. [name] Primary Care Provider: Jerlyn [name] Other Clinician: Referring Provider: Treating Provider/Extender: [name] [name] in Treatment: 10 History of Present Illness Location: Patient presents with an ulcer on the left lateral ankle. Quality: Patient reports No Pain. Severity: Patient states wound(s) are getting worse. Duration: Patient has had the wound for > 6 months prior to seeking treatment at the wound center Timing: Pain in wound is Intermittent (comes and goes Context: The wound would happen gradually Modifying Factors: the ulcer has been persistent after treatment for at least 3 months and at the present time collagen and has been ssociated Signs and Symptoms: Patient reports presence of swelling A HPI Description: Pleasant 86 year old patient who has no significant medical problems except mild high blood pressure, cardiac irregularity and generalized aches and pains. She presented to the podiatrist Dr. Melony Overly for a left lateral ankle ulceration and has been treated for about 3 months without resolution. The patient was referred to [place] for further care. The patient has not had any vascular study done except for a DVT study done about 2 years ago which was negative for DVT of the lower extremities. 09/11/2014 -- the patient had her vascular study done today but the reports are not ready yet. 09/18/2014 -- the ABI was not measured due to having noncompressible vessels due to medial calcification. However the bilateral tibial artery Doppler waveform in the TBI was normal and no further tests were recommended. 10/30/2014 -- she has developed a superficial abrasion just medial and superior to her  previous wound. 11/13/2014 -- she has been authorized to get the tri-layer Oasis. She has done very well with her [place] last week. 12/18/2014 -- her wound has done very well but stalled a bit due to possible superimposed tape burn. After cleaning the wound I believe it's time to use her tri- layer Oasis. 12/25/2014 -- She has had her Second application of Oasis. 01/01/2015 -- her wound looks excellent today and we will not use the next application of Oasis. 01/15/2015 -- she is here for review after 2 weeks due to scheduling issues. she has been doing the dressing as advised READMISSION 04/12/2019 Patient is now 86 year old woman. She was here in the wound care center in 2016 under the care of Dr. Con Memos with a left lateral ankle ulcer. Eventually this healed over however an area in the same location opened up at the end of January. She is known to have PAD and follow-up arterial studies were ordered on 3/12. This showed noncompressible ABIs bilaterally at 1.51. Great toe pressures were only 61 on the right and 65 on the left resulting in TBI's of 0.36 and 0.39 respectively. The patient apparently declined any invasive procedures including angiography. About a week ago she had another wound open up on the lateral part of her heel just above the weightbearing plantar part of the heel. It is felt that this was probably a pressure ulcer. The patient is not a diabetic or smoker. She is not complaining of a lot of pain. She does complain of the heel more than her lateral malleolus. Past medical history includes sick sinus syndrome on Eliquis, hypothyroidism, macular degeneration, hypertension, hyperlipidemia, stage II chronic renal failure apparently some history of malignancy involving her stomach  3/25; patient with known PAD who does not want any further consideration of interventions. She has areas on her left lateral malleolus and left lateral heel. Using silver collagen. She did  not tolerate bunny boots to offload the heel so they have been using a long pillow. Her daughter is changing the dressings 4/9; patient with known PAD who does not want to pursue any further investigations. She is complaining of minimal pain. We have been using silver collagen to to wound areas 1 on the left lateral malleolus and the other on the left lateral heel just above the plantar surface. Her daughter is religiously offloading these areas especially can 4/30; patient with known PAD does not wish to pursue any further investigations. And she complains of minimal amounts of pain. She has an area on the left lateral malleolus and the left lateral heel. We have been using silver collagen. Change this to polymen Ag this week 06/22/19-Patient here for 1 month checkup, both wounds on the left leg are stable and doing well we are using PolyMem silver Electronic Signature(s) Signed: 06/22/2019 1:27:24 PM By: Tobi Bastos MD, MBA Entered By: Tobi Bastos on 06/22/2019 13:27:24 -------------------------------------------------------------------------------- Physical Exam Details Patient Name: Date of Service: [name] [name]. 06/22/2019 1:00 PM Medical Record Number: [id number] Patient Account Number: [id number] Date of Birth/Sex: Treating RN: [birth year] (86 y.o. [name] Primary Care Provider: Jerlyn [name] Other Clinician: Referring Provider: Treating Provider/Extender: [name] [name] in Treatment: 10 Constitutional alert and oriented x 3. sitting or standing blood pressure is within target range for patient.. supine blood pressure is within target range for patient.. pulse regular and within target range for patient.[name] [name] respirations regular, non-labored and within target range for patient.[name] [name] temperature within target range for patient.. . . Well- nourished and well-hydrated in no acute distress. Notes Left lateral malleoli wound and left heel wound are both  small and surrounding skin intact with no redness cellulitis or signs of infection Electronic Signature(s) Signed: 06/22/2019 1:27:54 PM By: Tobi Bastos MD, MBA Entered By: Tobi Bastos on 06/22/2019 13:27:53 -------------------------------------------------------------------------------- Physician Orders Details Patient Name: Date of Service: [name] [name]. 06/22/2019 1:00 PM Medical Record Number: [id number] Patient Account Number: [id number] Date of Birth/Sex: Treating RN: 1917/09/21 (86 y.o. [name] Primary Care Provider: Jerlyn [name] Other Clinician: Referring Provider: Treating Provider/Extender: [name] [name] in Treatment: 10 Verbal / Phone Orders: No Diagnosis Coding ICD-10 Coding Code Description [phone] Atherosclerosis of native arteries of left leg with ulceration of ankle L89.623 Pressure ulcer of left heel, stage 3 Follow-up Appointments Return appointment in 3 weeks. Dressing Change Frequency Change Dressing every other day. Skin Barriers/Peri-Wound Care Wound #2 Left,Lateral Malleolus Skin Prep Wound Cleansing May shower with protection. - protect the dressing when showering on days not changing the dressing. May shower and wash wound with soap and water. - with dressing changes only. Primary Wound Dressing Wound #2 Left,Lateral Malleolus Polymem Silver Wound #3 Left,Lateral Calcaneus Polymem Silver Secondary Dressing Wound #3 Left,Lateral Calcaneus Kerlix/Rolled Gauze Dry Gauze Foam Border - or bordered gauze Heel Cup Edema Control Avoid standing for long periods of time Elevate legs to the level of the heart or above for 30 minutes daily and/or when sitting, a frequency of: - throughout the day. Off-Loading Open toe surgical shoe to: - left foot. Other: - Patient to wear bunny boot to aid in relieving pressure from left heel and ankle. Additional Orders / Instructions Follow Nutritious Diet -  increase  protein and vegetables to aid in wound healing. Electronic Signature(s) Signed: 06/22/2019 4:30:26 PM By: Tobi Bastos MD, MBA Signed: 06/22/2019 4:59:31 PM By: Kela Millin Entered By: Kela Millin on 06/22/2019 13:26:52 -------------------------------------------------------------------------------- Problem List Details Patient Name: Date of Service: [name] [name]. 06/22/2019 1:00 PM Medical Record Number: [id number] Patient Account Number: [id number] Date of Birth/Sex: Treating RN: 1918-01-07 (86 y.o. [name] Primary Care Provider: Jerlyn [name] Other Clinician: Referring Provider: Treating Provider/Extender: [name] [name] in Treatment: 10 Active Problems ICD-10 Encounter Code Description Active Date MDM Diagnosis I70.243 Atherosclerosis of native arteries of left leg with ulceration of ankle 04/12/2019 No Yes L89.623 Pressure ulcer of left heel, stage 3 04/12/2019 No Yes Inactive Problems Resolved Problems Electronic Signature(s) Signed: 06/22/2019 4:30:26 PM By: Tobi Bastos MD, MBA Signed: 06/22/2019 4:59:31 PM By: Kela Millin Entered By: Kela Millin on 06/22/2019 13:14:32 -------------------------------------------------------------------------------- Progress Note Details Patient Name: Date of Service: [name] [name]. 06/22/2019 1:00 PM Medical Record Number: [id number] Patient Account Number: [id number] Date of Birth/Sex: Treating RN: 11/14/1917 (86 y.o. [name] Primary Care Provider: Jerlyn [name] Other Clinician: Referring Provider: Treating Provider/Extender: [name] [name] in Treatment: 10 Subjective History of Present Illness (HPI) The following HPI elements were documented for the patient's wound: Location: Patient presents with an ulcer on the left lateral ankle. Quality: Patient reports No Pain. Severity: Patient states wound(s) are getting  worse. Duration: Patient has had the wound for > 6 months prior to seeking treatment at the wound center Timing: Pain in wound is Intermittent (comes and goes Context: The wound would happen gradually Modifying Factors: the ulcer has been persistent after treatment for at least 3 months and at the present time collagen and has been Associated Signs and Symptoms: Patient reports presence of swelling Pleasant 86 year old patient who has no significant medical problems except mild high blood pressure, cardiac irregularity and generalized aches and pains. She presented to the podiatrist Dr. Melony Overly for a left lateral ankle ulceration and has been treated for about 3 months without resolution. The patient was referred to [place] for further care. The patient has not had any vascular study done except for a DVT study done about 2 years ago which was negative for DVT of the lower extremities. 09/11/2014 -- the patient had her vascular study done today but the reports are not ready yet. 09/18/2014 -- the ABI was not measured due to having noncompressible vessels due to medial calcification. However the bilateral tibial artery Doppler waveform in the TBI was normal and no further tests were recommended. 10/30/2014 -- she has developed a superficial abrasion just medial and superior to her previous wound. 11/13/2014 -- she has been authorized to get the tri-layer Oasis. She has done very well with her [place] last week. 12/18/2014 -- her wound has done very well but stalled a bit due to possible superimposed tape burn. After cleaning the wound I believe it's time to use her tri- layer Oasis. 12/25/2014 -- She has had her Second application of Oasis. 01/01/2015 -- her wound looks excellent today and we will not use the next application of Oasis. 01/15/2015 -- she is here for review after 2 weeks due to scheduling issues. she has been doing the dressing as advised READMISSION 04/12/2019 Patient is  now 86 year old woman. She was here in the wound care center in 2016 under the care of Dr. Con Memos with a left lateral ankle ulcer. Eventually  this healed over however an area in the same location opened up at the end of January. She is known to have PAD and follow-up arterial studies were ordered on 3/12. This showed noncompressible ABIs bilaterally at 1.51. Great toe pressures were only 61 on the right and 65 on the left resulting in TBI's of 0.36 and 0.39 respectively. The patient apparently declined any invasive procedures including angiography. About a week ago she had another wound open up on the lateral part of her heel just above the weightbearing plantar part of the heel. It is felt that this was probably a pressure ulcer. The patient is not a diabetic or smoker. She is not complaining of a lot of pain. She does complain of the heel more than her lateral malleolus. Past medical history includes sick sinus syndrome on Eliquis, hypothyroidism, macular degeneration, hypertension, hyperlipidemia, stage II chronic renal failure apparently some history of malignancy involving her stomach 3/25; patient with known PAD who does not want any further consideration of interventions. She has areas on her left lateral malleolus and left lateral heel. Using silver collagen. She did not tolerate bunny boots to offload the heel so they have been using a long pillow. Her daughter is changing the dressings 4/9; patient with known PAD who does not want to pursue any further investigations. She is complaining of minimal pain. We have been using silver collagen to to wound areas 1 on the left lateral malleolus and the other on the left lateral heel just above the plantar surface. Her daughter is religiously offloading these areas especially can 4/30; patient with known PAD does not wish to pursue any further investigations. And she complains of minimal amounts of pain. She has an area on the left lateral malleolus  and the left lateral heel. We have been using silver collagen. Change this to polymen Ag this week 06/22/19-Patient here for 1 month checkup, both wounds on the left leg are stable and doing well we are using PolyMem silver Objective Constitutional alert and oriented x 3. sitting or standing blood pressure is within target range for patient.. supine blood pressure is within target range for patient.. pulse regular and within target range for patient.[name] [name] respirations regular, non-labored and within target range for patient.[name] [name] temperature within target range for patient.. Well- nourished and well-hydrated in no acute distress. Vitals Time Taken: 1:13 PM, Height: 67 in, Weight: 124 lbs, BMI: 19.4, Temperature: 97.7 F, Pulse: 61 bpm, Respiratory Rate: 18 breaths/min, Blood Pressure: 147/58 mmHg. General Notes: Left lateral malleoli wound and left heel wound are both small and surrounding skin intact with no redness cellulitis or signs of infection Integumentary (Hair, Skin) Wound #2 status is Open. Original cause of wound was Gradually Appeared. The wound is located on the Left,Lateral Malleolus. The wound measures 0.3cm length x 0.3cm width x 0.1cm depth; 0.071cm^2 area and 0.007cm^3 volume. There is Fat Layer (Subcutaneous Tissue) Exposed exposed. There is no tunneling or undermining noted. There is a medium amount of serosanguineous drainage noted. The wound margin is distinct with the outline attached to the wound base. There is large (67-100%) pink granulation within the wound bed. There is a small (1-33%) amount of necrotic tissue within the wound bed including Adherent Slough. Wound #3 status is Open. Original cause of wound was Pressure Injury. The wound is located on the Left,Lateral Calcaneus. The wound measures 0.3cm length x 0.3cm width x 0.2cm depth; 0.071cm^2 area and 0.014cm^3 volume. There is Fat Layer (Subcutaneous Tissue) Exposed exposed. There is  no tunneling or undermining noted.  There is a medium amount of serosanguineous drainage noted. The wound margin is flat and intact. There is large (67-100%) pink granulation within the wound bed. There is a small (1-33%) amount of necrotic tissue within the wound bed including Adherent Slough. Assessment Active Problems ICD-10 Atherosclerosis of native arteries of left leg with ulceration of ankle Pressure ulcer of left heel, stage 3 Plan Follow-up Appointments: Return appointment in 3 weeks. Dressing Change Frequency: Change Dressing every other day. Skin Barriers/Peri-Wound Care: Wound #2 Left,Lateral Malleolus: Skin Prep Wound Cleansing: May shower with protection. - protect the dressing when showering on days not changing the dressing. May shower and wash wound with soap and water. - with dressing changes only. Primary Wound Dressing: Wound #2 Left,Lateral Malleolus: Polymem Silver Wound #3 Left,Lateral Calcaneus: Polymem Silver Secondary Dressing: Wound #3 Left,Lateral Calcaneus: Kerlix/Rolled Gauze Dry Gauze Foam Border - or bordered gauze Heel Cup Edema Control: Avoid standing for long periods of time Elevate legs to the level of the heart or above for 30 minutes daily and/or when sitting, a frequency of: - throughout the day. Off-Loading: Open toe surgical shoe to: - left foot. Other: - Patient to wear bunny boot to aid in relieving pressure from left heel and ankle. Additional Orders / Instructions: Follow Nutritious Diet - increase protein and vegetables to aid in wound healing. -Continue PolyMem silver with dressing changes as before to the wound -Return in 4 weeks Electronic Signature(s) Signed: 06/22/2019 1:28:21 PM By: Tobi Bastos MD, MBA Entered By: Tobi Bastos on 06/22/2019 13:28:20 -------------------------------------------------------------------------------- SuperBill Details Patient Name: Date of Service: [name] [name]. 06/22/2019 Medical Record Number: [id number] Patient  Account Number: [id number] Date of Birth/Sex: Treating RN: [birth year] (86 y.o. [name] Primary Care Provider: Jerlyn [name] Other Clinician: Referring Provider: Treating Provider/Extender: [name] [name] in Treatment: 10 Diagnosis Coding ICD-10 Codes Code Description [phone] Atherosclerosis of native arteries of left leg with ulceration of ankle L89.623 Pressure ulcer of left heel, stage 3 Facility Procedures The patient participates with Medicare or their insurance follows the Medicare Facility Guidelines: CPT4 Code Description Modifier Quantity [id number] [place] VISIT-LEV 3 EST PT 1 Physician Procedures : CPT4 Code Description Modifier [id number] - WC PHYS LEVEL 3 - EST PT ICD-10 Diagnosis Description I70.243 Atherosclerosis of native arteries of left leg with ulceration of ankle Quantity: 1 Electronic Signature(s) Signed: 06/22/2019 4:30:26 PM By: Tobi Bastos MD, MBA Signed: 06/22/2019 4:59:31 PM By: Kela Millin Previous Signature: 06/22/2019 1:28:35 PM Version By: Tobi Bastos MD, MBA Entered By: Kela Millin on 06/22/2019 13:36:09

## 2019-06-28 NOTE — Progress Notes (Signed)
[name] [name], [name] [name] ([id number]) Visit Report for 06/22/2019 Arrival Information Details Patient Name: Date of Service: [name] [name], [name] [name]. 06/22/2019 1:00 PM Medical Record Number: [id number] Patient Account Number: [id number] Date of Birth/Sex: Treating RN: 01/19/1918 (86 y.o. [name] [name] Primary Care Provider: Jerlyn Ly Other Clinician: Referring Provider: Treating Provider/Extender: Cherly Beach in Treatment: 10 Visit Information History Since Last Visit Added or deleted any medications: No Patient Arrived: Wheel Chair Any new allergies or adverse reactions: No Arrival Time: 13:12 Had a fall or experienced change in No Accompanied By: daughter activities of daily living that may affect Transfer Assistance: None risk of falls: Patient Identification Verified: Yes Signs or symptoms of abuse/neglect since last visito No Secondary Verification Process Completed: Yes Hospitalized since last visit: No Patient Requires Transmission-Based Precautions: No Implantable device outside of the clinic excluding No Patient Has Alerts: Yes cellular tissue based products placed in the center Patient Alerts: Patient on Blood Thinner since last visit: ABI L: 1.5 Has Dressing in Place as Prescribed: Yes TBI L: 0.36 Pain Present Now: No Electronic Signature(s) Signed: 06/28/2019 9:12:52 AM By: Sandre Kitty Entered By: Sandre Kitty on 06/22/2019 13:13:02 -------------------------------------------------------------------------------- Clinic Level of Care Assessment Details Patient Name: Date of Service: [name] [name], [name] [name]. 06/22/2019 1:00 PM Medical Record Number: [id number] Patient Account Number: [id number] Date of Birth/Sex: Treating RN: [birth year] (86 y.o. [name] [name] Primary Care Provider: Jerlyn Ly Other Clinician: Referring Provider: Treating Provider/Extender: Cherly Beach in Treatment: 10 Clinic Level of Care  Assessment Items TOOL 4 Quantity Score X- 1 0 Use when only an EandM is performed on FOLLOW-UP visit ASSESSMENTS - Nursing Assessment / Reassessment X- 1 10 Reassessment of Co-morbidities (includes updates in patient status) X- 1 5 Reassessment of Adherence to Treatment Plan ASSESSMENTS - Wound and Skin A ssessment / Reassessment []  - 0 Simple Wound Assessment / Reassessment - one wound X- 2 5 Complex Wound Assessment / Reassessment - multiple wounds []  - 0 Dermatologic / Skin Assessment (not related to wound area) ASSESSMENTS - Focused Assessment X- 1 5 Circumferential Edema Measurements - multi extremities []  - 0 Nutritional Assessment / Counseling / Intervention []  - 0 Lower Extremity Assessment (monofilament, tuning fork, pulses) []  - 0 Peripheral Arterial Disease Assessment (using hand held doppler) ASSESSMENTS - Ostomy and/or Continence Assessment and Care []  - 0 Incontinence Assessment and Management []  - 0 Ostomy Care Assessment and Management (repouching, etc.) PROCESS - Coordination of Care X - Simple Patient / Family Education for ongoing care 1 15 []  - 0 Complex (extensive) Patient / Family Education for ongoing care X- 1 10 Staff obtains Programmer, systems, Records, T Results / Process Orders est []  - 0 Staff telephones HHA, Nursing Homes / Clarify orders / etc []  - 0 Routine Transfer to another Facility (non-emergent condition) []  - 0 Routine Hospital Admission (non-emergent condition) []  - 0 New Admissions / Biomedical engineer / Ordering NPWT Apligraf, etc. , []  - 0 Emergency Hospital Admission (emergent condition) X- 1 10 Simple Discharge Coordination []  - 0 Complex (extensive) Discharge Coordination PROCESS - Special Needs []  - 0 Pediatric / Minor Patient Management []  - 0 Isolation Patient Management []  - 0 Hearing / Language / Visual special needs []  - 0 Assessment of Community assistance (transportation, D/C planning, etc.) []  -  0 Additional assistance / Altered mentation []  - 0 Support Surface(s) Assessment (bed, cushion, seat, etc.) INTERVENTIONS - Wound Cleansing / Measurement []  - 0 Simple Wound  Cleansing - one wound X- 2 5 Complex Wound Cleansing - multiple wounds X- 1 5 Wound Imaging (photographs - any number of wounds) []  - 0 Wound Tracing (instead of photographs) []  - 0 Simple Wound Measurement - one wound X- 2 5 Complex Wound Measurement - multiple wounds INTERVENTIONS - Wound Dressings X - Small Wound Dressing one or multiple wounds 1 10 []  - 0 Medium Wound Dressing one or multiple wounds []  - 0 Large Wound Dressing one or multiple wounds []  - 0 Application of Medications - topical []  - 0 Application of Medications - injection INTERVENTIONS - Miscellaneous []  - 0 External ear exam []  - 0 Specimen Collection (cultures, biopsies, blood, body fluids, etc.) []  - 0 Specimen(s) / Culture(s) sent or taken to Lab for analysis []  - 0 Patient Transfer (multiple staff / Civil Service fast streamer / Similar devices) []  - 0 Simple Staple / Suture removal (25 or less) []  - 0 Complex Staple / Suture removal (26 or more) []  - 0 Hypo / Hyperglycemic Management (close monitor of Blood Glucose) []  - 0 Ankle / Brachial Index (ABI) - do not check if billed separately X- 1 5 Vital Signs Has the patient been seen at the hospital within the last three years: Yes Total Score: 105 Level Of Care: New/Established - Level 3 Electronic Signature(s) Signed: 06/22/2019 4:59:31 PM By: Kela Millin Entered By: Kela Millin on 06/22/2019 13:35:11 -------------------------------------------------------------------------------- Encounter Discharge Information Details Patient Name: Date of Service: [name] [name]. 06/22/2019 1:00 PM Medical Record Number: [id number] Patient Account Number: [id number] Date of Birth/Sex: Treating RN: [birth year] (86 y.o. [name] [name] Primary Care Provider: Jerlyn Ly Other  Clinician: Referring Provider: Treating Provider/Extender: Cherly Beach in Treatment: 10 Encounter Discharge Information Items Discharge Condition: Stable Ambulatory Status: Wheelchair Discharge Destination: Home Transportation: Private Auto Accompanied By: [name] [name] Schedule Follow-up Appointment: Yes Clinical Summary of Care: Patient Declined Electronic Signature(s) Signed: 06/22/2019 4:59:25 PM By: Baruch Gouty RN, BSN Entered By: Baruch Gouty on 06/22/2019 13:41:33 -------------------------------------------------------------------------------- Lower Extremity Assessment Details Patient Name: Date of Service: [name] [name]. 06/22/2019 1:00 PM Medical Record Number: [id number] Patient Account Number: [id number] Date of Birth/Sex: Treating RN: [birth year] (86 y.o. [name] [name] Primary Care Provider: Jerlyn Ly Other Clinician: Referring Provider: Treating Provider/Extender: Cherly Beach in Treatment: 10 Edema Assessment Assessed: [name] [name]: No] [Right: No] Edema: [Left: Ye] [Right: s] Calf Left: Right: Point of Measurement: cm From Medial Instep 29.7 cm cm Ankle Left: Right: Point of Measurement: cm From Medial Instep 20.3 cm cm Vascular Assessment Pulses: Dorsalis Pedis Palpable: [Left:Yes] Electronic Signature(s) Signed: 06/22/2019 4:59:31 PM By: Kela Millin Entered By: Kela Millin on 06/22/2019 13:20:35 -------------------------------------------------------------------------------- Multi-Disciplinary Care Plan Details Patient Name: Date of Service: [name] [name]. 06/22/2019 1:00 PM Medical Record Number: [id number] Patient Account Number: [id number] Date of Birth/Sex: Treating RN: [birth year] (86 y.o. [name] [name] Primary Care Provider: Jerlyn Ly Other Clinician: Referring Provider: Treating Provider/Extender: Cherly Beach in Treatment: 10 Active  Inactive Abuse / Safety / Falls / Self Care Management Nursing Diagnoses: Potential for falls Goals: Patient will remain injury free related to falls Date Initiated: 04/12/2019 Target Resolution Date: 07/20/2019 Goal Status: Active Patient/caregiver will verbalize understanding of skin care regimen Date Initiated: 04/12/2019 Date Inactivated: 05/25/2019 Target Resolution Date: 05/25/2019 Goal Status: Met Interventions: Provide education on fall prevention Notes: Wound/Skin Impairment Nursing Diagnoses: Knowledge deficit related to ulceration/compromised skin integrity Goals: Ulcer/skin breakdown will  heal within 14 weeks Date Initiated: 04/12/2019 Target Resolution Date: 08/03/2019 Goal Status: Active Interventions: Assess patient/caregiver ability to obtain necessary supplies Assess patient/caregiver ability to perform ulcer/skin care regimen upon admission and as needed Provide education on ulcer and skin care Treatment Activities: Skin care regimen initiated : 04/12/2019 Topical wound management initiated : 04/12/2019 Notes: Electronic Signature(s) Signed: 06/22/2019 4:59:31 PM By: Kela Millin Entered By: Kela Millin on 06/22/2019 13:15:10 -------------------------------------------------------------------------------- Pain Assessment Details Patient Name: Date of Service: [name] [name]. 06/22/2019 1:00 PM Medical Record Number: [id number] Patient Account Number: [id number] Date of Birth/Sex: Treating RN: 1917-12-20 (86 y.o. [name] [name] Primary Care Provider: Jerlyn Ly Other Clinician: Referring Provider: Treating Provider/Extender: Cherly Beach in Treatment: 10 Active Problems Location of Pain Severity and Description of Pain Patient Has Paino No Site Locations Pain Management and Medication Current Pain Management: Electronic Signature(s) Signed: 06/22/2019 4:59:31 PM By: Kela Millin Signed: 06/28/2019 9:12:52 AM  By: Sandre Kitty Entered By: Sandre Kitty on 06/22/2019 13:13:23 -------------------------------------------------------------------------------- Patient/Caregiver Education Details Patient Name: Date of Service: Fabrizio, [name] RY M. 5/28/2021andnbsp1:00 PM Medical Record Number: [id number] Patient Account Number: [id number] Date of Birth/Gender: Treating RN: [birth year] (86 y.o. [name] [name] Primary Care Physician: Jerlyn Ly Other Clinician: Referring Physician: Treating Physician/Extender: Cherly Beach in Treatment: 10 Education Assessment Education Provided To: Patient Education Topics Provided Safety: Handouts: Medication Safety Methods: Explain/Verbal Responses: State content correctly Wound/Skin Impairment: Handouts: Caring for Your Ulcer Methods: Explain/Verbal Responses: State content correctly Electronic Signature(s) Signed: 06/22/2019 4:59:31 PM By: Kela Millin Entered By: Kela Millin on 06/22/2019 13:15:37 -------------------------------------------------------------------------------- Wound Assessment Details Patient Name: Date of Service: [name] [name]. 06/22/2019 1:00 PM Medical Record Number: [id number] Patient Account Number: [id number] Date of Birth/Sex: Treating RN: 1917-04-21 (86 y.o. [name] [name] Primary Care Provider: Jerlyn Ly Other Clinician: Referring Provider: Treating Provider/Extender: Cherly Beach in Treatment: 10 Wound Status Wound Number: 2 Primary Arterial Insufficiency Ulcer Etiology: Wound Location: Left, Lateral Malleolus Wound Open Wounding Event: Gradually Appeared Status: Date Acquired: 02/22/2019 Comorbid Cataracts, Chronic sinus problems/congestion, Anemia, Angina, Weeks Of Treatment: 10 History: Arrhythmia, Hypertension, Peripheral Arterial Disease, Peripheral Clustered Wound: No Venous Disease, Osteoarthritis, Received  Chemotherapy Photos Photo Uploaded By: Mikeal Hawthorne on 06/26/2019 09:24:39 Wound Measurements Length: (cm) 0.3 Width: (cm) 0.3 Depth: (cm) 0.1 Area: (cm) 0.071 Volume: (cm) 0.007 % Reduction in Area: 95.6% % Reduction in Volume: 95.6% Epithelialization: Small (1-33%) Tunneling: No Undermining: No Wound Description Classification: Full Thickness Without Exposed Support Structures Wound Margin: Distinct, outline attached Exudate Amount: Medium Exudate Type: Serosanguineous Exudate Color: red, brown Foul Odor After Cleansing: No Slough/Fibrino Yes Wound Bed Granulation Amount: Large (67-100%) Exposed Structure Granulation Quality: Pink Fascia Exposed: No Necrotic Amount: Small (1-33%) Fat Layer (Subcutaneous Tissue) Exposed: Yes Necrotic Quality: Adherent Slough Tendon Exposed: No Muscle Exposed: No Joint Exposed: No Bone Exposed: No Treatment Notes Wound #2 (Left, Lateral Malleolus) 3. Primary Dressing Applied Polymem Ag 4. Secondary Dressing Dry Gauze Roll Gauze Foam Border Dressing 7. Footwear/Offloading device applied Surgical shoe Electronic Signature(s) Signed: 06/22/2019 4:59:31 PM By: Kela Millin Entered By: Kela Millin on 06/22/2019 13:21:16 -------------------------------------------------------------------------------- Wound Assessment Details Patient Name: Date of Service: [name] [name]. 06/22/2019 1:00 PM Medical Record Number: [id number] Patient Account Number: [id number] Date of Birth/Sex: Treating RN: [birth year] (86 y.o. [name] [name] Primary Care Provider: Jerlyn Ly Other Clinician: Referring Provider: Treating Provider/Extender: Cherly Beach in  Treatment: 10 Wound Status Wound Number: 3 Primary Pressure Ulcer Etiology: Wound Location: Left, Lateral Calcaneus Wound Open Wounding Event: Pressure Injury Status: Date Acquired: 04/04/2019 Comorbid Cataracts, Chronic sinus  problems/congestion, Anemia, Angina, Weeks Of Treatment: 10 History: Arrhythmia, Hypertension, Peripheral Arterial Disease, Peripheral Clustered Wound: No Venous Disease, Osteoarthritis, Received Chemotherapy Photos Photo Uploaded By: Mikeal Hawthorne on 06/26/2019 09:24:40 Wound Measurements Length: (cm) 0.3 Width: (cm) 0.3 Depth: (cm) 0.2 Area: (cm) 0.071 Volume: (cm) 0.014 % Reduction in Area: 57% % Reduction in Volume: 57.6% Epithelialization: None Tunneling: No Undermining: No Wound Description Classification: Category/Stage III Wound Margin: Flat and Intact Exudate Amount: Medium Exudate Type: Serosanguineous Exudate Color: red, brown Wound Bed Granulation Amount: Large (67-100%) Granulation Quality: Pink Necrotic Amount: Small (1-33%) Necrotic Quality: Adherent Slough Foul Odor After Cleansing: No Slough/Fibrino Yes Exposed Structure Fascia Exposed: No Fat Layer (Subcutaneous Tissue) Exposed: Yes Tendon Exposed: No Muscle Exposed: No Joint Exposed: No Bone Exposed: No Treatment Notes Wound #3 (Left, Lateral Calcaneus) 3. Primary Dressing Applied Polymem Ag 4. Secondary Dressing Dry Gauze Roll Gauze Foam Border Dressing 7. Footwear/Offloading device applied Surgical shoe Electronic Signature(s) Signed: 06/22/2019 4:59:31 PM By: Kela Millin Entered By: Kela Millin on 06/22/2019 13:21:07 -------------------------------------------------------------------------------- Vitals Details Patient Name: Date of Service: [name] Setting M. 06/22/2019 1:00 PM Medical Record Number: [id number] Patient Account Number: [id number] Date of Birth/Sex: Treating RN: Apr 30, 1917 (86 y.o. [name] [name] Primary Care Provider: Jerlyn Ly Other Clinician: Referring Provider: Treating Provider/Extender: Cherly Beach in Treatment: 10 Vital Signs Time Taken: 13:13 Temperature (F): 97.7 Height (in): 67 Pulse (bpm): 61 Weight  (lbs): 124 Respiratory Rate (breaths/min): 18 Body Mass Index (BMI): 19.4 Blood Pressure (mmHg): 147/58 Reference Range: 80 - 120 mg / dl Electronic Signature(s) Signed: 06/28/2019 9:12:52 AM By: Sandre Kitty Entered By: Sandre Kitty on 06/22/2019 13:13:17

## 2019-07-13 ENCOUNTER — Other Ambulatory Visit: Payer: Self-pay

## 2019-07-13 ENCOUNTER — Encounter (HOSPITAL_BASED_OUTPATIENT_CLINIC_OR_DEPARTMENT_OTHER): Payer: Medicare Other | Attending: Internal Medicine | Admitting: Internal Medicine

## 2019-07-13 DIAGNOSIS — Z7901 Long term (current) use of anticoagulants: Secondary | ICD-10-CM | POA: Diagnosis not present

## 2019-07-13 DIAGNOSIS — I129 Hypertensive chronic kidney disease with stage 1 through stage 4 chronic kidney disease, or unspecified chronic kidney disease: Secondary | ICD-10-CM | POA: Diagnosis not present

## 2019-07-13 DIAGNOSIS — N182 Chronic kidney disease, stage 2 (mild): Secondary | ICD-10-CM | POA: Insufficient documentation

## 2019-07-13 DIAGNOSIS — Z85028 Personal history of other malignant neoplasm of stomach: Secondary | ICD-10-CM | POA: Diagnosis not present

## 2019-07-13 DIAGNOSIS — L89623 Pressure ulcer of left heel, stage 3: Secondary | ICD-10-CM | POA: Diagnosis not present

## 2019-07-13 DIAGNOSIS — I70243 Atherosclerosis of native arteries of left leg with ulceration of ankle: Secondary | ICD-10-CM | POA: Diagnosis present

## 2019-07-13 DIAGNOSIS — I495 Sick sinus syndrome: Secondary | ICD-10-CM | POA: Diagnosis not present

## 2019-07-13 DIAGNOSIS — Z09 Encounter for follow-up examination after completed treatment for conditions other than malignant neoplasm: Secondary | ICD-10-CM | POA: Insufficient documentation

## 2019-07-13 NOTE — Progress Notes (Addendum)
[name], [name] ([id number]) Visit Report for 07/13/2019 Arrival Information Details Patient Name: Date of Service: [place], [name]. 07/13/2019 1:00 PM Medical Record Number: [id number] Patient Account Number: [id number] Date of Birth/Sex: Treating RN: Nov 29, 1917 (86 y.o. [name] [name], Tammi Klippel Primary Care Charnetta Wulff: Jerlyn Ly Other Clinician: Referring Laketa Sandoz: Treating Emmajean Ratledge/Extender: Leigh Aurora in Treatment: 13 Visit Information History Since Last Visit Added or deleted any medications: No Patient Arrived: Wheel Chair Any new allergies or adverse reactions: No Arrival Time: 13:20 Had a fall or experienced change in No Accompanied By: daughter activities of daily living that may affect Transfer Assistance: None risk of falls: Patient Identification Verified: Yes Signs or symptoms of abuse/neglect since No Secondary Verification Process Completed: Yes last visito Patient Requires Transmission-Based Precautions: No Hospitalized since last visit: No Patient Has Alerts: Yes Implantable device outside of the clinic No Patient Alerts: Patient on Blood Thinner excluding ABI L: 1.5 cellular tissue based products placed in the TBI L: 0.36 center since last visit: Has Dressing in Place as Prescribed: Yes Has Footwear/Offloading in Place as Yes Prescribed: Left: Surgical [name] with Pressure Relief Insole Pain Present Now: No Electronic Signature(s) Signed: 07/13/2019 5:30:12 PM By: Deon Pilling Entered By: Deon Pilling on 07/13/2019 13:26:43 -------------------------------------------------------------------------------- Clinic Level of Care Assessment Details Patient Name: Date of Service: [place], [name]. 07/13/2019 1:00 PM Medical Record Number: [id number] Patient Account Number: [id number] Date of Birth/Sex: Treating RN: 1917-10-16 (86 y.o. [name] [name] Primary Care Evann Koelzer: Jerlyn Ly Other Clinician: Referring Keldric Poyer: Treating  Fiora Weill/Extender: Leigh Aurora in Treatment: 13 Clinic Level of Care Assessment Items TOOL 4 Quantity Score X- 1 0 Use when only an EandM is performed on FOLLOW-UP visit ASSESSMENTS - Nursing Assessment / Reassessment X- 1 10 Reassessment of Co-morbidities (includes updates in patient status) X- 1 5 Reassessment of Adherence to Treatment Plan ASSESSMENTS - Wound and Skin A ssessment / Reassessment []  - 0 Simple Wound Assessment / Reassessment - one wound X- 2 5 Complex Wound Assessment / Reassessment - multiple wounds []  - 0 Dermatologic / Skin Assessment (not related to wound area) ASSESSMENTS - Focused Assessment X- 1 5 Circumferential Edema Measurements - multi extremities []  - 0 Nutritional Assessment / Counseling / Intervention []  - 0 Lower Extremity Assessment (monofilament, tuning fork, pulses) []  - 0 Peripheral Arterial Disease Assessment (using hand held doppler) ASSESSMENTS - Ostomy and/or Continence Assessment and Care []  - 0 Incontinence Assessment and Management []  - 0 Ostomy Care Assessment and Management (repouching, etc.) PROCESS - Coordination of Care X - Simple Patient / Family Education for ongoing care 1 15 []  - 0 Complex (extensive) Patient / Family Education for ongoing care X- 1 10 Staff obtains Programmer, systems, Records, T Results / Process Orders est []  - 0 Staff telephones HHA, Nursing Homes / Clarify orders / etc []  - 0 Routine Transfer to another Facility (non-emergent condition) []  - 0 Routine Hospital Admission (non-emergent condition) []  - 0 New Admissions / Biomedical engineer / Ordering NPWT Apligraf, etc. , []  - 0 Emergency Hospital Admission (emergent condition) X- 1 10 Simple Discharge Coordination []  - 0 Complex (extensive) Discharge Coordination PROCESS - Special Needs []  - 0 Pediatric / Minor Patient Management []  - 0 Isolation Patient Management []  - 0 Hearing / Language / Visual special  needs []  - 0 Assessment of Community assistance (transportation, D/C planning, etc.) []  - 0 Additional assistance / Altered mentation []  - 0 Support Surface(s) Assessment (bed,  cushion, seat, etc.) INTERVENTIONS - Wound Cleansing / Measurement []  - 0 Simple Wound Cleansing - one wound X- 2 5 Complex Wound Cleansing - multiple wounds X- 1 5 Wound Imaging (photographs - any number of wounds) []  - 0 Wound Tracing (instead of photographs) []  - 0 Simple Wound Measurement - one wound X- 2 5 Complex Wound Measurement - multiple wounds INTERVENTIONS - Wound Dressings []  - 0 Small Wound Dressing one or multiple wounds []  - 0 Medium Wound Dressing one or multiple wounds []  - 0 Large Wound Dressing one or multiple wounds []  - 0 Application of Medications - topical []  - 0 Application of Medications - injection INTERVENTIONS - Miscellaneous []  - 0 External ear exam []  - 0 Specimen Collection (cultures, biopsies, blood, body fluids, etc.) []  - 0 Specimen(s) / Culture(s) sent or taken to Lab for analysis []  - 0 Patient Transfer (multiple staff / Civil Service fast streamer / Similar devices) []  - 0 Simple Staple / Suture removal (25 or less) []  - 0 Complex Staple / Suture removal (26 or more) []  - 0 Hypo / Hyperglycemic Management (close monitor of Blood Glucose) []  - 0 Ankle / Brachial Index (ABI) - do not check if billed separately X- 1 5 Vital Signs Has the patient been seen at the hospital within the last three years: Yes Total Score: 95 Level Of Care: New/Established - Level 3 Electronic Signature(s) Signed: 07/13/2019 5:07:15 PM By: Kela Millin Entered By: Kela Millin on 07/13/2019 14:05:48 -------------------------------------------------------------------------------- Lower Extremity Assessment Details Patient Name: Date of Service: [name] [name]. 07/13/2019 1:00 PM Medical Record Number: [id number] Patient Account Number: [id number] Date of Birth/Sex: Treating  RN: 03/11/1917 (86 y.o. [name] [name] Primary Care Sharina Petre: Jerlyn Ly Other Clinician: Referring Majesty Stehlin: Treating Garrick Midgley/Extender: Leigh Aurora in Treatment: 13 Edema Assessment Assessed: [name] [name]: Yes] Patrice Paradise: No] Edema: [Left: Ye] [Right: s] Calf Left: Right: Point of Measurement: cm From Medial Instep 29.4 cm cm Ankle Left: Right: Point of Measurement: cm From Medial Instep 19 cm cm Vascular Assessment Pulses: Dorsalis Pedis Palpable: [Left:Yes] Electronic Signature(s) Signed: 07/13/2019 5:30:12 PM By: Deon Pilling Entered By: Deon Pilling on 07/13/2019 13:27:23 -------------------------------------------------------------------------------- Multi Wound Chart Details Patient Name: Date of Service: [name] [name]. 07/13/2019 1:00 PM Medical Record Number: [id number] Patient Account Number: [id number] Date of Birth/Sex: Treating RN: 07/17/1917 (86 y.o. [name] [name] Primary Care Yoshio Seliga: Jerlyn Ly Other Clinician: Referring [place] Basham: Treating Oksana Deberry/Extender: Leigh Aurora in Treatment: 13 Vital Signs Height(in): 67 Pulse(bpm): 26 Weight(lbs): 124 Blood Pressure(mmHg): 147/77 Body Mass Index(BMI): 19 Temperature(F): 98.4 Respiratory Rate(breaths/min): 16 Photos: [2:No Photos Left, Lateral Malleolus] [3:No Photos Left, Lateral Calcaneus] [N/A:N/A N/A] Wound Location: [2:Gradually Appeared] [3:Pressure Injury] [N/A:N/A] Wounding Event: [2:Arterial Insufficiency Ulcer] [3:Pressure Ulcer] [N/A:N/A] Primary Etiology: [2:N/A] [3:Cataracts, Chronic sinus] [N/A:N/A] Comorbid History: [2:02/22/2019] [3:problems/congestion, Anemia, Angina, Arrhythmia, Hypertension, Peripheral Arterial Disease, Peripheral Venous Disease, Osteoarthritis, Received Chemotherapy 04/04/2019] [N/A:N/A] Date Acquired: [2:13] [3:13] [N/A:N/A] Weeks of Treatment: [2:Healed - Epithelialized] [3:Healed - Epithelialized] [N/A:N/A] Wound  Status: [2:0x0x0] [3:0x0x0] [N/A:N/A] Measurements L x W x D (cm) [2:0] [3:0] [N/A:N/A] A (cm) : rea [2:0] [3:0] [N/A:N/A] Volume (cm) : [2:100.00%] [3:100.00%] [N/A:N/A] % Reduction in Area: [2:100.00%] [3:100.00%] [N/A:N/A] % Reduction in Volume: [2:Full Thickness Without Exposed] [3:Category/Stage III] [N/A:N/A] Classification: [2:Support Structures N/A] [3:None Present] [N/A:N/A] Exudate A mount: [2:N/A] [3:Flat and Intact] [N/A:N/A] Wound Margin: [2:N/A] [3:None Present (0%)] [N/A:N/A] Granulation A mount: [2:N/A] [3:None Present (0%)] [N/A:N/A] Necrotic A mount: [2:N/A] [  3:Large (67-100%)] [N/A:N/A] Treatment Notes Electronic Signature(s) Signed: 07/13/2019 5:07:15 PM By: Kela Millin Signed: 07/13/2019 5:24:49 PM By: Linton Ham MD Entered By: Linton Ham on 07/13/2019 14:07:49 -------------------------------------------------------------------------------- Multi-Disciplinary Care Plan Details Patient Name: Date of Service: [place], [place] M. 07/13/2019 1:00 PM Medical Record Number: [id number] Patient Account Number: [id number] Date of Birth/Sex: Treating RN: 01-04-1918 (86 y.o. [name] [name] Primary Care Rydan Gulyas: Jerlyn Ly Other Clinician: Referring Jireh Elmore: Treating Annalina Needles/Extender: Leigh Aurora in Treatment: 13 Active Inactive Electronic Signature(s) Signed: 07/13/2019 5:07:15 PM By: Kela Millin Entered By: Kela Millin on 07/13/2019 14:04:09 -------------------------------------------------------------------------------- Pain Assessment Details Patient Name: Date of Service: [name] [name]. 07/13/2019 1:00 PM Medical Record Number: [id number] Patient Account Number: [id number] Date of Birth/Sex: Treating RN: [birth year] (86 y.o. [name] [name] Primary Care Maron Stanzione: Jerlyn Ly Other Clinician: Referring Tayelor Osborne: Treating Shayli Altemose/Extender: Leigh Aurora in Treatment:  13 Active Problems Location of Pain Severity and Description of Pain Patient Has Paino No Site Locations Rate the pain. Current Pain Level: 0 Pain Management and Medication Current Pain Management: Medication: No Cold Application: No Rest: No Massage: No Activity: No T.E.N.S.: No Heat Application: No Leg drop or elevation: No Is the Current Pain Management Adequate: Inadequate How does your wound impact your activities of daily livingo Sleep: No Bathing: No Appetite: No Relationship With Others: No Bladder Continence: No Emotions: No Bowel Continence: No Work: No Toileting: No Drive: No Dressing: No Hobbies: No Electronic Signature(s) Signed: 07/13/2019 5:30:12 PM By: Deon Pilling Entered By: Deon Pilling on 07/13/2019 13:27:05 -------------------------------------------------------------------------------- Patient/Caregiver Education Details Patient Name: Date of Service: [place], [name] RY M. 6/18/2021andnbsp1:00 PM Medical Record Number: [id number] Patient Account Number: [id number] Date of Birth/Gender: Treating RN: [birth year] (86 y.o. [name] [name] Primary Care Physician: Jerlyn Ly Other Clinician: Referring Physician: Treating Physician/Extender: Leigh Aurora in Treatment: 13 Education Assessment Education Provided To: Patient Education Topics Provided Safety: Handouts: Safe Transfers Methods: Explain/Verbal Responses: State content correctly Wound/Skin Impairment: Handouts: Caring for Your Ulcer Methods: Explain/Verbal Responses: State content correctly Electronic Signature(s) Signed: 07/13/2019 5:07:15 PM By: Kela Millin Entered By: Kela Millin on 07/13/2019 13:02:23 -------------------------------------------------------------------------------- Wound Assessment Details Patient Name: Date of Service: [name] [name]. 07/13/2019 1:00 PM Medical Record Number: [id number] Patient Account Number:  [id number] Date of Birth/Sex: Treating RN: 24-Nov-1917 (86 y.o. 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, Lateral Malleolus Wound Healed - Epithelialized Wounding Event: Gradually Appeared Status: Date Acquired: 02/22/2019 Comorbid Cataracts, Chronic sinus problems/congestion, Anemia, Angina, Weeks Of Treatment: 13 History: Arrhythmia, Hypertension, Peripheral Arterial Disease, Peripheral Clustered Wound: No Venous Disease, Osteoarthritis, Received Chemotherapy Photos Wound Measurements Length: (cm) Width: (cm) Depth: (cm) Area: (cm) Volume: (cm) 0 % Reduction in Area: 100% 0 % Reduction in Volume: 100% 0 Epithelialization: Small (1-33%) 0 0 Wound Description Classification: Full Thickness Without Exposed Support Structures Wound Margin: Distinct, outline attached Exudate Amount: Medium Exudate Type: Serosanguineous Exudate Color: red, brown Foul Odor After Cleansing: No Slough/Fibrino Yes Wound Bed Granulation Amount: Large (67-100%) Exposed Structure Granulation Quality: Pink Fascia Exposed: No Necrotic Amount: Small (1-33%) Fat Layer (Subcutaneous Tissue) Exposed: Yes Necrotic Quality: Adherent Slough Tendon Exposed: No Muscle Exposed: No Joint Exposed: No Bone Exposed: No Electronic Signature(s) Signed: 07/18/2019 4:53:33 PM By: Deon Pilling Signed: 07/18/2019 5:20:20  PM By: Minerva Fester Previous Signature: 07/13/2019 5:30:12 PM Version By: Deon Pilling Entered By: Minerva Fester on 07/18/2019 15:53:04 -------------------------------------------------------------------------------- Wound Assessment Details Patient Name: Date of Service: [name] [name]. 07/13/2019 1:00 PM Medical Record Number: [id number] Patient Account Number:  [id number] Date of Birth/Sex: Treating RN: 11/09/1917 (86 y.o. [name] [name] Primary Care Ricki Clack: Jerlyn Ly Other Clinician: Referring Kosha Jaquith: Treating Jull Harral/Extender: Leigh Aurora in Treatment: 13 Wound Status Wound Number: 3 Primary Pressure Ulcer Etiology: Wound Location: Left, Lateral Calcaneus Wound Healed - Epithelialized Wounding Event: Pressure Injury Status: Date Acquired: 04/04/2019 Comorbid Cataracts, Chronic sinus problems/congestion, Anemia, Angina, Weeks Of Treatment: 13 History: Arrhythmia, Hypertension, Peripheral Arterial Disease, Peripheral Clustered Wound: No Venous Disease, Osteoarthritis, Received Chemotherapy Photos Wound Measurements Length: (cm) Width: (cm) Depth: (cm) Area: (cm) Volume: (cm) 0 % Reduction in Area: 100% 0 % Reduction in Volume: 100% 0 Epithelialization: Large (67-100%) 0 Tunneling: No 0 Undermining: No Wound Description Classification: Category/Stage III Wound Margin: Flat and Intact Exudate Amount: None Present Foul Odor After Cleansing: No Slough/Fibrino Yes Wound Bed Granulation Amount: None Present (0%) Exposed Structure Necrotic Amount: None Present (0%) Fascia Exposed: No Fat Layer (Subcutaneous Tissue) Exposed: Yes Tendon Exposed: No Muscle Exposed: No Joint Exposed: No Bone Exposed: No Electronic Signature(s) Signed: 07/18/2019 5:20:20 PM By: Minerva Fester Signed: 07/19/2019 5:29:15 PM By: Kela Millin Previous Signature: 07/13/2019 5:07:15 PM Version By: Kela Millin Entered By: Minerva Fester on 07/18/2019 15:53:31 -------------------------------------------------------------------------------- Vitals Details Patient Name: Date of Service: [name] M. 07/13/2019 1:00 PM Medical Record Number: [id number] Patient Account Number: [id number] Date of Birth/Sex: Treating RN: 1917/08/09 (86 y.o. [name] [name], Meta.Reding Primary Care Pink Maye: Jerlyn Ly Other  Clinician: Referring Hazelle Woollard: Treating Staphany Ditton/Extender: Leigh Aurora in Treatment: 13 Vital Signs Time Taken: 13:20 Temperature (F): 98.4 Height (in): 67 Pulse (bpm): 62 Weight (lbs): 124 Respiratory Rate (breaths/min): 16 Body Mass Index (BMI): 19.4 Blood Pressure (mmHg): 147/77 Reference Range: 80 - 120 mg / dl Electronic Signature(s) Signed: 07/13/2019 5:30:12 PM By: Deon Pilling Entered By: Deon Pilling on 07/13/2019 13:26:55

## 2019-07-13 NOTE — Progress Notes (Signed)
[name] [name], [name] [name] ([id number]) Visit Report for 07/13/2019 HPI Details Patient Name: Date of Service: [place], [name]. 07/13/2019 1:00 PM Medical Record Number: [id number] Patient Account Number: [id number] Date of Birth/Sex: Treating RN: 1917-08-07 (86 y.o. [name] [name] Primary Care Provider: Jerlyn [name] Other Clinician: Referring Provider: Treating Provider/Extender: [name] [name] in Treatment: 13 History of Present Illness Location: Patient presents with an ulcer on the left lateral ankle. Quality: Patient reports No Pain. Severity: Patient states wound(s) are getting worse. Duration: Patient has had the wound for > 6 months prior to seeking treatment at the wound center Timing: Pain in wound is Intermittent (comes and goes Context: The wound would happen gradually Modifying Factors: the ulcer has been persistent after treatment for at least 3 months and at the present time collagen and has been ssociated Signs and Symptoms: Patient reports presence of swelling A HPI Description: Pleasant 86 year old patient who has no significant medical problems except mild high blood pressure, cardiac irregularity and generalized aches and pains. She presented to the podiatrist Dr. Melony [name] for a left lateral ankle ulceration and has been treated for about 3 months without resolution. The patient was referred to [place] for further care. The patient has not had any vascular study done except for a DVT study done about 2 years ago which was negative for DVT of the lower extremities. 09/11/2014 -- the patient had her vascular study done today but the reports are not ready yet. 09/18/2014 -- the ABI was not measured due to having noncompressible vessels due to medial calcification. However the bilateral tibial artery Doppler waveform in the TBI was normal and no further tests were recommended. 10/30/2014 -- she has developed a superficial abrasion just medial and superior to her  previous wound. 11/13/2014 -- she has been authorized to get the tri-layer Oasis. She has done very well with her [place] last week. 12/18/2014 -- her wound has done very well but stalled a bit due to possible superimposed tape burn. After cleaning the wound I believe it's time to use her tri- layer Oasis. 12/25/2014 -- She has had her Second application of Oasis. 01/01/2015 -- her wound looks excellent today and we will not use the next application of Oasis. 01/15/2015 -- she is here for review after 2 weeks due to scheduling issues. she has been doing the dressing as advised READMISSION 04/12/2019 Patient is now 86 year old woman. She was here in the wound care center in 2016 under the care of Dr. Con [name] with a left lateral ankle ulcer. Eventually this healed over however an area in the same location opened up at the end of January. She is known to have PAD and follow-up arterial studies were ordered on 3/12. This showed noncompressible ABIs bilaterally at 1.51. Great toe pressures were only 61 on the right and 65 on the left resulting in TBI's of 0.36 and 0.39 respectively. The patient apparently declined any invasive procedures including angiography. About a week ago she had another wound open up on the lateral part of her heel just above the weightbearing plantar part of the heel. It is felt that this was probably a pressure ulcer. The patient is not a diabetic or smoker. She is not complaining of a lot of pain. She does complain of the heel more than her lateral malleolus. Past medical history includes sick sinus syndrome on Eliquis, hypothyroidism, macular degeneration, hypertension, hyperlipidemia, stage II chronic renal failure apparently some history of malignancy involving her stomach  3/25; patient with known PAD who does not want any further consideration of interventions. She has areas on her left lateral malleolus and left lateral heel. Using silver collagen. She did  not tolerate bunny boots to offload the heel so they have been using a long pillow. Her daughter is changing the dressings 4/9; patient with known PAD who does not want to pursue any further investigations. She is complaining of minimal pain. We have been using silver collagen to to wound areas 1 on the left lateral malleolus and the other on the left lateral heel just above the plantar surface. Her daughter is religiously offloading these areas especially can 4/30; patient with known PAD does not wish to pursue any further investigations. And she complains of minimal amounts of pain. She has an area on the left lateral malleolus and the left lateral heel. We have been using silver collagen. Change this to polymen Ag this week 06/22/19-Patient here for 1 month checkup, both wounds on the left leg are stable and doing well we are using PolyMem silver 6/18; 1 month follow-up. Both the areas on the left lateral malleolus and the left lateral calcaneus are healed. Electronic Signature(s) Signed: 07/13/2019 5:24:49 PM By: Linton Ham MD Entered By: Linton Ham on 07/13/2019 14:08:14 -------------------------------------------------------------------------------- Physical Exam Details Patient Name: Date of Service: [name] [name]. 07/13/2019 1:00 PM Medical Record Number: [id number] Patient Account Number: [id number] Date of Birth/Sex: Treating RN: September 30, 1917 (86 y.o. [name] [name] Primary Care Provider: Jerlyn [name] Other Clinician: Referring Provider: Treating Provider/Extender: [name] [name] in Treatment: 54 Constitutional Patient is hypertensive.. Pulse regular and within target range for patient.Marland Kitchen Respirations regular, non-labored and within target range.. Temperature is normal and within the target range for the patient.Marland Kitchen Appears in no distress. Notes Wound exam; left lateral malleolus wound and left heel wound are both closed over. Some eschar on the  heel although I did not think this was worth removing. The malleolus is clearly healed Electronic Signature(s) Signed: 07/13/2019 5:24:49 PM By: Linton Ham MD Entered By: Linton Ham on 07/13/2019 14:08:54 -------------------------------------------------------------------------------- Physician Orders Details Patient Name: Date of Service: [name] [name]. 07/13/2019 1:00 PM Medical Record Number: [id number] Patient Account Number: [id number] Date of Birth/Sex: Treating RN: 1917/11/29 (86 y.o. [name] [name] Primary Care Provider: Jerlyn [name] Other Clinician: Referring Provider: Treating Provider/Extender: [name] [name] in Treatment: 13 Verbal / Phone Orders: No Diagnosis Coding ICD-10 Coding Code Description [phone] Atherosclerosis of native arteries of left leg with ulceration of ankle L89.623 Pressure ulcer of left heel, stage 3 Discharge From [facility] Discharge from [place] - call if wound re-opens or new site develops Electronic Signature(s) Signed: 07/13/2019 5:07:15 PM By: Kela Millin Signed: 07/13/2019 5:24:49 PM By: Linton Ham MD Entered By: Kela Millin on 07/13/2019 14:04:47 -------------------------------------------------------------------------------- Problem List Details Patient Name: Date of Service: [name] [name]. 07/13/2019 1:00 PM Medical Record Number: [id number] Patient Account Number: [id number] Date of Birth/Sex: Treating RN: [birth year] (86 y.o. [name] [name] Primary Care Provider: Jerlyn [name] Other Clinician: Referring Provider: Treating Provider/Extender: [name] [name] in Treatment: 13 Active Problems ICD-10 Encounter Code Description Active Date MDM Diagnosis I70.243 Atherosclerosis of native arteries of left leg with ulceration of ankle 04/12/2019 No Yes L89.623 Pressure ulcer of left heel, stage 3 04/12/2019 No Yes Inactive Problems Resolved  Problems Electronic Signature(s) Signed: 07/13/2019 5:24:49 PM By: Linton Ham MD Entered By: Linton Ham  on 07/13/2019 14:07:34 -------------------------------------------------------------------------------- Progress Note Details Patient Name: Date of Service: [place], [name]. 07/13/2019 1:00 PM Medical Record Number: [id number] Patient Account Number: [id number] Date of Birth/Sex: Treating RN: April 25, 1917 (86 y.o. [name] [name] Primary Care Provider: Jerlyn [name] Other Clinician: Referring Provider: Treating Provider/Extender: [name] [name] in Treatment: 13 Subjective History of Present Illness (HPI) The following HPI elements were documented for the patient's wound: Location: Patient presents with an ulcer on the left lateral ankle. Quality: Patient reports No Pain. Severity: Patient states wound(s) are getting worse. Duration: Patient has had the wound for > 6 months prior to seeking treatment at the wound center Timing: Pain in wound is Intermittent (comes and goes Context: The wound would happen gradually Modifying Factors: the ulcer has been persistent after treatment for at least 3 months and at the present time collagen and has been Associated Signs and Symptoms: Patient reports presence of swelling Pleasant 86 year old patient who has no significant medical problems except mild high blood pressure, cardiac irregularity and generalized aches and pains. She presented to the podiatrist Dr. Melony [name] for a left lateral ankle ulceration and has been treated for about 3 months without resolution. The patient was referred to [place] for further care. The patient has not had any vascular study done except for a DVT study done about 2 years ago which was negative for DVT of the lower extremities. 09/11/2014 -- the patient had her vascular study done today but the reports are not ready yet. 09/18/2014 -- the ABI was not measured due to having noncompressible  vessels due to medial calcification. However the bilateral tibial artery Doppler waveform in the TBI was normal and no further tests were recommended. 10/30/2014 -- she has developed a superficial abrasion just medial and superior to her previous wound. 11/13/2014 -- she has been authorized to get the tri-layer Oasis. She has done very well with her [place] last week. 12/18/2014 -- her wound has done very well but stalled a bit due to possible superimposed tape burn. After cleaning the wound I believe it's time to use her tri- layer Oasis. 12/25/2014 -- She has had her Second application of Oasis. 01/01/2015 -- her wound looks excellent today and we will not use the next application of Oasis. 01/15/2015 -- she is here for review after 2 weeks due to scheduling issues. she has been doing the dressing as advised READMISSION 04/12/2019 Patient is now 86 year old woman. She was here in the wound care center in 2016 under the care of Dr. Con [name] with a left lateral ankle ulcer. Eventually this healed over however an area in the same location opened up at the end of January. She is known to have PAD and follow-up arterial studies were ordered on 3/12. This showed noncompressible ABIs bilaterally at 1.51. Great toe pressures were only 61 on the right and 65 on the left resulting in TBI's of 0.36 and 0.39 respectively. The patient apparently declined any invasive procedures including angiography. About a week ago she had another wound open up on the lateral part of her heel just above the weightbearing plantar part of the heel. It is felt that this was probably a pressure ulcer. The patient is not a diabetic or smoker. She is not complaining of a lot of pain. She does complain of the heel more than her lateral malleolus. Past medical history includes sick sinus syndrome on Eliquis, hypothyroidism, macular degeneration, hypertension, hyperlipidemia, stage II chronic renal failure apparently  some history of malignancy involving her stomach 3/25; patient with known PAD who does not want any further consideration of interventions. She has areas on her left lateral malleolus and left lateral heel. Using silver collagen. She did not tolerate bunny boots to offload the heel so they have been using a long pillow. Her daughter is changing the dressings 4/9; patient with known PAD who does not want to pursue any further investigations. She is complaining of minimal pain. We have been using silver collagen to to wound areas 1 on the left lateral malleolus and the other on the left lateral heel just above the plantar surface. Her daughter is religiously offloading these areas especially can 4/30; patient with known PAD does not wish to pursue any further investigations. And she complains of minimal amounts of pain. She has an area on the left lateral malleolus and the left lateral heel. We have been using silver collagen. Change this to polymen Ag this week 06/22/19-Patient here for 1 month checkup, both wounds on the left leg are stable and doing well we are using PolyMem silver 6/18; 1 month follow-up. Both the areas on the left lateral malleolus and the left lateral calcaneus are healed. Objective Constitutional Patient is hypertensive.. Pulse regular and within target range for patient.Marland Kitchen Respirations regular, non-labored and within target range.. Temperature is normal and within the target range for the patient.Marland Kitchen Appears in no distress. Vitals Time Taken: 1:20 PM, Height: 67 in, Weight: 124 lbs, BMI: 19.4, Temperature: 98.4 F, Pulse: 62 bpm, Respiratory Rate: 16 breaths/min, Blood Pressure: 147/77 mmHg. General Notes: Wound exam; left lateral malleolus wound and left heel wound are both closed over. Some eschar on the heel although I did not think this was worth removing. The malleolus is clearly healed Integumentary (Hair, Skin) Wound #2 status is Healed - Epithelialized. Original cause  of wound was Gradually Appeared. The wound is located on the Left,Lateral Malleolus. The wound measures 0cm length x 0cm width x 0cm depth; 0cm^2 area and 0cm^3 volume. Wound #3 status is Healed - Epithelialized. Original cause of wound was Pressure Injury. The wound is located on the Left,Lateral Calcaneus. The wound measures 0cm length x 0cm width x 0cm depth; 0cm^2 area and 0cm^3 volume. There is Fat Layer (Subcutaneous Tissue) Exposed exposed. There is no tunneling or undermining noted. There is a none present amount of drainage noted. The wound margin is flat and intact. There is no granulation within the wound bed. There is no necrotic tissue within the wound bed. Assessment Active Problems ICD-10 Atherosclerosis of native arteries of left leg with ulceration of ankle Pressure ulcer of left heel, stage 3 Plan Discharge From [facility] Services: Discharge from [place] - call if wound re-opens or new site develops 1. The patient can be discharged from the wound care center 2. Caution to try and continue to offload this area as much as they can either in bed, lift chair or with foot wear Electronic Signature(s) Signed: 07/13/2019 5:24:49 PM By: Linton Ham MD Entered By: Linton Ham on 07/13/2019 14:09:32 -------------------------------------------------------------------------------- SuperBill Details Patient Name: Date of Service: [name] [name]. 07/13/2019 Medical Record Number: [id number] Patient Account Number: [id number] Date of Birth/Sex: Treating RN: 11-03-1917 (86 y.o. [name] [name] Primary Care Provider: Jerlyn [name] Other Clinician: Referring Provider: Treating Provider/Extender: [name] [name] in Treatment: 13 Diagnosis Coding ICD-10 Codes Code Description [phone] Atherosclerosis of native arteries of left leg with ulceration of ankle L89.623 Pressure ulcer of  left heel, stage 3 Facility Procedures The patient participates  with Medicare or their insurance follows the Medicare Facility Guidelines: CPT4 Code Description Modifier Quantity [id number] [place] VISIT-LEV 3 EST PT 1 Physician Procedures : CPT4 Code Description Modifier [id number] - WC PHYS LEVEL 2 - EST PT ICD-10 Diagnosis Description I70.243 Atherosclerosis of native arteries of left leg with ulceration of ankle L89.623 Pressure ulcer of left heel, stage 3 Quantity: 1 Electronic Signature(s) Signed: 07/13/2019 5:07:15 PM By: Kela Millin Signed: 07/13/2019 5:24:49 PM By: Linton Ham MD Entered By: Kela Millin on 07/13/2019 14:37:54

## 2019-07-23 ENCOUNTER — Ambulatory Visit (INDEPENDENT_AMBULATORY_CARE_PROVIDER_SITE_OTHER): Payer: Medicare Other | Admitting: *Deleted

## 2019-07-23 DIAGNOSIS — I495 Sick sinus syndrome: Secondary | ICD-10-CM

## 2019-07-24 LAB — CUP PACEART REMOTE DEVICE CHECK
Battery Remaining Longevity: 115 mo
Battery Remaining Percentage: 95.5 %
Battery Voltage: 2.99 V
Brady Statistic AP VP Percent: 1 %
Brady Statistic AP VS Percent: 62 %
Brady Statistic AS VP Percent: 1 %
Brady Statistic AS VS Percent: 38 %
Brady Statistic RA Percent Paced: 61 %
Brady Statistic RV Percent Paced: 1 %
Date Time Interrogation Session: 20210628020035
Implantable Lead Implant Date: 20160421
Implantable Lead Implant Date: 20160421
Implantable Lead Location: 753859
Implantable Lead Location: 753860
Implantable Pulse Generator Implant Date: 20160421
Lead Channel Impedance Value: 340 Ohm
Lead Channel Impedance Value: 530 Ohm
Lead Channel Pacing Threshold Amplitude: 0.5 V
Lead Channel Pacing Threshold Amplitude: 1.25 V
Lead Channel Pacing Threshold Pulse Width: 0.5 ms
Lead Channel Pacing Threshold Pulse Width: 0.5 ms
Lead Channel Sensing Intrinsic Amplitude: 0.8 mV
Lead Channel Sensing Intrinsic Amplitude: 12 mV
Lead Channel Setting Pacing Amplitude: 2 V
Lead Channel Setting Pacing Amplitude: 2.5 V
Lead Channel Setting Pacing Pulse Width: 0.5 ms
Lead Channel Setting Sensing Sensitivity: 2 mV
Pulse Gen Model: 2240
Pulse Gen Serial Number: 7761558

## 2019-07-25 NOTE — Progress Notes (Signed)
Remote pacemaker transmission.   

## 2019-07-31 ENCOUNTER — Other Ambulatory Visit (HOSPITAL_COMMUNITY): Payer: Self-pay | Admitting: Interventional Radiology

## 2019-07-31 ENCOUNTER — Other Ambulatory Visit: Payer: Self-pay

## 2019-07-31 ENCOUNTER — Ambulatory Visit (HOSPITAL_COMMUNITY)
Admission: RE | Admit: 2019-07-31 | Discharge: 2019-07-31 | Disposition: A | Discharge status: Home / Self Care | Payer: Medicare Other | Source: Ambulatory Visit | Attending: Interventional Radiology | Admitting: Interventional Radiology

## 2019-07-31 DIAGNOSIS — Z4803 Encounter for change or removal of drains: Secondary | ICD-10-CM | POA: Insufficient documentation

## 2019-07-31 DIAGNOSIS — K831 Obstruction of bile duct: Secondary | ICD-10-CM

## 2019-07-31 DIAGNOSIS — Z4659 Encounter for fitting and adjustment of other gastrointestinal appliance and device: Secondary | ICD-10-CM | POA: Diagnosis not present

## 2019-07-31 HISTORY — PX: IR EXCHANGE BILIARY DRAIN: IMG6046

## 2019-07-31 MED ORDER — IOHEXOL 300 MG/ML  SOLN
50.0000 mL | Freq: Once | INTRAMUSCULAR | Status: AC | PRN
  Administered 2019-07-31: 10 mL

## 2019-08-01 DIAGNOSIS — H5203 Hypermetropia, bilateral: Secondary | ICD-10-CM | POA: Diagnosis not present

## 2019-08-01 DIAGNOSIS — D3131 Benign neoplasm of right choroid: Secondary | ICD-10-CM | POA: Diagnosis not present

## 2019-08-01 DIAGNOSIS — H353130 Nonexudative age-related macular degeneration, bilateral, stage unspecified: Secondary | ICD-10-CM | POA: Diagnosis not present

## 2019-08-01 DIAGNOSIS — H43393 Other vitreous opacities, bilateral: Secondary | ICD-10-CM | POA: Diagnosis not present

## 2019-08-01 DIAGNOSIS — Z961 Presence of intraocular lens: Secondary | ICD-10-CM | POA: Diagnosis not present

## 2019-08-01 DIAGNOSIS — H524 Presbyopia: Secondary | ICD-10-CM | POA: Diagnosis not present

## 2019-08-01 DIAGNOSIS — H52223 Regular astigmatism, bilateral: Secondary | ICD-10-CM | POA: Diagnosis not present

## 2019-09-26 ENCOUNTER — Other Ambulatory Visit: Payer: Self-pay | Admitting: Internal Medicine

## 2019-10-13 ENCOUNTER — Other Ambulatory Visit: Payer: Self-pay | Admitting: Internal Medicine

## 2019-10-22 ENCOUNTER — Ambulatory Visit (INDEPENDENT_AMBULATORY_CARE_PROVIDER_SITE_OTHER): Payer: Medicare Other | Admitting: Emergency Medicine

## 2019-10-22 DIAGNOSIS — I495 Sick sinus syndrome: Secondary | ICD-10-CM

## 2019-10-23 ENCOUNTER — Other Ambulatory Visit: Payer: Self-pay

## 2019-10-23 ENCOUNTER — Ambulatory Visit (HOSPITAL_COMMUNITY)
Admission: RE | Admit: 2019-10-23 | Discharge: 2019-10-23 | Disposition: A | Discharge status: Home / Self Care | Payer: Medicare Other | Source: Ambulatory Visit | Attending: Interventional Radiology | Admitting: Interventional Radiology

## 2019-10-23 ENCOUNTER — Other Ambulatory Visit (HOSPITAL_COMMUNITY): Payer: Self-pay | Admitting: Interventional Radiology

## 2019-10-23 DIAGNOSIS — K831 Obstruction of bile duct: Secondary | ICD-10-CM

## 2019-10-23 DIAGNOSIS — Z434 Encounter for attention to other artificial openings of digestive tract: Secondary | ICD-10-CM | POA: Diagnosis not present

## 2019-10-23 DIAGNOSIS — Z4803 Encounter for change or removal of drains: Secondary | ICD-10-CM | POA: Insufficient documentation

## 2019-10-23 HISTORY — PX: IR EXCHANGE BILIARY DRAIN: IMG6046

## 2019-10-23 MED ORDER — IOHEXOL 300 MG/ML  SOLN
50.0000 mL | Freq: Once | INTRAMUSCULAR | Status: AC | PRN
  Administered 2019-10-23: 10 mL

## 2019-10-24 LAB — CUP PACEART REMOTE DEVICE CHECK
Battery Remaining Longevity: 115 mo
Battery Remaining Percentage: 95.5 %
Battery Voltage: 2.99 V
Brady Statistic AP VP Percent: 1 %
Brady Statistic AP VS Percent: 61 %
Brady Statistic AS VP Percent: 1 %
Brady Statistic AS VS Percent: 39 %
Brady Statistic RA Percent Paced: 60 %
Brady Statistic RV Percent Paced: 1 %
Date Time Interrogation Session: 20210928115241
Implantable Lead Implant Date: 20160421
Implantable Lead Implant Date: 20160421
Implantable Lead Location: 753859
Implantable Lead Location: 753860
Implantable Pulse Generator Implant Date: 20160421
Lead Channel Impedance Value: 340 Ohm
Lead Channel Impedance Value: 530 Ohm
Lead Channel Pacing Threshold Amplitude: 0.5 V
Lead Channel Pacing Threshold Amplitude: 1.25 V
Lead Channel Pacing Threshold Pulse Width: 0.5 ms
Lead Channel Pacing Threshold Pulse Width: 0.5 ms
Lead Channel Sensing Intrinsic Amplitude: 1 mV
Lead Channel Sensing Intrinsic Amplitude: 12 mV
Lead Channel Setting Pacing Amplitude: 2 V
Lead Channel Setting Pacing Amplitude: 2.5 V
Lead Channel Setting Pacing Pulse Width: 0.5 ms
Lead Channel Setting Sensing Sensitivity: 2 mV
Pulse Gen Model: 2240
Pulse Gen Serial Number: 7761558

## 2019-10-24 NOTE — Progress Notes (Signed)
Remote pacemaker transmission.   

## 2019-10-27 DIAGNOSIS — Z23 Encounter for immunization: Secondary | ICD-10-CM | POA: Diagnosis not present

## 2019-12-25 DIAGNOSIS — I4891 Unspecified atrial fibrillation: Secondary | ICD-10-CM | POA: Diagnosis not present

## 2019-12-25 DIAGNOSIS — I87311 Chronic venous hypertension (idiopathic) with ulcer of right lower extremity: Secondary | ICD-10-CM | POA: Diagnosis not present

## 2019-12-25 DIAGNOSIS — L97919 Non-pressure chronic ulcer of unspecified part of right lower leg with unspecified severity: Secondary | ICD-10-CM | POA: Diagnosis not present

## 2019-12-27 ENCOUNTER — Encounter: Payer: Self-pay | Admitting: Orthopedic Surgery

## 2019-12-27 ENCOUNTER — Ambulatory Visit (INDEPENDENT_AMBULATORY_CARE_PROVIDER_SITE_OTHER): Payer: Medicare Other | Admitting: Orthopedic Surgery

## 2019-12-27 DIAGNOSIS — L97211 Non-pressure chronic ulcer of right calf limited to breakdown of skin: Secondary | ICD-10-CM

## 2019-12-27 DIAGNOSIS — I70233 Atherosclerosis of native arteries of right leg with ulceration of ankle: Secondary | ICD-10-CM | POA: Diagnosis not present

## 2019-12-27 NOTE — Progress Notes (Signed)
Office Visit Note   Patient: [name] [name]           Date of Birth: [birth year]           MRN: [id number] Visit Date: 12/27/2019              Requested by: Crist Infante, MD [address] [place],  [place] [address] PCP: Crist Infante, MD  Chief Complaint  Patient presents with  . Right Leg - Wound Check      HPI: Patient is 86 year old woman who was seen for a necrotic ulcer posterior aspect of the right calf.  Patient feels that this may be secondary to a burn from a heating pad.  Patient is currently on doxycycline.  Assessment & Plan: Visit Diagnoses:  1. Skin ulcer of right calf, limited to breakdown of skin ([place])     Plan: We will start patient on a knee-high medical compression stocking size medium.  She will wear this around-the-clock and wash her leg with soap and water daily.  Patient is given a prescription for the compression stockings.  A stocking was applied in the office.  Follow-Up Instructions: Return in about 1 week (around 01/03/2020).   Ortho Exam  Patient is alert, oriented, no adenopathy, well-dressed, normal affect, normal respiratory effort. Examination patient does not have a palpable dorsalis pedis pulse but does have a palpable posterior tibial pulse the Doppler was used and she has a monophasic posterior tibial pulse without a dorsalis pedis pulse.  Patient underwent ankle-brachial indices 6 months ago and had noncompressible flow in both the dorsalis pedis and posterior tibial arteries.  The posterior calf ulcer is 5 cm in diameter with a necrotic eschar and surrounding cellulitis the muscle exposed is viable no exposed bone or tendon no purulent drainage.  Patient's calf is 36 cm in circumference.  Imaging: No results found. No images are attached to the encounter.  Labs: Lab Results  Component Value Date   REPTSTATUS 06/08/2016 FINAL 06/03/2016   CULT  06/03/2016    NO GROWTH 5 DAYS Performed at [facility], [place] [address],  [place], [place] [address]    LABORGA ESCHERICHIA COLI 06/01/2016     Lab Results  Component Value Date   ALBUMIN 3.2 (L) 07/26/2016   ALBUMIN 1.9 (L) 06/06/2016   ALBUMIN 2.1 (L) 06/05/2016    Lab Results  Component Value Date   MG 1.7 06/05/2016   No results found for: VD25OH  No results found for: PREALBUMIN CBC EXTENDED Latest Ref Rng & Units 06/07/2016 06/06/2016 06/05/2016  WBC 4.0 - 10.5 K/uL 12.9(H) 14.3(H) 20.3(H)  RBC 3.87 - 5.11 MIL/uL 2.89(L) 2.96(L) 3.00(L)  HGB 12.0 - 15.0 g/dL 9.4(L) 9.6(L) 9.8(L)  HCT 36 - 46 % 27.2(L) 27.7(L) 27.6(L)  PLT 150 - 400 K/uL 181 154 144(L)  NEUTROABS 1.7 - 7.7 K/uL 9.9(H) 11.3(H) 17.5(H)  LYMPHSABS 0.7 - 4.0 K/uL 1.5 1.5 1.2     There is no height or weight on file to calculate BMI.  Orders:  No orders of the defined types were placed in this encounter.  No orders of the defined types were placed in this encounter.    Procedures: No procedures performed  Clinical Data: No additional findings.  ROS:  All other systems negative, except as noted in the HPI. Review of Systems  Objective: Vital Signs: There were no vitals taken for this visit.  Specialty Comments:  No specialty comments available.  PMFS History: Patient Active Problem List  Diagnosis Date Noted  . Abnormal CT of the abdomen   . E coli bacteremia 06/04/2016  . Biliary sepsis 06/04/2016  . Fecal impaction ([place]) 06/04/2016  . Pacemaker   . Nausea & vomiting 05/31/2016  . Biliary obstruction 05/31/2016  . Normocytic anemia 05/31/2016  . Pneumothorax, left 05/17/2014  . Tachy-brady syndrome ([place]) 05/16/2014  . Bradycardia 05/16/2014  . Foot ulcer ([place]) 05/16/2014  . Essential hypertension 05/16/2014  . Atrial flutter ([place]) 05/16/2014  . Sinus node dysfunction - s/p St. Jude (serial number [id number]) PPM   . Atrial fibrillation ([place]) 05/15/2014   Past Medical History:  Diagnosis Date  . Allergic rhinitis   . Anemia   . Atrial fibrillation ([place])      a. on Eliquis  . Biliary obstruction   . Chronic kidney disease (CKD), stage II (mild)   . DDD (degenerative disc disease), lumbar   . Diabetes ([place])   . DJD (degenerative joint disease)   . GERD (gastroesophageal reflux disease)   . Hyperlipidemia   . Hypertension   . Macular degeneration   . Osteoporosis   . Stomach cancer ([place])    1992  . Tachy-brady syndrome ([facility])    a. s/p STJ dual chamber PPM implant  . Vitamin B 12 deficiency   . Vitamin D deficiency   . Whooping cough     Family History  Problem Relation Age of Onset  . Heart disease Father        Dropsy  . Pneumonia Mother   . Sudden death Daughter 46  . Heart attack Son 51  . Colon cancer Neg Hx   . Esophageal cancer Neg Hx   . Pancreatic cancer Neg Hx   . Stomach cancer Neg Hx     Past Surgical History:  Procedure Laterality Date  . BACK SURGERY    . CHOLECYSTECTOMY    . IR EXCHANGE BILIARY DRAIN  06/22/2016  . IR EXCHANGE BILIARY DRAIN  07/30/2016  . IR EXCHANGE BILIARY DRAIN  10/01/2016  . IR EXCHANGE BILIARY DRAIN  12/02/2016  . IR EXCHANGE BILIARY DRAIN  12/24/2016  . IR EXCHANGE BILIARY DRAIN  03/17/2017  . IR EXCHANGE BILIARY DRAIN  06/09/2017  . IR EXCHANGE BILIARY DRAIN  09/09/2017  . IR EXCHANGE BILIARY DRAIN  12/02/2017  . IR EXCHANGE BILIARY DRAIN  02/24/2018  . IR EXCHANGE BILIARY DRAIN  06/02/2018  . IR EXCHANGE BILIARY DRAIN  08/25/2018  . IR EXCHANGE BILIARY DRAIN  11/21/2018  . IR EXCHANGE BILIARY DRAIN  02/13/2019  . IR EXCHANGE BILIARY DRAIN  05/08/2019  . IR EXCHANGE BILIARY DRAIN  07/31/2019  . IR EXCHANGE BILIARY DRAIN  10/23/2019  . IR INT EXT BILIARY DRAIN WITH CHOLANGIOGRAM  06/04/2016  . PARTIAL GASTRECTOMY  1992  . PARTIAL HYSTERECTOMY  1968  . PERMANENT PACEMAKER INSERTION N/A 05/16/2014   STJ Assurity dual chamber pacemaker implanted by Dr Lovena Le  . TOTAL ABDOMINAL HYSTERECTOMY  1991   Social History   Occupational History  . Not on file  Tobacco Use  . Smoking status: Never Smoker   . Smokeless tobacco: Never Used  Vaping Use  . Vaping Use: Never used  Substance and Sexual Activity  . Alcohol use: No  . Drug use: No  . Sexual activity: Not on file

## 2020-01-03 ENCOUNTER — Encounter: Payer: Self-pay | Admitting: Orthopedic Surgery

## 2020-01-03 ENCOUNTER — Ambulatory Visit (INDEPENDENT_AMBULATORY_CARE_PROVIDER_SITE_OTHER): Payer: Medicare Other | Admitting: Physician Assistant

## 2020-01-03 DIAGNOSIS — I70233 Atherosclerosis of native arteries of right leg with ulceration of ankle: Secondary | ICD-10-CM

## 2020-01-03 DIAGNOSIS — L97501 Non-pressure chronic ulcer of other part of unspecified foot limited to breakdown of skin: Secondary | ICD-10-CM

## 2020-01-03 NOTE — Progress Notes (Signed)
Office Visit Note   Patient: [name] [name]           Date of Birth: 07-14-1917           MRN: [id number] Visit Date: 01/03/2020              Requested by: Crist Infante, MD [address] [place],  [place] [address] PCP: Crist Infante, MD  Chief Complaint  Patient presents with  . Right Leg - Wound Check      HPI: Patient is a pleasant 16 old woman who is being seen for a necrotic ulcer on the posterior aspect of her right calf.  She thinks this may have been originally from heating pad.  At her last visit she was given a prescription and a VIVE compression sock size medium.  Her daughter took this to [place] near where they live and they were given a different compression stocking overall her daughter thinks she is getting better  Assessment & Plan: Visit Diagnoses: No diagnosis found.  Plan: I explained the option of theVIVE socks and feeling properties of it.  The daughter is going to try to get a pair of these.  We will follow up in 1 week  Follow-Up Instructions: No follow-ups on file.   Ortho Exam  Patient is alert, oriented, no adenopathy, well-dressed, normal affect, normal respiratory effort. Ulcer has fibrinous tissue in the center with some surrounding vascular tissue.  Some mild surrounding erythema but no ascending cellulitis no foul odor mild to moderately tender to palpation.  Serous drainage  Imaging: No results found. No images are attached to the encounter.  Labs: Lab Results  Component Value Date   REPTSTATUS 06/08/2016 FINAL 06/03/2016   CULT  06/03/2016    NO GROWTH 5 DAYS Performed at [facility], [place] [address], [place], [place] [address]    LABORGA ESCHERICHIA COLI 06/01/2016     Lab Results  Component Value Date   ALBUMIN 3.2 (L) 07/26/2016   ALBUMIN 1.9 (L) 06/06/2016   ALBUMIN 2.1 (L) 06/05/2016    Lab Results  Component Value Date   MG 1.7 06/05/2016   No results found for: VD25OH  No results found for:  PREALBUMIN CBC EXTENDED Latest Ref Rng & Units 06/07/2016 06/06/2016 06/05/2016  WBC 4.0 - 10.5 K/uL 12.9(H) 14.3(H) 20.3(H)  RBC 3.87 - 5.11 MIL/uL 2.89(L) 2.96(L) 3.00(L)  HGB 12.0 - 15.0 g/dL 9.4(L) 9.6(L) 9.8(L)  HCT 36.0 - 46.0 % 27.2(L) 27.7(L) 27.6(L)  PLT 150 - 400 K/uL 181 154 144(L)  NEUTROABS 1.7 - 7.7 K/uL 9.9(H) 11.3(H) 17.5(H)  LYMPHSABS 0.7 - 4.0 K/uL 1.5 1.5 1.2     There is no height or weight on file to calculate BMI.  Orders:  No orders of the defined types were placed in this encounter.  No orders of the defined types were placed in this encounter.    Procedures: No procedures performed  Clinical Data: No additional findings.  ROS:  All other systems negative, except as noted in the HPI. Review of Systems  Objective: Vital Signs: There were no vitals taken for this visit.  Specialty Comments:  No specialty comments available.  PMFS History: Patient Active Problem List   Diagnosis Date Noted  . Abnormal CT of the abdomen   . E coli bacteremia 06/04/2016  . Biliary sepsis 06/04/2016  . Fecal impaction ([place]) 06/04/2016  . Pacemaker   . Nausea & vomiting 05/31/2016  . Biliary obstruction 05/31/2016  . Normocytic  anemia 05/31/2016  . Pneumothorax, left 05/17/2014  . Tachy-brady syndrome ([place]) 05/16/2014  . Bradycardia 05/16/2014  . Foot ulcer ([place]) 05/16/2014  . Essential hypertension 05/16/2014  . Atrial flutter ([place]) 05/16/2014  . Sinus node dysfunction - s/p St. Jude (serial number [id number]) PPM   . Atrial fibrillation ([place]) 05/15/2014   Past Medical History:  Diagnosis Date  . Allergic rhinitis   . Anemia   . Atrial fibrillation ([place])    a. on Eliquis  . Biliary obstruction   . Chronic kidney disease (CKD), stage II (mild)   . DDD (degenerative disc disease), lumbar   . Diabetes ([place])   . DJD (degenerative joint disease)   . GERD (gastroesophageal reflux disease)   . Hyperlipidemia   . Hypertension   . Macular degeneration   .  Osteoporosis   . 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 Tachy-brady syndrome (HCC)    a. s/p STJ dual chamber PPM implant  . Vitamin B 12 deficiency   . Vitamin D deficiency   . Whooping cough     Family History  Problem Relation Age of Onset  . Heart disease Father        Dropsy  . Pneumonia Mother   . Sudden death Daughter 29  . Heart attack Son 44  . Colon cancer Neg Hx   . Esophageal cancer Neg Hx   . Pancreatic cancer Neg Hx   . Stomach cancer Neg Hx     Past Surgical History:  Procedure Laterality Date  . BACK SURGERY    . CHOLECYSTECTOMY    . IR EXCHANGE BILIARY DRAIN  06/22/2016  . IR EXCHANGE BILIARY DRAIN  07/30/2016  . IR EXCHANGE BILIARY DRAIN  10/01/2016  . IR EXCHANGE BILIARY DRAIN  12/02/2016  . IR EXCHANGE BILIARY DRAIN  12/24/2016  . IR EXCHANGE BILIARY DRAIN  03/17/2017  . IR EXCHANGE BILIARY DRAIN  06/09/2017  . IR EXCHANGE BILIARY DRAIN  09/09/2017  . IR EXCHANGE BILIARY DRAIN  12/02/2017  . IR EXCHANGE BILIARY DRAIN  02/24/2018  . IR EXCHANGE BILIARY DRAIN  06/02/2018  . IR EXCHANGE BILIARY DRAIN  08/25/2018  . IR EXCHANGE BILIARY DRAIN  11/21/2018  . IR EXCHANGE BILIARY DRAIN  02/13/2019  . IR EXCHANGE BILIARY DRAIN  05/08/2019  . IR EXCHANGE BILIARY DRAIN  07/31/2019  . IR EXCHANGE BILIARY DRAIN  10/23/2019  . IR INT EXT BILIARY DRAIN WITH CHOLANGIOGRAM  06/04/2016  . PARTIAL GASTRECTOMY  1992  . PARTIAL HYSTERECTOMY  1968  . PERMANENT PACEMAKER INSERTION N/A 05/16/2014   STJ Assurity dual chamber pacemaker implanted by Dr Lovena Le  . TOTAL ABDOMINAL HYSTERECTOMY  1991   Social History   Occupational History  . Not on file  Tobacco Use  . Smoking status: Never Smoker  . Smokeless tobacco: Never Used  Vaping Use  . Vaping Use: Never used  Substance and Sexual Activity  . Alcohol use: No  . Drug use: No  . Sexual activity: Not on file

## 2020-01-09 ENCOUNTER — Encounter: Payer: Self-pay | Admitting: Physician Assistant

## 2020-01-09 ENCOUNTER — Ambulatory Visit (INDEPENDENT_AMBULATORY_CARE_PROVIDER_SITE_OTHER): Payer: Medicare Other | Admitting: Physician Assistant

## 2020-01-09 DIAGNOSIS — L97211 Non-pressure chronic ulcer of right calf limited to breakdown of skin: Secondary | ICD-10-CM

## 2020-01-09 NOTE — Progress Notes (Signed)
Office Visit Note   Patient: [name] [name]           Date of Birth: [birth year]           MRN: [id number] Visit Date: 01/09/2020              Requested by: Crist Infante, MD [address] [place],  [place] [address] PCP: Crist Infante, MD  Chief Complaint  Patient presents with  . Right Leg - Follow-up      HPI: Is a pleasant 86 year old woman who presents for reevaluation of her right posterior calf ulcer.  She is accompanied by her daughter.  Her daughter states that she feels she is doing better with the compression sock.  She notices less odor and less erythema.  Assessment & Plan: Visit Diagnoses: No diagnosis found.  Plan: She will continue to rotate socks will follow up in 2 weeks or sooner if she is having any issues  Follow-Up Instructions: No follow-ups on file.   Ortho Exam  Patient is alert, oriented, no adenopathy, well-dressed, normal affect, normal respiratory effort. Examination less erythema since last visit.  No odor no surrounding cellulitis.  Measures about 4 cm in circumference there is some good vascular tissue mixed with fibrinous tissue.  Imaging: No results found. No images are attached to the encounter.  Labs: Lab Results  Component Value Date   REPTSTATUS 06/08/2016 FINAL 06/03/2016   CULT  06/03/2016    NO GROWTH 5 DAYS Performed at [facility], [place] [address], [place], [place] [address]    LABORGA ESCHERICHIA COLI 06/01/2016     Lab Results  Component Value Date   ALBUMIN 3.2 (L) 07/26/2016   ALBUMIN 1.9 (L) 06/06/2016   ALBUMIN 2.1 (L) 06/05/2016    Lab Results  Component Value Date   MG 1.7 06/05/2016   No results found for: VD25OH  No results found for: PREALBUMIN CBC EXTENDED Latest Ref Rng & Units 06/07/2016 06/06/2016 06/05/2016  WBC 4.0 - 10.5 K/uL 12.9(H) 14.3(H) 20.3(H)  RBC 3.87 - 5.11 MIL/uL 2.89(L) 2.96(L) 3.00(L)  HGB 12.0 - 15.0 g/dL 9.4(L) 9.6(L) 9.8(L)  HCT 36.0 - 46.0 % 27.2(L) 27.7(L) 27.6(L)  PLT  150 - 400 K/uL 181 154 144(L)  NEUTROABS 1.7 - 7.7 K/uL 9.9(H) 11.3(H) 17.5(H)  LYMPHSABS 0.7 - 4.0 K/uL 1.5 1.5 1.2     There is no height or weight on file to calculate BMI.  Orders:  No orders of the defined types were placed in this encounter.  No orders of the defined types were placed in this encounter.    Procedures: No procedures performed  Clinical Data: No additional findings.  ROS:  All other systems negative, except as noted in the HPI. Review of Systems  Objective: Vital Signs: There were no vitals taken for this visit.  Specialty Comments:  No specialty comments available.  PMFS History: Patient Active Problem List   Diagnosis Date Noted  . Abnormal CT of the abdomen   . E coli bacteremia 06/04/2016  . Biliary sepsis 06/04/2016  . Fecal impaction ([place]) 06/04/2016  . Pacemaker   . Nausea & vomiting 05/31/2016  . Biliary obstruction 05/31/2016  . Normocytic anemia 05/31/2016  . Pneumothorax, left 05/17/2014  . Tachy-brady syndrome ([place]) 05/16/2014  . Bradycardia 05/16/2014  . Foot ulcer ([place]) 05/16/2014  . Essential hypertension 05/16/2014  . Atrial flutter ([place]) 05/16/2014  . Sinus node dysfunction - s/p St. Jude (serial number [id number]) PPM   . Atrial fibrillation (  [place]) 05/15/2014   Past Medical History:  Diagnosis Date  . Allergic rhinitis   . Anemia   . Atrial fibrillation ([place])    a. on Eliquis  . Biliary obstruction   . Chronic kidney disease (CKD), stage II (mild)   . DDD (degenerative disc disease), lumbar   . Diabetes ([place])   . DJD (degenerative joint disease)   . GERD (gastroesophageal reflux disease)   . Hyperlipidemia   . Hypertension   . Macular degeneration   . Osteoporosis   . 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 Tachy-brady syndrome (HCC)    a. s/p STJ dual chamber PPM implant  . Vitamin B 12 deficiency   . Vitamin D deficiency   . Whooping cough     Family History  Problem Relation Age of Onset  . Heart disease Father         Dropsy  . Pneumonia Mother   . Sudden death Daughter 55  . Heart attack Son 8  . Colon cancer Neg Hx   . Esophageal cancer Neg Hx   . Pancreatic cancer Neg Hx   . Stomach cancer Neg Hx     Past Surgical History:  Procedure Laterality Date  . BACK SURGERY    . CHOLECYSTECTOMY    . IR EXCHANGE BILIARY DRAIN  06/22/2016  . IR EXCHANGE BILIARY DRAIN  07/30/2016  . IR EXCHANGE BILIARY DRAIN  10/01/2016  . IR EXCHANGE BILIARY DRAIN  12/02/2016  . IR EXCHANGE BILIARY DRAIN  12/24/2016  . IR EXCHANGE BILIARY DRAIN  03/17/2017  . IR EXCHANGE BILIARY DRAIN  06/09/2017  . IR EXCHANGE BILIARY DRAIN  09/09/2017  . IR EXCHANGE BILIARY DRAIN  12/02/2017  . IR EXCHANGE BILIARY DRAIN  02/24/2018  . IR EXCHANGE BILIARY DRAIN  06/02/2018  . IR EXCHANGE BILIARY DRAIN  08/25/2018  . IR EXCHANGE BILIARY DRAIN  11/21/2018  . IR EXCHANGE BILIARY DRAIN  02/13/2019  . IR EXCHANGE BILIARY DRAIN  05/08/2019  . IR EXCHANGE BILIARY DRAIN  07/31/2019  . IR EXCHANGE BILIARY DRAIN  10/23/2019  . IR INT EXT BILIARY DRAIN WITH CHOLANGIOGRAM  06/04/2016  . PARTIAL GASTRECTOMY  1992  . PARTIAL HYSTERECTOMY  1968  . PERMANENT PACEMAKER INSERTION N/A 05/16/2014   STJ Assurity dual chamber pacemaker implanted by Dr Lovena Le  . TOTAL ABDOMINAL HYSTERECTOMY  1991   Social History   Occupational History  . Not on file  Tobacco Use  . Smoking status: Never Smoker  . Smokeless tobacco: Never Used  Vaping Use  . Vaping Use: Never used  Substance and Sexual Activity  . Alcohol use: No  . Drug use: No  . Sexual activity: Not on file

## 2020-01-14 ENCOUNTER — Telehealth: Payer: Self-pay | Admitting: Physician Assistant

## 2020-01-14 NOTE — Telephone Encounter (Signed)
This pt was in the office 01/09/20 there was some fibrinous tissue of the wound but no signs of infection. Pt's daughter is concerned about tissue and requesting ABX. Please advise.

## 2020-01-14 NOTE — Telephone Encounter (Signed)
Pts daughter Vickii Chafe called stating the pts ulcer is starting to get a yellow color to it and he believes the pt would benefit from an antibiotic. Peggy would like to have this called into the [facility] pharmacy in [place] and would like a CB to notify her of when it's been sent.   [phone]

## 2020-01-15 ENCOUNTER — Other Ambulatory Visit: Payer: Self-pay | Admitting: Physician Assistant

## 2020-01-15 ENCOUNTER — Ambulatory Visit (HOSPITAL_COMMUNITY)
Admission: RE | Admit: 2020-01-15 | Discharge: 2020-01-15 | Disposition: A | Discharge status: Home / Self Care | Payer: Medicare Other | Source: Ambulatory Visit | Attending: Interventional Radiology | Admitting: Interventional Radiology

## 2020-01-15 ENCOUNTER — Other Ambulatory Visit: Payer: Self-pay

## 2020-01-15 ENCOUNTER — Other Ambulatory Visit (HOSPITAL_COMMUNITY): Payer: Self-pay | Admitting: Diagnostic Radiology

## 2020-01-15 DIAGNOSIS — Z434 Encounter for attention to other artificial openings of digestive tract: Secondary | ICD-10-CM | POA: Diagnosis not present

## 2020-01-15 DIAGNOSIS — K831 Obstruction of bile duct: Secondary | ICD-10-CM

## 2020-01-15 HISTORY — PX: IR EXCHANGE BILIARY DRAIN: IMG6046

## 2020-01-15 MED ORDER — IOHEXOL 300 MG/ML  SOLN
50.0000 mL | Freq: Once | INTRAMUSCULAR | Status: AC | PRN
  Administered 2020-01-15: 15 mL

## 2020-01-15 MED ORDER — LIDOCAINE HCL 1 % IJ SOLN
INTRAMUSCULAR | Status: AC
  Filled 2020-01-15: qty 20

## 2020-01-15 MED ORDER — DOXYCYCLINE HYCLATE 100 MG PO TABS: 100.0000 mg | ORAL_TABLET | Freq: Two times a day (BID) | ORAL | 0 refills | Status: AC

## 2020-01-15 NOTE — Telephone Encounter (Signed)
I called and sw pt's daughter to advise of message below. To call with any questions.

## 2020-01-15 NOTE — Telephone Encounter (Signed)
Doxycycline called in but if gets any worse needs to be seen and also should have a follow up with [place]

## 2020-01-15 NOTE — Procedures (Signed)
Interventional Radiology Procedure:   Indications: Chronic biliary stricture  Procedure: Routine biliary drain exchange  Findings: Drain is well positioned with tip in duodenum.  Complications: None     EBL: None  Plan: Routine exchange in 12 weeks.   Alease Fait R. Anselm Pancoast, MD  Pager: [phone]

## 2020-01-21 ENCOUNTER — Ambulatory Visit (INDEPENDENT_AMBULATORY_CARE_PROVIDER_SITE_OTHER): Payer: Medicare Other

## 2020-01-21 DIAGNOSIS — I495 Sick sinus syndrome: Secondary | ICD-10-CM

## 2020-01-21 LAB — CUP PACEART REMOTE DEVICE CHECK
Battery Remaining Longevity: 114 mo
Battery Remaining Percentage: 95.5 %
Battery Voltage: 2.99 V
Brady Statistic AP VP Percent: 1 %
Brady Statistic AP VS Percent: 58 %
Brady Statistic AS VP Percent: 1 %
Brady Statistic AS VS Percent: 41 %
Brady Statistic RA Percent Paced: 57 %
Brady Statistic RV Percent Paced: 1 %
Date Time Interrogation Session: 20211227020014
Implantable Lead Implant Date: 20160421
Implantable Lead Implant Date: 20160421
Implantable Lead Location: 753859
Implantable Lead Location: 753860
Implantable Pulse Generator Implant Date: 20160421
Lead Channel Impedance Value: 330 Ohm
Lead Channel Impedance Value: 530 Ohm
Lead Channel Pacing Threshold Amplitude: 0.5 V
Lead Channel Pacing Threshold Amplitude: 1.25 V
Lead Channel Pacing Threshold Pulse Width: 0.5 ms
Lead Channel Pacing Threshold Pulse Width: 0.5 ms
Lead Channel Sensing Intrinsic Amplitude: 0.9 mV
Lead Channel Sensing Intrinsic Amplitude: 12 mV
Lead Channel Setting Pacing Amplitude: 2 V
Lead Channel Setting Pacing Amplitude: 2.5 V
Lead Channel Setting Pacing Pulse Width: 0.5 ms
Lead Channel Setting Sensing Sensitivity: 2 mV
Pulse Gen Model: 2240
Pulse Gen Serial Number: 7761558

## 2020-01-22 ENCOUNTER — Encounter: Payer: Self-pay | Admitting: Physician Assistant

## 2020-01-22 ENCOUNTER — Ambulatory Visit (INDEPENDENT_AMBULATORY_CARE_PROVIDER_SITE_OTHER): Payer: Medicare Other | Admitting: Physician Assistant

## 2020-01-22 DIAGNOSIS — L97211 Non-pressure chronic ulcer of right calf limited to breakdown of skin: Secondary | ICD-10-CM | POA: Diagnosis not present

## 2020-01-22 NOTE — Progress Notes (Signed)
Office Visit Note   Patient: [name] [name]           Date of Birth: [birth year]           MRN: [id number] Visit Date: 01/22/2020              Requested by: Rodrigo Ran, MD [address] [place],  [place] [address] PCP: Rodrigo Ran, MD  No chief complaint on file.     HPI: This is a pleasant 86 year old woman who we have been following for a ulcer on the posterior calf of the right leg.  Her daughter has been changing her vive compression sock.  She did ask for short course of antibiotics which we prescribed for her mother.  She will complete these on Saturday the patient states she does feel better  Assessment & Plan: Visit Diagnoses: No diagnosis found.  Plan: Follow-up in 2 weeks sooner if any concerns continue with daily sock change  Follow-Up Instructions: No follow-ups on file.   Ortho Exam  Patient is alert, oriented, no adenopathy, well-dressed, normal affect, normal respiratory effort. Focused examination posterior calf ulcer measures 4 cm in diameter.  It has good healthy tissue.  No foul odor no fibrinous tissue.  Her swelling is very well controlled she has no cellulitis her compartments are soft she has a small eschar on the anterior aspect of her ankle that is improving as well  Imaging: No results found. No images are attached to the encounter.  Labs: Lab Results  Component Value Date   REPTSTATUS 06/08/2016 FINAL 06/03/2016   CULT  06/03/2016    NO GROWTH 5 DAYS Performed at [facility], [address], [place], [place] [address]    [employer] ESCHERICHIA COLI 06/01/2016     Lab Results  Component Value Date   ALBUMIN 3.2 (L) 07/26/2016   ALBUMIN 1.9 (L) 06/06/2016   ALBUMIN 2.1 (L) 06/05/2016    Lab Results  Component Value Date   MG 1.7 06/05/2016   No results found for: VD25OH  No results found for: PREALBUMIN CBC EXTENDED Latest Ref Rng & Units 06/07/2016 06/06/2016 06/05/2016  WBC 4.0 - 10.5 K/uL 12.9(H) 14.3(H) 20.3(H)  RBC 3.87  - 5.11 MIL/uL 2.89(L) 2.96(L) 3.00(L)  HGB 12.0 - 15.0 g/dL 1.7(P) 1.0(C) 5.8(N)  HCT 36.0 - 46.0 % 27.2(L) 27.7(L) 27.6(L)  PLT 150 - 400 K/uL 181 154 144(L)  NEUTROABS 1.7 - 7.7 K/uL 9.9(H) 11.3(H) 17.5(H)  LYMPHSABS 0.7 - 4.0 K/uL 1.5 1.5 1.2     There is no height or weight on file to calculate BMI.  Orders:  No orders of the defined types were placed in this encounter.  No orders of the defined types were placed in this encounter.    Procedures: No procedures performed  Clinical Data: No additional findings.  ROS:  All other systems negative, except as noted in the HPI. Review of Systems  Objective: Vital Signs: There were no vitals taken for this visit.  Specialty Comments:  No specialty comments available.  PMFS History: Patient Active Problem List   Diagnosis Date Noted  . Abnormal CT of the abdomen   . E coli bacteremia 06/04/2016  . Biliary sepsis 06/04/2016  . Fecal impaction (HCC) 06/04/2016  . Pacemaker   . Nausea & vomiting 05/31/2016  . Biliary obstruction 05/31/2016  . Normocytic anemia 05/31/2016  . Pneumothorax, left 05/17/2014  . Tachy-brady syndrome (HCC) 05/16/2014  . Bradycardia 05/16/2014  . Foot ulcer (HCC) 05/16/2014  .  Essential hypertension 05/16/2014  . Atrial flutter ([place]) 05/16/2014  . Sinus node dysfunction - s/p St. Jude (serial number [id number]) PPM   . Atrial fibrillation ([place]) 05/15/2014   Past Medical History:  Diagnosis Date  . Allergic rhinitis   . Anemia   . Atrial fibrillation ([place])    a. on Eliquis  . Biliary obstruction   . Chronic kidney disease (CKD), stage II (mild)   . DDD (degenerative disc disease), lumbar   . Diabetes ([place])   . DJD (degenerative joint disease)   . GERD (gastroesophageal reflux disease)   . Hyperlipidemia   . Hypertension   . Macular degeneration   . Osteoporosis   . 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 Tachy-brady syndrome (HCC)    a. s/p STJ dual chamber PPM implant  . Vitamin B 12  deficiency   . Vitamin D deficiency   . Whooping cough     Family History  Problem Relation Age of Onset  . Heart disease Father        Dropsy  . Pneumonia Mother   . Sudden death Daughter 14  . Heart attack Son 78  . Colon cancer Neg Hx   . Esophageal cancer Neg Hx   . Pancreatic cancer Neg Hx   . Stomach cancer Neg Hx     Past Surgical History:  Procedure Laterality Date  . BACK SURGERY    . CHOLECYSTECTOMY    . IR EXCHANGE BILIARY DRAIN  06/22/2016  . IR EXCHANGE BILIARY DRAIN  07/30/2016  . IR EXCHANGE BILIARY DRAIN  10/01/2016  . IR EXCHANGE BILIARY DRAIN  12/02/2016  . IR EXCHANGE BILIARY DRAIN  12/24/2016  . IR EXCHANGE BILIARY DRAIN  03/17/2017  . IR EXCHANGE BILIARY DRAIN  06/09/2017  . IR EXCHANGE BILIARY DRAIN  09/09/2017  . IR EXCHANGE BILIARY DRAIN  12/02/2017  . IR EXCHANGE BILIARY DRAIN  02/24/2018  . IR EXCHANGE BILIARY DRAIN  06/02/2018  . IR EXCHANGE BILIARY DRAIN  08/25/2018  . IR EXCHANGE BILIARY DRAIN  11/21/2018  . IR EXCHANGE BILIARY DRAIN  02/13/2019  . IR EXCHANGE BILIARY DRAIN  05/08/2019  . IR EXCHANGE BILIARY DRAIN  07/31/2019  . IR EXCHANGE BILIARY DRAIN  10/23/2019  . IR EXCHANGE BILIARY DRAIN  01/15/2020  . IR INT EXT BILIARY DRAIN WITH CHOLANGIOGRAM  06/04/2016  . PARTIAL GASTRECTOMY  1992  . PARTIAL HYSTERECTOMY  1968  . PERMANENT PACEMAKER INSERTION N/A 05/16/2014   STJ Assurity dual chamber pacemaker implanted by Dr Lovena Le  . TOTAL ABDOMINAL HYSTERECTOMY  1991   Social History   Occupational History  . Not on file  Tobacco Use  . Smoking status: Never Smoker  . Smokeless tobacco: Never Used  Vaping Use  . Vaping Use: Never used  Substance and Sexual Activity  . Alcohol use: No  . Drug use: No  . Sexual activity: Not on file

## 2020-01-29 DIAGNOSIS — D649 Anemia, unspecified: Secondary | ICD-10-CM | POA: Diagnosis not present

## 2020-01-29 DIAGNOSIS — E538 Deficiency of other specified B group vitamins: Secondary | ICD-10-CM | POA: Diagnosis not present

## 2020-01-29 DIAGNOSIS — M810 Age-related osteoporosis without current pathological fracture: Secondary | ICD-10-CM | POA: Diagnosis not present

## 2020-01-29 DIAGNOSIS — E1169 Type 2 diabetes mellitus with other specified complication: Secondary | ICD-10-CM | POA: Diagnosis not present

## 2020-01-29 DIAGNOSIS — E039 Hypothyroidism, unspecified: Secondary | ICD-10-CM | POA: Diagnosis not present

## 2020-01-29 DIAGNOSIS — I739 Peripheral vascular disease, unspecified: Secondary | ICD-10-CM | POA: Diagnosis not present

## 2020-01-29 DIAGNOSIS — I4891 Unspecified atrial fibrillation: Secondary | ICD-10-CM | POA: Diagnosis not present

## 2020-01-29 DIAGNOSIS — M179 Osteoarthritis of knee, unspecified: Secondary | ICD-10-CM | POA: Diagnosis not present

## 2020-01-29 DIAGNOSIS — I129 Hypertensive chronic kidney disease with stage 1 through stage 4 chronic kidney disease, or unspecified chronic kidney disease: Secondary | ICD-10-CM | POA: Diagnosis not present

## 2020-01-29 DIAGNOSIS — I87311 Chronic venous hypertension (idiopathic) with ulcer of right lower extremity: Secondary | ICD-10-CM | POA: Diagnosis not present

## 2020-01-29 DIAGNOSIS — N1832 Chronic kidney disease, stage 3b: Secondary | ICD-10-CM | POA: Diagnosis not present

## 2020-01-29 DIAGNOSIS — D6869 Other thrombophilia: Secondary | ICD-10-CM | POA: Diagnosis not present

## 2020-01-30 ENCOUNTER — Telehealth: Payer: Self-pay | Admitting: Student

## 2020-01-30 ENCOUNTER — Emergency Department (HOSPITAL_COMMUNITY): Payer: Medicare Other

## 2020-01-30 ENCOUNTER — Encounter (HOSPITAL_BASED_OUTPATIENT_CLINIC_OR_DEPARTMENT_OTHER): Payer: Medicare Other | Admitting: Physician Assistant

## 2020-01-30 ENCOUNTER — Emergency Department (HOSPITAL_COMMUNITY)
Admission: EM | Admit: 2020-01-30 | Discharge: 2020-01-30 | Disposition: A | Discharge status: Home / Self Care | Payer: Medicare Other | Attending: Emergency Medicine | Admitting: Emergency Medicine

## 2020-01-30 DIAGNOSIS — I129 Hypertensive chronic kidney disease with stage 1 through stage 4 chronic kidney disease, or unspecified chronic kidney disease: Secondary | ICD-10-CM | POA: Diagnosis not present

## 2020-01-30 DIAGNOSIS — R55 Syncope and collapse: Secondary | ICD-10-CM | POA: Diagnosis not present

## 2020-01-30 DIAGNOSIS — N182 Chronic kidney disease, stage 2 (mild): Secondary | ICD-10-CM | POA: Diagnosis not present

## 2020-01-30 DIAGNOSIS — E1122 Type 2 diabetes mellitus with diabetic chronic kidney disease: Secondary | ICD-10-CM | POA: Insufficient documentation

## 2020-01-30 DIAGNOSIS — Z7901 Long term (current) use of anticoagulants: Secondary | ICD-10-CM | POA: Insufficient documentation

## 2020-01-30 DIAGNOSIS — Z79899 Other long term (current) drug therapy: Secondary | ICD-10-CM | POA: Insufficient documentation

## 2020-01-30 DIAGNOSIS — R531 Weakness: Secondary | ICD-10-CM | POA: Diagnosis not present

## 2020-01-30 DIAGNOSIS — R0902 Hypoxemia: Secondary | ICD-10-CM | POA: Diagnosis not present

## 2020-01-30 LAB — COMPREHENSIVE METABOLIC PANEL
ALT: 15 U/L (ref 0–44)
AST: 21 U/L (ref 15–41)
Albumin: 2.8 g/dL — ABNORMAL LOW (ref 3.5–5.0)
Alkaline Phosphatase: 135 U/L — ABNORMAL HIGH (ref 38–126)
Anion gap: 10 (ref 5–15)
BUN: 38 mg/dL — ABNORMAL HIGH (ref 8–23)
CO2: 20 mmol/L — ABNORMAL LOW (ref 22–32)
Calcium: 8.9 mg/dL (ref 8.9–10.3)
Chloride: 107 mmol/L (ref 98–111)
Creatinine, Ser: 1.65 mg/dL — ABNORMAL HIGH (ref 0.44–1.00)
GFR, Estimated: 28 mL/min — ABNORMAL LOW (ref >60)
Glucose, Bld: 129 mg/dL — ABNORMAL HIGH (ref 70–99)
Potassium: 3.2 mmol/L — ABNORMAL LOW (ref 3.5–5.1)
Sodium: 137 mmol/L (ref 135–145)
Total Bilirubin: 0.7 mg/dL (ref 0.3–1.2)
Total Protein: 6.1 g/dL — ABNORMAL LOW (ref 6.5–8.1)

## 2020-01-30 LAB — CBC
HCT: 31.5 % — ABNORMAL LOW (ref 36.0–46.0)
Hemoglobin: 9.9 g/dL — ABNORMAL LOW (ref 12.0–15.0)
MCH: 33.3 pg (ref 26.0–34.0)
MCHC: 31.4 g/dL (ref 30.0–36.0)
MCV: 106.1 fL — ABNORMAL HIGH (ref 80.0–100.0)
Platelets: 243 10*3/uL (ref 150–400)
RBC: 2.97 MIL/uL — ABNORMAL LOW (ref 3.87–5.11)
RDW: 13.9 % (ref 11.5–15.5)
WBC: 10.6 10*3/uL — ABNORMAL HIGH (ref 4.0–10.5)
nRBC: 0 % (ref 0.0–0.2)

## 2020-01-30 LAB — TROPONIN I (HIGH SENSITIVITY): Troponin I (High Sensitivity): 26 ng/L — ABNORMAL HIGH (ref <18)

## 2020-01-30 LAB — CBG MONITORING, ED: Glucose-Capillary: 101 mg/dL — ABNORMAL HIGH (ref 70–99)

## 2020-01-30 NOTE — Discharge Instructions (Signed)
The pacemaker was normal today and working fine.  No heart events around what happened today.  Her kidney function and blood counts are not terribly different from her test in July and her chest x-ray was ok.  If she has any more episodes she needs to follow up with Dr. Waynard Edwards or return to the ER.  If she starts having chest pain, shortness of breath, vomiting or other concerns please return.

## 2020-01-30 NOTE — Telephone Encounter (Signed)
Spoke with [name] [name] daughter and pt was transported to [facility] ED for afib and wanted to let Dr Ladona Ridgel Know Will forward message to Dr Ladona Ridgel for review .[name]

## 2020-01-30 NOTE — ED Notes (Signed)
Collected CBG from pt, 101

## 2020-01-30 NOTE — Telephone Encounter (Signed)
Alert 01/30/19 received for long AT/AF episode; logged 1 day 1 hour; EGM suggest A. Fib - ongoing; V rates controlled. History of AF and on apixaban, diltiazem and metoprolol.   Discussed with pts daughter at home number. Pt is in her USOH. Pt has not had any SOB, dizziness, lightheadedness, or other symptoms.  She knows to call if she has any issues, and will otherwise continue her same medications and follow up with [place] next month.   Casimiro Needle [address] [place], PA-C  01/30/2020 9:08 AM

## 2020-01-30 NOTE — ED Triage Notes (Signed)
Pt arrives via ems from [place] family found pt in a chair and was unable to wake her up for 2-3 minutes. Ems noted afib with multiple pvcs. Pt arrives to ED alert and oriented to person place and situation. Pt is pale appearing.

## 2020-01-30 NOTE — ED Provider Notes (Signed)
[facility] EMERGENCY DEPARTMENT Provider Note   CSN: [id number] Arrival date & time: 01/30/20  1342     History Chief Complaint  Patient presents with  . Altered Mental Status    MINH HAGMAN is a 87 y.o. female.  Patient is a 87 year old female with a history of atrial fibrillation on Eliquis, tachybradycardia syndrome status post [place] pacemaker, hypertension, CKD, prior stomach cancer and chronic biliary drain who presents today after what sounds like a loss of consciousness.  Patient reports that she got up this morning and was feeling her normal self she had eaten breakfast and then sat down in the chair.  Around noon her daughter was unable to wake her up.  And then when she did wake up she was only semiconscious and reported she did not feel good.  This episode lasted approximately 10 to 15 minutes and she was having some nausea during that time.  They did call paramedics who came out and evaluated the patient.  She reports by the time they got there she was feeling back to normal.  However they recommended she come to be evaluated because they were concerned her pacemaker was not working.  Patient did see her primary care doctor Dr. Joylene Draft yesterday and had a general check and had been doing well.  She did have labs drawn but they have not received the results yet.  She denies any recent cough, congestion, fever.  She has not had any palpitations or chest pain today.  She denies any abdominal pain nausea or or vomiting currently but did report for a short time she had leg pain earlier today.  She has had been having bowel movements and her daughter corroborates her story.  She has had no recent medication changes.  The history is provided by the patient and a caregiver.  Altered Mental Status      Past Medical History:  Diagnosis Date  . Allergic rhinitis   . Anemia   . Atrial fibrillation ([place])    a. on Eliquis  . Biliary obstruction   . Chronic  kidney disease (CKD), stage II (mild)   . DDD (degenerative disc disease), lumbar   . Diabetes ([place])   . DJD (degenerative joint disease)   . GERD (gastroesophageal reflux disease)   . Hyperlipidemia   . Hypertension   . Macular degeneration   . Osteoporosis   . Stomach cancer ([place])    1992  . Tachy-brady syndrome (HCC)    a. s/p STJ dual chamber PPM implant  . Vitamin B 12 deficiency   . Vitamin D deficiency   . Whooping cough     Patient Active Problem List   Diagnosis Date Noted  . Abnormal CT of the abdomen   . E coli bacteremia 06/04/2016  . Biliary sepsis 06/04/2016  . Fecal impaction ([place]) 06/04/2016  . Pacemaker   . Nausea & vomiting 05/31/2016  . Biliary obstruction 05/31/2016  . Normocytic anemia 05/31/2016  . Pneumothorax, left 05/17/2014  . Tachy-brady syndrome ([place]) 05/16/2014  . Bradycardia 05/16/2014  . Foot ulcer ([place]) 05/16/2014  . Essential hypertension 05/16/2014  . Atrial flutter ([place]) 05/16/2014  . Sinus node dysfunction - s/p St. Jude (serial number [id number]) PPM   . Atrial fibrillation ([place]) 05/15/2014    Past Surgical History:  Procedure Laterality Date  . BACK SURGERY    . CHOLECYSTECTOMY    . IR EXCHANGE BILIARY DRAIN  06/22/2016  . IR EXCHANGE BILIARY DRAIN  07/30/2016  .  IR EXCHANGE BILIARY DRAIN  10/01/2016  . IR EXCHANGE BILIARY DRAIN  12/02/2016  . IR EXCHANGE BILIARY DRAIN  12/24/2016  . IR EXCHANGE BILIARY DRAIN  03/17/2017  . IR EXCHANGE BILIARY DRAIN  06/09/2017  . IR EXCHANGE BILIARY DRAIN  09/09/2017  . IR EXCHANGE BILIARY DRAIN  12/02/2017  . IR EXCHANGE BILIARY DRAIN  02/24/2018  . IR EXCHANGE BILIARY DRAIN  06/02/2018  . IR EXCHANGE BILIARY DRAIN  08/25/2018  . IR EXCHANGE BILIARY DRAIN  11/21/2018  . IR EXCHANGE BILIARY DRAIN  02/13/2019  . IR EXCHANGE BILIARY DRAIN  05/08/2019  . IR EXCHANGE BILIARY DRAIN  07/31/2019  . IR EXCHANGE BILIARY DRAIN  10/23/2019  . IR EXCHANGE BILIARY DRAIN  01/15/2020  . IR INT EXT BILIARY DRAIN WITH  CHOLANGIOGRAM  06/04/2016  . PARTIAL GASTRECTOMY  1992  . PARTIAL HYSTERECTOMY  1968  . PERMANENT PACEMAKER INSERTION N/A 05/16/2014   STJ Assurity dual chamber pacemaker implanted by Dr Ladona Ridgel  . TOTAL ABDOMINAL HYSTERECTOMY  1991     OB History   No obstetric history on file.     Family History  Problem Relation Age of Onset  . Heart disease Father        Dropsy  . Pneumonia Mother   . Sudden death Daughter 13  . Heart attack Son 38  . Colon cancer Neg Hx   . Esophageal cancer Neg Hx   . Pancreatic cancer Neg Hx   . Stomach cancer Neg Hx     Social History   Tobacco Use  . Smoking status: Never Smoker  . Smokeless tobacco: Never Used  Vaping Use  . Vaping Use: Never used  Substance Use Topics  . Alcohol use: No  . Drug use: No    Home Medications Prior to Admission medications   Medication Sig Start Date End Date Taking? Authorizing Provider  acetaminophen (TYLENOL) 500 MG tablet Take 500 mg by mouth every 6 (six) hours as needed for mild pain.    [provider]  atorvastatin (LIPITOR) 20 MG tablet TAKE 1 TABLET (20 MG TOTAL) BY MOUTH DAILY AT 6 PM. 09/26/19   Marinus Maw, MD  Calcium Carbonate-Vitamin D (CALCIUM 600/VITAMIN D PO) Take 2 tablets by mouth daily.    [provider]  Cyanocobalamin (NASCOBAL NA) Place 1 spray into both nostrils once a week. saturday    [provider]  denosumab (PROLIA) 60 MG/ML SOLN injection Inject 60 mg into the skin every 6 (six) months. Administer in upper arm, thigh, or abdomen    [provider]  diltiazem (CARDIZEM CD) 120 MG 24 hr capsule TAKE 1 CAPSULE BY MOUTH EVERY DAY 10/15/19   Marinus Maw, MD  doxycycline (VIBRA-TABS) 100 MG tablet Take 1 tablet (100 mg total) by mouth 2 (two) times daily. 01/15/20   Persons, [place], [place]  ELIQUIS 2.5 MG TABS tablet TAKE 1 TABLET BY MOUTH TWICE A DAY 01/31/17   Marinus Maw, MD  Ergocalciferol (VITAMIN D2 PO) Take 1.25 mg by mouth once a week.     [provider]  ferrous sulfate 325 (65 FE) MG tablet Take 325 mg by mouth daily with breakfast.    [provider]  Fexofenadine HCl (MUCINEX ALLERGY PO) Take by mouth.    [provider]  hydrochlorothiazide (HYDRODIURIL) 25 MG tablet Take 12.5 mg by mouth daily.    [provider]  levothyroxine (SYNTHROID) 25 MCG tablet Take 25 mcg by mouth every morning.  07/10/18   [provider]  loratadine (CLARITIN) 10 MG tablet Take 10 mg by mouth daily.    [provider]  LORazepam (ATIVAN) 1 MG tablet Take 1 mg by mouth at bedtime.    [provider]  metoprolol (LOPRESSOR) 50 MG tablet Take 50 mg by mouth 2 (two) times daily.    [provider]  mometasone (NASONEX) 50 MCG/ACT nasal spray Place 2 sprays into both nostrils daily.    [provider]  Multiple Vitamins-Minerals (MULTIVITAMIN WITH MINERALS) tablet Take 1 tablet by mouth daily.    [provider]  Omega-3 Fatty Acids (FISH OIL PO) Take 12 mg by mouth daily.    [provider]  Polyethyl Glycol-Propyl Glycol 0.4-0.3 % SOLN Apply 1 drop to eye daily as needed (dryness).    [provider]  sucralfate (CARAFATE) 1 G tablet Take 1 g by mouth 4 (four) times daily -  before meals and at bedtime.    [provider]  traMADol (ULTRAM) 50 MG tablet Take 50 mg by mouth 2 (two) times daily.     [provider]    Allergies    Patient has no known allergies.  Review of Systems   Review of Systems  All other systems reviewed and are negative.   Physical Exam Updated Vital Signs BP 125/67 (BP Location: Right Arm)   Pulse 60   Temp 98 F (36.7 C) (Oral)   Resp 20   SpO2 93%   Physical Exam Vitals and nursing note reviewed.  Constitutional:      General: She is not in acute distress.    Appearance: Normal appearance. She is well-developed, normal weight and well-nourished.  HENT:     Head: Normocephalic and  atraumatic.  Eyes:     Extraocular Movements: EOM normal.     Pupils: Pupils are equal, round, and reactive to light.  Cardiovascular:     Rate and Rhythm: Normal rate. Rhythm irregularly irregular.     Pulses: Normal pulses and intact distal pulses.     Heart sounds: Murmur heard.   Systolic murmur is present with a grade of 2/6. No friction rub.  Pulmonary:     Effort: Pulmonary effort is normal.     Breath sounds: Normal breath sounds. No wheezing or rales.  Abdominal:     General: Bowel sounds are normal. There is no distension.     Palpations: Abdomen is soft.     Tenderness: There is no abdominal tenderness. There is no guarding or rebound.     Comments: Drain placed in the LUQ  Musculoskeletal:        General: No tenderness. Normal range of motion.     Right lower leg: No edema.     Left lower leg: No edema.     Comments: No edema.  Right posterior calf ulcer without drainage or induration.  2+ DP pulses bilaterally  Skin:    General: Skin is warm and dry.     Capillary Refill: Capillary refill takes less than 2 seconds.     Findings: No rash.  Neurological:     Mental Status: She is alert and oriented to person, place, and time. Mental status is at baseline.     Cranial Nerves: No cranial nerve deficit.  Psychiatric:        Mood and Affect: Mood and affect and mood normal.        Behavior: Behavior normal.  Thought Content: Thought content normal.     ED Results / Procedures / Treatments   Labs (all labs ordered are listed, but only abnormal results are displayed) Labs Reviewed  COMPREHENSIVE METABOLIC PANEL - Abnormal; Notable for the following components:      Result Value   Potassium 3.2 (*)    CO2 20 (*)    Glucose, Bld 129 (*)    BUN 38 (*)    Creatinine, Ser 1.65 (*)    Total Protein 6.1 (*)    Albumin 2.8 (*)    Alkaline Phosphatase 135 (*)    GFR, Estimated 28 (*)    All other components within normal limits  CBC - Abnormal; Notable for the  following components:   WBC 10.6 (*)    RBC 2.97 (*)    Hemoglobin 9.9 (*)    HCT 31.5 (*)    MCV 106.1 (*)    All other components within normal limits  CBG MONITORING, ED - Abnormal; Notable for the following components:   Glucose-Capillary 101 (*)    All other components within normal limits  TROPONIN I (HIGH SENSITIVITY)    EKG EKG Interpretation  Date/Time:  Wednesday January 30 2020 13:48:36 EST Ventricular Rate:  72 PR Interval:    QRS Duration: 88 QT Interval:  404 QTC Calculation: 442 R Axis:   99 Text Interpretation: Atrial fibrillation with frequent ventricular-paced complexes and with premature ventricular or aberrantly conducted complexes Rightward axis new ST & T wave abnormality, consider inferior ischemia Confirmed by Blanchie Dessert [id number]) on 01/30/2020 3:40:25 PM   Radiology DG Chest Port 1 View  Result Date: 01/30/2020 CLINICAL DATA:  Syncope EXAM: PORTABLE CHEST 1 VIEW COMPARISON:  May 20, 2014 FINDINGS: There is slight scarring in the lateral left base. The lungs elsewhere are clear. There is cardiomegaly with pulmonary vascularity normal. There is a pacemaker on the left with lead tips attached to the right atrium and right ventricle. There is aortic atherosclerosis. Old healed fracture of proximal left humerus incompletely visualized. IMPRESSION: Slight scarring lateral left base. Lungs elsewhere clear. Cardiomegaly noted with pacemaker leads attached to right atrium and right ventricle. No adenopathy. Aortic Atherosclerosis (ICD10-I70.0). Electronically Signed   By: Lowella Grip III M.D.   On: 01/30/2020 16:41    Procedures Procedures (including critical care time)  Medications Ordered in ED Medications - No data to display  ED Course  I have reviewed the triage vital signs and the nursing notes.  Pertinent labs & imaging results that were available during my care of the patient were reviewed by me and considered in my medical decision making  (see chart for details).    MDM Rules/Calculators/A&P                          Elderly female with MMP presenting today with what sounds like a brief syncopal episode at home.  Pt was difficult to arouse for a few minutes but was not herself for about 58min.  Pt reports that now she feels fine.  She is well appearing on exam without acute findings.  Pt's EKG does show afib with intermittent paced rhythm.  CBG wnl.  CMP with cr of 1.65 which not significantly changed from her doctor's report of creatinine of 1.3 in July and CBC with hb of 9.9 from 10.6 in July.  Pt is denying any abdominal pain or vomiting or concern for acute abdominal process at this time.  She has  good distal pulses without evidence of ischemia.  Pacemaker interrogation shows that patient has been in atrial fibrillation since yesterday but there are no acute events around the episode that happened today which was around 12 PM.  CXR reassuring.  Trop of 88 without old to compare but most likely chronic.  Pt was able to get up and go to the bathroom without symptoms and reports she feels fine.  Discussed findings with her daughter Vickii Chafe and did leave a message with Dr. Silvestre Mesi office.  No acute need for admission at this time.  Low suspicion for PE, dissection or AAA.  Low suspicion for GI bleed.  VS have remained normal here and will d/c home to f/u with PCP or return if she has any further events.  MDM Number of Diagnoses or Management Options   Amount and/or Complexity of Data Reviewed Clinical lab tests: ordered and reviewed Tests in the radiology section of CPT: ordered and reviewed Tests in the medicine section of CPT: reviewed and ordered Decide to obtain previous medical records or to obtain history from someone other than the patient: yes Obtain history from someone other than the patient: yes Review and summarize past medical records: yes Discuss the patient with other providers: yes Independent visualization of  images, tracings, or specimens: yes  Risk of Complications, Morbidity, and/or Mortality Presenting problems: high Diagnostic procedures: low Management options: low  Patient Progress Patient progress: improved   Final Clinical Impression(s) / ED Diagnoses Final diagnoses:  Syncope, unspecified syncope type    Rx / DC Orders ED Discharge Orders    None       Blanchie Dessert, MD 01/30/20 1726

## 2020-01-30 NOTE — Telephone Encounter (Signed)
Gigi Gin is calling stating [name] [name] is currently in the process of being admitted into the hospital due to having Afib since this previous conversation. She is requesting a nurse call her back to discuss. Please advise.

## 2020-02-04 NOTE — Progress Notes (Signed)
Remote pacemaker transmission.   

## 2020-02-05 ENCOUNTER — Telehealth: Payer: Self-pay | Admitting: Physician Assistant

## 2020-02-05 NOTE — Telephone Encounter (Signed)
  She had an ER visit after ? unclear syncope, pacer check done in the ER with reported intact function. Eliquis  is appropriately dosed. VSS in the ER and discharged.  Given ER visit i reached out tothe patient.  Spoke with her daughter Margaretha Sheffield she reported the patient today is feeling well, a little sick to her stomach earlier at breakfast but felt well since the morning, was napping after lunch.  Her sister Vickii Chafe, though mentioned that since the ER visit she has had generalized fatigue and some days that have been better, others not so much.  No cough, cold, flu symptoms, no fever.  she is seeing MD Thursday for wound on her leg. Given ER visit with syncope, will have her visit here moved up.  Tommye Standard, PA-C

## 2020-02-07 ENCOUNTER — Ambulatory Visit (INDEPENDENT_AMBULATORY_CARE_PROVIDER_SITE_OTHER): Payer: Medicare Other | Admitting: Physician Assistant

## 2020-02-07 ENCOUNTER — Encounter: Payer: Self-pay | Admitting: Orthopedic Surgery

## 2020-02-07 DIAGNOSIS — L97211 Non-pressure chronic ulcer of right calf limited to breakdown of skin: Secondary | ICD-10-CM

## 2020-02-07 NOTE — Progress Notes (Signed)
Office Visit Note   Patient: [name] [name]           Date of Birth: 1917/12/16           MRN: [id number] Visit Date: 02/07/2020              Requested by: Crist Infante, MD [address] [place],  [place] [address] PCP: Crist Infante, MD  Chief Complaint  Patient presents with  . Right Leg - Wound Check      HPI: Patient follows up for her right posterior calf ulcer.  She has been wearing her vive compression sock.  Assessment & Plan: Visit Diagnoses: No diagnosis found.  Plan: Continue with current compression sock.  Follow-up in 2 weeks sooner if any concerns  Follow-Up Instructions: No follow-ups on file.   Ortho Exam  Patient is alert, oriented, no adenopathy, well-dressed, normal affect, normal respiratory effort. Examination right leg mild soft tissue swelling in her foot.  No swelling in the calf no cellulitis no erythema wound measures about 3 cm in diameter without any adjoining cellulitis and just mild to the erythema.  No foul odor minimal drainage  Imaging: No results found. No images are attached to the encounter.  Labs: Lab Results  Component Value Date   REPTSTATUS 06/08/2016 FINAL 06/03/2016   CULT  06/03/2016    NO GROWTH 5 DAYS Performed at [facility], [place] [address] [place], [place] [address]    LABORGA ESCHERICHIA COLI 06/01/2016     Lab Results  Component Value Date   ALBUMIN 2.8 (L) 01/30/2020   ALBUMIN 3.2 (L) 07/26/2016   ALBUMIN 1.9 (L) 06/06/2016    Lab Results  Component Value Date   MG 1.7 06/05/2016   No results found for: VD25OH  No results found for: PREALBUMIN CBC EXTENDED Latest Ref Rng & Units 01/30/2020 06/07/2016 06/06/2016  WBC 4.0 - 10.5 K/uL 10.6(H) 12.9(H) 14.3(H)  RBC 3.87 - 5.11 MIL/uL 2.97(L) 2.89(L) 2.96(L)  HGB 12.0 - 15.0 g/dL 9.9(L) 9.4(L) 9.6(L)  HCT 36.0 - 46.0 % 31.5(L) 27.2(L) 27.7(L)  PLT 150 - 400 K/uL 243 181 154  NEUTROABS 1.7 - 7.7 K/uL - 9.9(H) 11.3(H)  LYMPHSABS 0.7 - 4.0 K/uL - 1.5 1.5      There is no height or weight on file to calculate BMI.  Orders:  No orders of the defined types were placed in this encounter.  No orders of the defined types were placed in this encounter.    Procedures: No procedures performed  Clinical Data: No additional findings.  ROS:  All other systems negative, except as noted in the HPI. Review of Systems  Objective: Vital Signs: There were no vitals taken for this visit.  Specialty Comments:  No specialty comments available.  PMFS History: Patient Active Problem List   Diagnosis Date Noted  . Abnormal CT of the abdomen   . E coli bacteremia 06/04/2016  . Biliary sepsis 06/04/2016  . Fecal impaction ([place]) 06/04/2016  . Pacemaker   . Nausea & vomiting 05/31/2016  . Biliary obstruction 05/31/2016  . Normocytic anemia 05/31/2016  . Pneumothorax, left 05/17/2014  . Tachy-brady syndrome ([place]) 05/16/2014  . Bradycardia 05/16/2014  . Foot ulcer ([place]) 05/16/2014  . Essential hypertension 05/16/2014  . Atrial flutter ([place]) 05/16/2014  . Sinus node dysfunction - s/p St. Jude (serial number [id number]) PPM   . Atrial fibrillation ([place]) 05/15/2014   Past Medical History:  Diagnosis Date  . Allergic rhinitis   . Anemia   .  Atrial fibrillation ([place])    a. on Eliquis  . Biliary obstruction   . Chronic kidney disease (CKD), stage II (mild)   . DDD (degenerative disc disease), lumbar   . Diabetes ([place])   . DJD (degenerative joint disease)   . GERD (gastroesophageal reflux disease)   . Hyperlipidemia   . Hypertension   . Macular degeneration   . Osteoporosis   . Stomach cancer ([place])    1992  . Tachy-brady syndrome ([facility])    a. s/p STJ dual chamber PPM implant  . Vitamin B 12 deficiency   . Vitamin D deficiency   . Whooping cough     Family History  Problem Relation Age of Onset  . Heart disease Father        Dropsy  . Pneumonia Mother   . Sudden death Daughter 1  . Heart attack Son 56  . Colon cancer Neg Hx   .  Esophageal cancer Neg Hx   . Pancreatic cancer Neg Hx   . Stomach cancer Neg Hx     Past Surgical History:  Procedure Laterality Date  . BACK SURGERY    . CHOLECYSTECTOMY    . IR EXCHANGE BILIARY DRAIN  06/22/2016  . IR EXCHANGE BILIARY DRAIN  07/30/2016  . IR EXCHANGE BILIARY DRAIN  10/01/2016  . IR EXCHANGE BILIARY DRAIN  12/02/2016  . IR EXCHANGE BILIARY DRAIN  12/24/2016  . IR EXCHANGE BILIARY DRAIN  03/17/2017  . IR EXCHANGE BILIARY DRAIN  06/09/2017  . IR EXCHANGE BILIARY DRAIN  09/09/2017  . IR EXCHANGE BILIARY DRAIN  12/02/2017  . IR EXCHANGE BILIARY DRAIN  02/24/2018  . IR EXCHANGE BILIARY DRAIN  06/02/2018  . IR EXCHANGE BILIARY DRAIN  08/25/2018  . IR EXCHANGE BILIARY DRAIN  11/21/2018  . IR EXCHANGE BILIARY DRAIN  02/13/2019  . IR EXCHANGE BILIARY DRAIN  05/08/2019  . IR EXCHANGE BILIARY DRAIN  07/31/2019  . IR EXCHANGE BILIARY DRAIN  10/23/2019  . IR EXCHANGE BILIARY DRAIN  01/15/2020  . IR INT EXT BILIARY DRAIN WITH CHOLANGIOGRAM  06/04/2016  . PARTIAL GASTRECTOMY  1992  . PARTIAL HYSTERECTOMY  1968  . PERMANENT PACEMAKER INSERTION N/A 05/16/2014   STJ Assurity dual chamber pacemaker implanted by Dr Lovena Le  . TOTAL ABDOMINAL HYSTERECTOMY  1991   Social History   Occupational History  . Not on file  Tobacco Use  . Smoking status: Never Smoker  . Smokeless tobacco: Never Used  Vaping Use  . Vaping Use: Never used  Substance and Sexual Activity  . Alcohol use: No  . Drug use: No  . Sexual activity: Not on file

## 2020-02-13 ENCOUNTER — Encounter: Payer: Medicare Other | Admitting: Student

## 2020-02-14 ENCOUNTER — Other Ambulatory Visit: Payer: Self-pay

## 2020-02-14 ENCOUNTER — Ambulatory Visit (INDEPENDENT_AMBULATORY_CARE_PROVIDER_SITE_OTHER): Payer: Medicare Other | Admitting: Internal Medicine

## 2020-02-14 ENCOUNTER — Encounter: Payer: Self-pay | Admitting: Internal Medicine

## 2020-02-14 VITALS — BP 138/56 | HR 45 | Ht 64.0 in | Wt 112.4 lb

## 2020-02-14 DIAGNOSIS — I495 Sick sinus syndrome: Secondary | ICD-10-CM | POA: Diagnosis not present

## 2020-02-14 DIAGNOSIS — I10 Essential (primary) hypertension: Secondary | ICD-10-CM | POA: Diagnosis not present

## 2020-02-14 DIAGNOSIS — I480 Paroxysmal atrial fibrillation: Secondary | ICD-10-CM | POA: Diagnosis not present

## 2020-02-14 DIAGNOSIS — Z950 Presence of cardiac pacemaker: Secondary | ICD-10-CM | POA: Diagnosis not present

## 2020-02-14 MED FILL — NORMAL SALINE FLUSH SYRINGE: 0.9 | 30 days supply | Qty: 300 | Fill #0

## 2020-02-14 NOTE — Progress Notes (Signed)
HPI [name] [name] returns today for followup. She is a pleasant 87yo woman with symptomatic bradycardia, PAF, HTN, who underwent PPM insertion4years ago. She returns today for followup. She has no chest pain or sob. No syncope. She hasno specific complaints today.No fever or chills. Her appetite is good. She had a near syncopal episode last week. Only thing that was seen was trigeminy.  No Known Allergies   Current Outpatient Medications  Medication Sig Dispense Refill  . acetaminophen (TYLENOL) 500 MG tablet Take 500 mg by mouth every 6 (six) hours as needed for mild pain.    Marland Kitchen atorvastatin (LIPITOR) 20 MG tablet TAKE 1 TABLET (20 MG TOTAL) BY MOUTH DAILY AT 6 PM. 90 tablet 3  . Calcium Carbonate-Vitamin D (CALCIUM 600/VITAMIN D PO) Take 2 tablets by mouth daily.    . Cyanocobalamin (NASCOBAL NA) Place 1 spray into both nostrils once a week. saturday    . denosumab (PROLIA) 60 MG/ML SOLN injection Inject 60 mg into the skin every 6 (six) months. Administer in upper arm, thigh, or abdomen    . diltiazem (CARDIZEM CD) 120 MG 24 hr capsule TAKE 1 CAPSULE BY MOUTH EVERY DAY 90 capsule 3  . doxycycline (VIBRA-TABS) 100 MG tablet Take 1 tablet (100 mg total) by mouth 2 (two) times daily. 20 tablet 0  . ELIQUIS 2.5 MG TABS tablet TAKE 1 TABLET BY MOUTH TWICE A DAY 60 tablet 4  . Ergocalciferol (VITAMIN D2 PO) Take 1.25 mg by mouth once a week.    . ferrous sulfate 325 (65 FE) MG tablet Take 325 mg by mouth daily with breakfast.    . Fexofenadine HCl (MUCINEX ALLERGY PO) Take by mouth.    . hydrochlorothiazide (HYDRODIURIL) 25 MG tablet Take 12.5 mg by mouth daily.    Marland Kitchen levothyroxine (SYNTHROID) 25 MCG tablet Take 25 mcg by mouth every morning.    . loratadine (CLARITIN) 10 MG tablet Take 10 mg by mouth daily.    Marland Kitchen LORazepam (ATIVAN) 1 MG tablet Take 1 mg by mouth at bedtime.    . metoprolol (LOPRESSOR) 50 MG tablet Take 50 mg by mouth 2 (two) times daily.    . mometasone (NASONEX) 50  MCG/ACT nasal spray Place 2 sprays into both nostrils daily.    . Multiple Vitamins-Minerals (MULTIVITAMIN WITH MINERALS) tablet Take 1 tablet by mouth daily.    . Omega-3 Fatty Acids (FISH OIL PO) Take 12 mg by mouth daily.    Vladimir Faster Glycol-Propyl Glycol 0.4-0.3 % SOLN Apply 1 drop to eye daily as needed (dryness).    . sucralfate (CARAFATE) 1 G tablet Take 1 g by mouth 4 (four) times daily -  before meals and at bedtime.    . traMADol (ULTRAM) 50 MG tablet Take 50 mg by mouth 2 (two) times daily.     No current facility-administered medications for this visit.     Past Medical History:  Diagnosis Date  . Allergic rhinitis   . Anemia   . Atrial fibrillation ([place])    a. on Eliquis  . Biliary obstruction   . Chronic kidney disease (CKD), stage II (mild)   . DDD (degenerative disc disease), lumbar   . Diabetes ([place])   . DJD (degenerative joint disease)   . GERD (gastroesophageal reflux disease)   . Hyperlipidemia   . Hypertension   . Macular degeneration   . Osteoporosis   . Stomach cancer ([place])    1992  . Tachy-brady syndrome ([place])  a. s/p STJ dual chamber PPM implant  . Vitamin B 12 deficiency   . Vitamin D deficiency   . Whooping cough     ROS:   All systems reviewed and negative except as noted in the HPI.   Past Surgical History:  Procedure Laterality Date  . BACK SURGERY    . CHOLECYSTECTOMY    . IR EXCHANGE BILIARY DRAIN  06/22/2016  . IR EXCHANGE BILIARY DRAIN  07/30/2016  . IR EXCHANGE BILIARY DRAIN  10/01/2016  . IR EXCHANGE BILIARY DRAIN  12/02/2016  . IR EXCHANGE BILIARY DRAIN  12/24/2016  . IR EXCHANGE BILIARY DRAIN  03/17/2017  . IR EXCHANGE BILIARY DRAIN  06/09/2017  . IR EXCHANGE BILIARY DRAIN  09/09/2017  . IR EXCHANGE BILIARY DRAIN  12/02/2017  . IR EXCHANGE BILIARY DRAIN  02/24/2018  . IR EXCHANGE BILIARY DRAIN  06/02/2018  . IR EXCHANGE BILIARY DRAIN  08/25/2018  . IR EXCHANGE BILIARY DRAIN  11/21/2018  . IR EXCHANGE BILIARY DRAIN  02/13/2019  . IR  EXCHANGE BILIARY DRAIN  05/08/2019  . IR EXCHANGE BILIARY DRAIN  07/31/2019  . IR EXCHANGE BILIARY DRAIN  10/23/2019  . IR EXCHANGE BILIARY DRAIN  01/15/2020  . IR INT EXT BILIARY DRAIN WITH CHOLANGIOGRAM  06/04/2016  . PARTIAL GASTRECTOMY  1992  . PARTIAL HYSTERECTOMY  1968  . PERMANENT PACEMAKER INSERTION N/A 05/16/2014   STJ Assurity dual chamber pacemaker implanted by Dr Lovena Le  . TOTAL ABDOMINAL HYSTERECTOMY  1991     Family History  Problem Relation Age of Onset  . Heart disease Father        Dropsy  . Pneumonia Mother   . Sudden death Daughter 84  . Heart attack Son 68  . Colon cancer Neg Hx   . Esophageal cancer Neg Hx   . Pancreatic cancer Neg Hx   . Stomach cancer Neg Hx      Social History   Socioeconomic History  . Marital status: Married    Spouse name: Not on file  . Number of children: 4  . Years of education: Not on file  . Highest education level: Not on file  Occupational History  . Not on file  Tobacco Use  . Smoking status: Never Smoker  . Smokeless tobacco: Never Used  Vaping Use  . Vaping Use: Never used  Substance and Sexual Activity  . Alcohol use: No  . Drug use: No  . Sexual activity: Not on file  Other Topics Concern  . Not on file  Social History Narrative   Lives alone.     Social Determinants of Health   Financial Resource Strain: Not on file  Food Insecurity: Not on file  Transportation Needs: Not on file  Physical Activity: Not on file  Stress: Not on file  Social Connections: Not on file  Intimate Partner Violence: Not on file     BP (!) 138/56   Pulse (!) 45   Ht 5\' 4"  (1.626 m)   Wt 112 lb 6.4 oz (51 kg)   SpO2 95%   BMI 19.29 kg/m   Physical Exam:  Well appearing elderly woman, NAD HEENT: Unremarkable Neck:  6 cm JVD, no thyromegally Lymphatics:  No adenopathy Back:  No CVA tenderness Lungs:  Clear HEART:  Regular rate rhythm, no murmurs, no rubs, no clicks Abd:  soft, positive bowel sounds, no  organomegally, no rebound, no guarding Ext:  2 plus pulses, no edema, no cyanosis, no clubbing Skin:  No rashes  no nodules Neuro:  CN II through XII intact, motor grossly intact  DEVICE  Normal device function.  See PaceArt for details.   Assess/Plan: 1. Sinus node dysfunction - she is asymptomatic, s/p PPM insertion. 2. HTN - her bp is up a bit today but better than previous visits. I encouraged her to avoid salty foods.  3. PPM - her St. Jude DDD PM is working normally. We will recheck in several months.   Carleene Overlie Wilferd Ritson,MD

## 2020-02-14 NOTE — Patient Instructions (Signed)
Medication Instructions:  Your physician recommends that you continue on your current medications as directed. Please refer to the Current Medication list given to you today.  Labwork: None ordered.  Testing/Procedures: None ordered.  Follow-Up: Your physician wants you to follow-up in: one year with Cristopher Peru, MD or one of the following Advanced Practice Providers on your designated Care Team:    Chanetta Marshall, NP  Tommye Standard, PA-C  Legrand Como "Jonni Sanger" [place], [place]  Remote monitoring is used to monitor your Pacemaker from home. This monitoring reduces the number of office visits required to check your device to one time per year. It allows [place] to keep an eye on the functioning of your device to ensure it is working properly. You are scheduled for a device check from home on 04/21/2020. You may send your transmission at any time that day. If you have a wireless device, the transmission will be sent automatically. After your physician reviews your transmission, you will receive a postcard with your next transmission date.  Any Other Special Instructions Will Be Listed Below (If Applicable).  If you need a refill on your cardiac medications before your next appointment, please call your pharmacy.

## 2020-02-25 ENCOUNTER — Ambulatory Visit (INDEPENDENT_AMBULATORY_CARE_PROVIDER_SITE_OTHER): Payer: Medicare Other | Admitting: Physician Assistant

## 2020-02-25 ENCOUNTER — Encounter: Payer: Self-pay | Admitting: Physician Assistant

## 2020-02-25 DIAGNOSIS — L97501 Non-pressure chronic ulcer of other part of unspecified foot limited to breakdown of skin: Secondary | ICD-10-CM | POA: Diagnosis not present

## 2020-02-25 NOTE — Progress Notes (Signed)
Office Visit Note   Patient: [name] [name]           Date of Birth: [birth year]           MRN: [id number] Visit Date: 02/25/2020              Requested by: Crist Infante, MD [address] [place],  [place] [address] PCP: Crist Infante, MD  No chief complaint on file.     HPI: Patient presents today for follow-up on her right lower extremity.  She has a history of a posterior calf ulcer.  Also has an ulcer at the anterior ankle.  Her daughter says she has had this before.  She is wearing compression socks which are changed daily  Assessment & Plan: Visit Diagnoses: No diagnosis found.  Plan: Continue with current socks will follow up in 2 weeks.  Follow-Up Instructions: No follow-ups on file.   Ortho Exam  Patient is alert, oriented, no adenopathy, well-dressed, normal affect, normal respiratory effort. Posterior calf ulcer measures approximately 2-1/2 cm in diameter there is no foul odor minimal drainage she does have some eschar no cellulitis.  She also has an ulcer on the anterior ankle.  This is also has some eschar to it there is no foul odor no surrounding cellulitis no swelling.  No signs of infection  Imaging: No results found. No images are attached to the encounter.  Labs: Lab Results  Component Value Date   REPTSTATUS 06/08/2016 FINAL 06/03/2016   CULT  06/03/2016    NO GROWTH 5 DAYS Performed at [facility], [place] [address], [place], [place] [address]    LABORGA ESCHERICHIA COLI 06/01/2016     Lab Results  Component Value Date   ALBUMIN 2.8 (L) 01/30/2020   ALBUMIN 3.2 (L) 07/26/2016   ALBUMIN 1.9 (L) 06/06/2016    Lab Results  Component Value Date   MG 1.7 06/05/2016   No results found for: VD25OH  No results found for: PREALBUMIN CBC EXTENDED Latest Ref Rng & Units 01/30/2020 06/07/2016 06/06/2016  WBC 4.0 - 10.5 K/uL 10.6(H) 12.9(H) 14.3(H)  RBC 3.87 - 5.11 MIL/uL 2.97(L) 2.89(L) 2.96(L)  HGB 12.0 - 15.0 g/dL 9.9(L) 9.4(L) 9.6(L)  HCT  36.0 - 46.0 % 31.5(L) 27.2(L) 27.7(L)  PLT 150 - 400 K/uL 243 181 154  NEUTROABS 1.7 - 7.7 K/uL - 9.9(H) 11.3(H)  LYMPHSABS 0.7 - 4.0 K/uL - 1.5 1.5     There is no height or weight on file to calculate BMI.  Orders:  No orders of the defined types were placed in this encounter.  No orders of the defined types were placed in this encounter.    Procedures: No procedures performed  Clinical Data: No additional findings.  ROS:  All other systems negative, except as noted in the HPI. Review of Systems  Objective: Vital Signs: There were no vitals taken for this visit.  Specialty Comments:  No specialty comments available.  PMFS History: Patient Active Problem List   Diagnosis Date Noted  . Abnormal CT of the abdomen   . E coli bacteremia 06/04/2016  . Biliary sepsis 06/04/2016  . Fecal impaction ([place]) 06/04/2016  . Pacemaker   . Nausea & vomiting 05/31/2016  . Biliary obstruction 05/31/2016  . Normocytic anemia 05/31/2016  . Pneumothorax, left 05/17/2014  . Tachy-brady syndrome ([place]) 05/16/2014  . Bradycardia 05/16/2014  . Foot ulcer ([place]) 05/16/2014  . Essential hypertension 05/16/2014  . Atrial flutter ([place]) 05/16/2014  . Sinus node dysfunction -  s/p St. Jude (serial number [id number]) PPM   . Atrial fibrillation ([place]) 05/15/2014   Past Medical History:  Diagnosis Date  . Allergic rhinitis   . Anemia   . Atrial fibrillation ([place])    a. on Eliquis  . Biliary obstruction   . Chronic kidney disease (CKD), stage II (mild)   . DDD (degenerative disc disease), lumbar   . Diabetes ([place])   . DJD (degenerative joint disease)   . GERD (gastroesophageal reflux disease)   . Hyperlipidemia   . Hypertension   . Macular degeneration   . Osteoporosis   . Stomach cancer ([place])    1992  . Tachy-brady syndrome ([facility])    a. s/p STJ dual chamber PPM implant  . Vitamin B 12 deficiency   . Vitamin D deficiency   . Whooping cough     Family History  Problem Relation Age  of Onset  . Heart disease Father        Dropsy  . Pneumonia Mother   . Sudden death Daughter 68  . Heart attack Son 19  . Colon cancer Neg Hx   . Esophageal cancer Neg Hx   . Pancreatic cancer Neg Hx   . Stomach cancer Neg Hx     Past Surgical History:  Procedure Laterality Date  . BACK SURGERY    . CHOLECYSTECTOMY    . IR EXCHANGE BILIARY DRAIN  06/22/2016  . IR EXCHANGE BILIARY DRAIN  07/30/2016  . IR EXCHANGE BILIARY DRAIN  10/01/2016  . IR EXCHANGE BILIARY DRAIN  12/02/2016  . IR EXCHANGE BILIARY DRAIN  12/24/2016  . IR EXCHANGE BILIARY DRAIN  03/17/2017  . IR EXCHANGE BILIARY DRAIN  06/09/2017  . IR EXCHANGE BILIARY DRAIN  09/09/2017  . IR EXCHANGE BILIARY DRAIN  12/02/2017  . IR EXCHANGE BILIARY DRAIN  02/24/2018  . IR EXCHANGE BILIARY DRAIN  06/02/2018  . IR EXCHANGE BILIARY DRAIN  08/25/2018  . IR EXCHANGE BILIARY DRAIN  11/21/2018  . IR EXCHANGE BILIARY DRAIN  02/13/2019  . IR EXCHANGE BILIARY DRAIN  05/08/2019  . IR EXCHANGE BILIARY DRAIN  07/31/2019  . IR EXCHANGE BILIARY DRAIN  10/23/2019  . IR EXCHANGE BILIARY DRAIN  01/15/2020  . IR INT EXT BILIARY DRAIN WITH CHOLANGIOGRAM  06/04/2016  . PARTIAL GASTRECTOMY  1992  . PARTIAL HYSTERECTOMY  1968  . PERMANENT PACEMAKER INSERTION N/A 05/16/2014   STJ Assurity dual chamber pacemaker implanted by Dr Lovena Le  . TOTAL ABDOMINAL HYSTERECTOMY  1991   Social History   Occupational History  . Not on file  Tobacco Use  . Smoking status: Never Smoker  . Smokeless tobacco: Never Used  Vaping Use  . Vaping Use: Never used  Substance and Sexual Activity  . Alcohol use: No  . Drug use: No  . Sexual activity: Not on file

## 2020-03-07 DIAGNOSIS — R32 Unspecified urinary incontinence: Secondary | ICD-10-CM | POA: Diagnosis not present

## 2020-03-07 DIAGNOSIS — Z9689 Presence of other specified functional implants: Secondary | ICD-10-CM | POA: Diagnosis not present

## 2020-03-07 DIAGNOSIS — Z515 Encounter for palliative care: Secondary | ICD-10-CM | POA: Diagnosis not present

## 2020-03-07 DIAGNOSIS — N183 Chronic kidney disease, stage 3 unspecified: Secondary | ICD-10-CM | POA: Diagnosis not present

## 2020-03-07 DIAGNOSIS — M6284 Sarcopenia: Secondary | ICD-10-CM | POA: Diagnosis not present

## 2020-03-07 DIAGNOSIS — R159 Full incontinence of feces: Secondary | ICD-10-CM | POA: Diagnosis not present

## 2020-03-07 DIAGNOSIS — E119 Type 2 diabetes mellitus without complications: Secondary | ICD-10-CM | POA: Diagnosis not present

## 2020-03-07 DIAGNOSIS — I87319 Chronic venous hypertension (idiopathic) with ulcer of unspecified lower extremity: Secondary | ICD-10-CM | POA: Diagnosis not present

## 2020-03-07 DIAGNOSIS — K219 Gastro-esophageal reflux disease without esophagitis: Secondary | ICD-10-CM | POA: Diagnosis not present

## 2020-03-08 DIAGNOSIS — N183 Chronic kidney disease, stage 3 unspecified: Secondary | ICD-10-CM | POA: Diagnosis not present

## 2020-03-08 DIAGNOSIS — E119 Type 2 diabetes mellitus without complications: Secondary | ICD-10-CM | POA: Diagnosis not present

## 2020-03-08 DIAGNOSIS — M6284 Sarcopenia: Secondary | ICD-10-CM | POA: Diagnosis not present

## 2020-03-08 DIAGNOSIS — R159 Full incontinence of feces: Secondary | ICD-10-CM | POA: Diagnosis not present

## 2020-03-08 DIAGNOSIS — K219 Gastro-esophageal reflux disease without esophagitis: Secondary | ICD-10-CM | POA: Diagnosis not present

## 2020-03-08 DIAGNOSIS — I87319 Chronic venous hypertension (idiopathic) with ulcer of unspecified lower extremity: Secondary | ICD-10-CM | POA: Diagnosis not present

## 2020-03-10 ENCOUNTER — Ambulatory Visit: Payer: Medicare Other | Admitting: Physician Assistant

## 2020-03-10 DIAGNOSIS — R159 Full incontinence of feces: Secondary | ICD-10-CM | POA: Diagnosis not present

## 2020-03-10 DIAGNOSIS — K219 Gastro-esophageal reflux disease without esophagitis: Secondary | ICD-10-CM | POA: Diagnosis not present

## 2020-03-10 DIAGNOSIS — I87319 Chronic venous hypertension (idiopathic) with ulcer of unspecified lower extremity: Secondary | ICD-10-CM | POA: Diagnosis not present

## 2020-03-10 DIAGNOSIS — E119 Type 2 diabetes mellitus without complications: Secondary | ICD-10-CM | POA: Diagnosis not present

## 2020-03-10 DIAGNOSIS — N183 Chronic kidney disease, stage 3 unspecified: Secondary | ICD-10-CM | POA: Diagnosis not present

## 2020-03-10 DIAGNOSIS — M6284 Sarcopenia: Secondary | ICD-10-CM | POA: Diagnosis not present

## 2020-03-13 DIAGNOSIS — K219 Gastro-esophageal reflux disease without esophagitis: Secondary | ICD-10-CM | POA: Diagnosis not present

## 2020-03-13 DIAGNOSIS — I87319 Chronic venous hypertension (idiopathic) with ulcer of unspecified lower extremity: Secondary | ICD-10-CM | POA: Diagnosis not present

## 2020-03-13 DIAGNOSIS — R159 Full incontinence of feces: Secondary | ICD-10-CM | POA: Diagnosis not present

## 2020-03-13 DIAGNOSIS — M6284 Sarcopenia: Secondary | ICD-10-CM | POA: Diagnosis not present

## 2020-03-13 DIAGNOSIS — N183 Chronic kidney disease, stage 3 unspecified: Secondary | ICD-10-CM | POA: Diagnosis not present

## 2020-03-13 DIAGNOSIS — E119 Type 2 diabetes mellitus without complications: Secondary | ICD-10-CM | POA: Diagnosis not present

## 2020-03-16 DIAGNOSIS — K219 Gastro-esophageal reflux disease without esophagitis: Secondary | ICD-10-CM | POA: Diagnosis not present

## 2020-03-16 DIAGNOSIS — R159 Full incontinence of feces: Secondary | ICD-10-CM | POA: Diagnosis not present

## 2020-03-16 DIAGNOSIS — N183 Chronic kidney disease, stage 3 unspecified: Secondary | ICD-10-CM | POA: Diagnosis not present

## 2020-03-16 DIAGNOSIS — M6284 Sarcopenia: Secondary | ICD-10-CM | POA: Diagnosis not present

## 2020-03-16 DIAGNOSIS — I87319 Chronic venous hypertension (idiopathic) with ulcer of unspecified lower extremity: Secondary | ICD-10-CM | POA: Diagnosis not present

## 2020-03-16 DIAGNOSIS — E119 Type 2 diabetes mellitus without complications: Secondary | ICD-10-CM | POA: Diagnosis not present

## 2020-03-17 DIAGNOSIS — K219 Gastro-esophageal reflux disease without esophagitis: Secondary | ICD-10-CM | POA: Diagnosis not present

## 2020-03-17 DIAGNOSIS — I87319 Chronic venous hypertension (idiopathic) with ulcer of unspecified lower extremity: Secondary | ICD-10-CM | POA: Diagnosis not present

## 2020-03-17 DIAGNOSIS — E119 Type 2 diabetes mellitus without complications: Secondary | ICD-10-CM | POA: Diagnosis not present

## 2020-03-17 DIAGNOSIS — R159 Full incontinence of feces: Secondary | ICD-10-CM | POA: Diagnosis not present

## 2020-03-17 DIAGNOSIS — M6284 Sarcopenia: Secondary | ICD-10-CM | POA: Diagnosis not present

## 2020-03-17 DIAGNOSIS — N183 Chronic kidney disease, stage 3 unspecified: Secondary | ICD-10-CM | POA: Diagnosis not present

## 2020-03-18 ENCOUNTER — Encounter: Payer: Medicare Other | Admitting: Internal Medicine

## 2020-03-18 DIAGNOSIS — I87319 Chronic venous hypertension (idiopathic) with ulcer of unspecified lower extremity: Secondary | ICD-10-CM | POA: Diagnosis not present

## 2020-03-18 DIAGNOSIS — M6284 Sarcopenia: Secondary | ICD-10-CM | POA: Diagnosis not present

## 2020-03-18 DIAGNOSIS — N183 Chronic kidney disease, stage 3 unspecified: Secondary | ICD-10-CM | POA: Diagnosis not present

## 2020-03-18 DIAGNOSIS — K219 Gastro-esophageal reflux disease without esophagitis: Secondary | ICD-10-CM | POA: Diagnosis not present

## 2020-03-18 DIAGNOSIS — R159 Full incontinence of feces: Secondary | ICD-10-CM | POA: Diagnosis not present

## 2020-03-18 DIAGNOSIS — E119 Type 2 diabetes mellitus without complications: Secondary | ICD-10-CM | POA: Diagnosis not present

## 2020-03-20 DIAGNOSIS — N183 Chronic kidney disease, stage 3 unspecified: Secondary | ICD-10-CM | POA: Diagnosis not present

## 2020-03-20 DIAGNOSIS — E119 Type 2 diabetes mellitus without complications: Secondary | ICD-10-CM | POA: Diagnosis not present

## 2020-03-20 DIAGNOSIS — I87319 Chronic venous hypertension (idiopathic) with ulcer of unspecified lower extremity: Secondary | ICD-10-CM | POA: Diagnosis not present

## 2020-03-20 DIAGNOSIS — K219 Gastro-esophageal reflux disease without esophagitis: Secondary | ICD-10-CM | POA: Diagnosis not present

## 2020-03-20 DIAGNOSIS — M6284 Sarcopenia: Secondary | ICD-10-CM | POA: Diagnosis not present

## 2020-03-20 DIAGNOSIS — R159 Full incontinence of feces: Secondary | ICD-10-CM | POA: Diagnosis not present

## 2020-03-21 DIAGNOSIS — E119 Type 2 diabetes mellitus without complications: Secondary | ICD-10-CM | POA: Diagnosis not present

## 2020-03-21 DIAGNOSIS — M6284 Sarcopenia: Secondary | ICD-10-CM | POA: Diagnosis not present

## 2020-03-21 DIAGNOSIS — I87319 Chronic venous hypertension (idiopathic) with ulcer of unspecified lower extremity: Secondary | ICD-10-CM | POA: Diagnosis not present

## 2020-03-21 DIAGNOSIS — R159 Full incontinence of feces: Secondary | ICD-10-CM | POA: Diagnosis not present

## 2020-03-21 DIAGNOSIS — K219 Gastro-esophageal reflux disease without esophagitis: Secondary | ICD-10-CM | POA: Diagnosis not present

## 2020-03-21 DIAGNOSIS — N183 Chronic kidney disease, stage 3 unspecified: Secondary | ICD-10-CM | POA: Diagnosis not present

## 2020-03-25 DIAGNOSIS — Z9689 Presence of other specified functional implants: Secondary | ICD-10-CM | POA: Diagnosis not present

## 2020-03-25 DIAGNOSIS — K219 Gastro-esophageal reflux disease without esophagitis: Secondary | ICD-10-CM | POA: Diagnosis not present

## 2020-03-25 DIAGNOSIS — Z515 Encounter for palliative care: Secondary | ICD-10-CM | POA: Diagnosis not present

## 2020-03-25 DIAGNOSIS — M6284 Sarcopenia: Secondary | ICD-10-CM | POA: Diagnosis not present

## 2020-03-25 DIAGNOSIS — E119 Type 2 diabetes mellitus without complications: Secondary | ICD-10-CM | POA: Diagnosis not present

## 2020-03-25 DIAGNOSIS — R159 Full incontinence of feces: Secondary | ICD-10-CM | POA: Diagnosis not present

## 2020-03-25 DIAGNOSIS — I87319 Chronic venous hypertension (idiopathic) with ulcer of unspecified lower extremity: Secondary | ICD-10-CM | POA: Diagnosis not present

## 2020-03-25 DIAGNOSIS — R32 Unspecified urinary incontinence: Secondary | ICD-10-CM | POA: Diagnosis not present

## 2020-03-25 DIAGNOSIS — N183 Chronic kidney disease, stage 3 unspecified: Secondary | ICD-10-CM | POA: Diagnosis not present

## 2020-03-26 DIAGNOSIS — N183 Chronic kidney disease, stage 3 unspecified: Secondary | ICD-10-CM | POA: Diagnosis not present

## 2020-03-26 DIAGNOSIS — K219 Gastro-esophageal reflux disease without esophagitis: Secondary | ICD-10-CM | POA: Diagnosis not present

## 2020-03-26 DIAGNOSIS — M6284 Sarcopenia: Secondary | ICD-10-CM | POA: Diagnosis not present

## 2020-03-26 DIAGNOSIS — R159 Full incontinence of feces: Secondary | ICD-10-CM | POA: Diagnosis not present

## 2020-03-26 DIAGNOSIS — I87319 Chronic venous hypertension (idiopathic) with ulcer of unspecified lower extremity: Secondary | ICD-10-CM | POA: Diagnosis not present

## 2020-03-26 DIAGNOSIS — E119 Type 2 diabetes mellitus without complications: Secondary | ICD-10-CM | POA: Diagnosis not present

## 2020-03-27 DIAGNOSIS — M6284 Sarcopenia: Secondary | ICD-10-CM | POA: Diagnosis not present

## 2020-03-27 DIAGNOSIS — K219 Gastro-esophageal reflux disease without esophagitis: Secondary | ICD-10-CM | POA: Diagnosis not present

## 2020-03-27 DIAGNOSIS — I87319 Chronic venous hypertension (idiopathic) with ulcer of unspecified lower extremity: Secondary | ICD-10-CM | POA: Diagnosis not present

## 2020-03-27 DIAGNOSIS — E119 Type 2 diabetes mellitus without complications: Secondary | ICD-10-CM | POA: Diagnosis not present

## 2020-03-27 DIAGNOSIS — R159 Full incontinence of feces: Secondary | ICD-10-CM | POA: Diagnosis not present

## 2020-03-27 DIAGNOSIS — N183 Chronic kidney disease, stage 3 unspecified: Secondary | ICD-10-CM | POA: Diagnosis not present

## 2020-03-29 DIAGNOSIS — R159 Full incontinence of feces: Secondary | ICD-10-CM | POA: Diagnosis not present

## 2020-03-29 DIAGNOSIS — K219 Gastro-esophageal reflux disease without esophagitis: Secondary | ICD-10-CM | POA: Diagnosis not present

## 2020-03-29 DIAGNOSIS — M6284 Sarcopenia: Secondary | ICD-10-CM | POA: Diagnosis not present

## 2020-03-29 DIAGNOSIS — N183 Chronic kidney disease, stage 3 unspecified: Secondary | ICD-10-CM | POA: Diagnosis not present

## 2020-03-29 DIAGNOSIS — I87319 Chronic venous hypertension (idiopathic) with ulcer of unspecified lower extremity: Secondary | ICD-10-CM | POA: Diagnosis not present

## 2020-03-29 DIAGNOSIS — E119 Type 2 diabetes mellitus without complications: Secondary | ICD-10-CM | POA: Diagnosis not present

## 2020-04-01 DIAGNOSIS — R159 Full incontinence of feces: Secondary | ICD-10-CM | POA: Diagnosis not present

## 2020-04-01 DIAGNOSIS — N183 Chronic kidney disease, stage 3 unspecified: Secondary | ICD-10-CM | POA: Diagnosis not present

## 2020-04-01 DIAGNOSIS — I87319 Chronic venous hypertension (idiopathic) with ulcer of unspecified lower extremity: Secondary | ICD-10-CM | POA: Diagnosis not present

## 2020-04-01 DIAGNOSIS — K219 Gastro-esophageal reflux disease without esophagitis: Secondary | ICD-10-CM | POA: Diagnosis not present

## 2020-04-01 DIAGNOSIS — E119 Type 2 diabetes mellitus without complications: Secondary | ICD-10-CM | POA: Diagnosis not present

## 2020-04-01 DIAGNOSIS — M6284 Sarcopenia: Secondary | ICD-10-CM | POA: Diagnosis not present

## 2020-04-02 DIAGNOSIS — E119 Type 2 diabetes mellitus without complications: Secondary | ICD-10-CM | POA: Diagnosis not present

## 2020-04-02 DIAGNOSIS — N183 Chronic kidney disease, stage 3 unspecified: Secondary | ICD-10-CM | POA: Diagnosis not present

## 2020-04-02 DIAGNOSIS — R159 Full incontinence of feces: Secondary | ICD-10-CM | POA: Diagnosis not present

## 2020-04-02 DIAGNOSIS — K219 Gastro-esophageal reflux disease without esophagitis: Secondary | ICD-10-CM | POA: Diagnosis not present

## 2020-04-02 DIAGNOSIS — I87319 Chronic venous hypertension (idiopathic) with ulcer of unspecified lower extremity: Secondary | ICD-10-CM | POA: Diagnosis not present

## 2020-04-02 DIAGNOSIS — M6284 Sarcopenia: Secondary | ICD-10-CM | POA: Diagnosis not present

## 2020-04-03 DIAGNOSIS — K219 Gastro-esophageal reflux disease without esophagitis: Secondary | ICD-10-CM | POA: Diagnosis not present

## 2020-04-03 DIAGNOSIS — E119 Type 2 diabetes mellitus without complications: Secondary | ICD-10-CM | POA: Diagnosis not present

## 2020-04-03 DIAGNOSIS — M6284 Sarcopenia: Secondary | ICD-10-CM | POA: Diagnosis not present

## 2020-04-03 DIAGNOSIS — I87319 Chronic venous hypertension (idiopathic) with ulcer of unspecified lower extremity: Secondary | ICD-10-CM | POA: Diagnosis not present

## 2020-04-03 DIAGNOSIS — R159 Full incontinence of feces: Secondary | ICD-10-CM | POA: Diagnosis not present

## 2020-04-03 DIAGNOSIS — N183 Chronic kidney disease, stage 3 unspecified: Secondary | ICD-10-CM | POA: Diagnosis not present

## 2020-04-04 DIAGNOSIS — R159 Full incontinence of feces: Secondary | ICD-10-CM | POA: Diagnosis not present

## 2020-04-04 DIAGNOSIS — E119 Type 2 diabetes mellitus without complications: Secondary | ICD-10-CM | POA: Diagnosis not present

## 2020-04-04 DIAGNOSIS — M6284 Sarcopenia: Secondary | ICD-10-CM | POA: Diagnosis not present

## 2020-04-04 DIAGNOSIS — N183 Chronic kidney disease, stage 3 unspecified: Secondary | ICD-10-CM | POA: Diagnosis not present

## 2020-04-04 DIAGNOSIS — K219 Gastro-esophageal reflux disease without esophagitis: Secondary | ICD-10-CM | POA: Diagnosis not present

## 2020-04-04 DIAGNOSIS — I87319 Chronic venous hypertension (idiopathic) with ulcer of unspecified lower extremity: Secondary | ICD-10-CM | POA: Diagnosis not present

## 2020-04-07 DIAGNOSIS — M6284 Sarcopenia: Secondary | ICD-10-CM | POA: Diagnosis not present

## 2020-04-07 DIAGNOSIS — R159 Full incontinence of feces: Secondary | ICD-10-CM | POA: Diagnosis not present

## 2020-04-07 DIAGNOSIS — N183 Chronic kidney disease, stage 3 unspecified: Secondary | ICD-10-CM | POA: Diagnosis not present

## 2020-04-07 DIAGNOSIS — E119 Type 2 diabetes mellitus without complications: Secondary | ICD-10-CM | POA: Diagnosis not present

## 2020-04-07 DIAGNOSIS — K219 Gastro-esophageal reflux disease without esophagitis: Secondary | ICD-10-CM | POA: Diagnosis not present

## 2020-04-07 DIAGNOSIS — I87319 Chronic venous hypertension (idiopathic) with ulcer of unspecified lower extremity: Secondary | ICD-10-CM | POA: Diagnosis not present

## 2020-04-08 ENCOUNTER — Other Ambulatory Visit (HOSPITAL_COMMUNITY): Payer: Self-pay | Admitting: Interventional Radiology

## 2020-04-08 ENCOUNTER — Ambulatory Visit (HOSPITAL_COMMUNITY)
Admission: RE | Admit: 2020-04-08 | Discharge: 2020-04-08 | Disposition: A | Discharge status: Home / Self Care | Payer: Medicare Other | Source: Ambulatory Visit | Attending: Diagnostic Radiology | Admitting: Diagnostic Radiology

## 2020-04-08 ENCOUNTER — Other Ambulatory Visit: Payer: Self-pay

## 2020-04-08 DIAGNOSIS — K831 Obstruction of bile duct: Secondary | ICD-10-CM

## 2020-04-08 DIAGNOSIS — Z7401 Bed confinement status: Secondary | ICD-10-CM | POA: Diagnosis not present

## 2020-04-08 DIAGNOSIS — Z4803 Encounter for change or removal of drains: Secondary | ICD-10-CM | POA: Diagnosis not present

## 2020-04-08 DIAGNOSIS — R0902 Hypoxemia: Secondary | ICD-10-CM | POA: Diagnosis not present

## 2020-04-08 HISTORY — PX: IR EXCHANGE BILIARY DRAIN: IMG6046

## 2020-04-08 MED ORDER — IOHEXOL 300 MG/ML  SOLN
50.0000 mL | Freq: Once | INTRAMUSCULAR | Status: AC | PRN
  Administered 2020-04-08: 10 mL

## 2020-04-08 MED ORDER — LIDOCAINE HCL 1 % IJ SOLN
INTRAMUSCULAR | Status: AC
  Filled 2020-04-08: qty 20

## 2020-04-08 NOTE — Procedures (Signed)
Pre procedural Diagnosis: Biliary Obstruction Post procedural Diagnosis: Same  Successful fluroscopic exchange of a left sided approach transhepatic now 12 Fr biliary drainage catheter with end coiled and locked within the duodenum.  Biliary drain capped.   EBL: None No immediate post procedural complications.  Ronny Bacon, MD Pager #: [phone]

## 2020-04-10 DIAGNOSIS — K219 Gastro-esophageal reflux disease without esophagitis: Secondary | ICD-10-CM | POA: Diagnosis not present

## 2020-04-10 DIAGNOSIS — N183 Chronic kidney disease, stage 3 unspecified: Secondary | ICD-10-CM | POA: Diagnosis not present

## 2020-04-10 DIAGNOSIS — E119 Type 2 diabetes mellitus without complications: Secondary | ICD-10-CM | POA: Diagnosis not present

## 2020-04-10 DIAGNOSIS — M6284 Sarcopenia: Secondary | ICD-10-CM | POA: Diagnosis not present

## 2020-04-10 DIAGNOSIS — I87319 Chronic venous hypertension (idiopathic) with ulcer of unspecified lower extremity: Secondary | ICD-10-CM | POA: Diagnosis not present

## 2020-04-10 DIAGNOSIS — R159 Full incontinence of feces: Secondary | ICD-10-CM | POA: Diagnosis not present

## 2020-04-11 DIAGNOSIS — M6284 Sarcopenia: Secondary | ICD-10-CM | POA: Diagnosis not present

## 2020-04-11 DIAGNOSIS — N183 Chronic kidney disease, stage 3 unspecified: Secondary | ICD-10-CM | POA: Diagnosis not present

## 2020-04-11 DIAGNOSIS — R159 Full incontinence of feces: Secondary | ICD-10-CM | POA: Diagnosis not present

## 2020-04-11 DIAGNOSIS — E119 Type 2 diabetes mellitus without complications: Secondary | ICD-10-CM | POA: Diagnosis not present

## 2020-04-11 DIAGNOSIS — K219 Gastro-esophageal reflux disease without esophagitis: Secondary | ICD-10-CM | POA: Diagnosis not present

## 2020-04-11 DIAGNOSIS — I87319 Chronic venous hypertension (idiopathic) with ulcer of unspecified lower extremity: Secondary | ICD-10-CM | POA: Diagnosis not present

## 2020-04-15 DIAGNOSIS — I87319 Chronic venous hypertension (idiopathic) with ulcer of unspecified lower extremity: Secondary | ICD-10-CM | POA: Diagnosis not present

## 2020-04-15 DIAGNOSIS — N183 Chronic kidney disease, stage 3 unspecified: Secondary | ICD-10-CM | POA: Diagnosis not present

## 2020-04-15 DIAGNOSIS — R159 Full incontinence of feces: Secondary | ICD-10-CM | POA: Diagnosis not present

## 2020-04-15 DIAGNOSIS — K219 Gastro-esophageal reflux disease without esophagitis: Secondary | ICD-10-CM | POA: Diagnosis not present

## 2020-04-15 DIAGNOSIS — E119 Type 2 diabetes mellitus without complications: Secondary | ICD-10-CM | POA: Diagnosis not present

## 2020-04-15 DIAGNOSIS — M6284 Sarcopenia: Secondary | ICD-10-CM | POA: Diagnosis not present

## 2020-04-15 MED FILL — NORMAL SALINE FLUSH SYRINGE: 0.9 | 30 days supply | Qty: 300 | Fill #1

## 2020-04-16 DIAGNOSIS — R159 Full incontinence of feces: Secondary | ICD-10-CM | POA: Diagnosis not present

## 2020-04-16 DIAGNOSIS — I87319 Chronic venous hypertension (idiopathic) with ulcer of unspecified lower extremity: Secondary | ICD-10-CM | POA: Diagnosis not present

## 2020-04-16 DIAGNOSIS — E119 Type 2 diabetes mellitus without complications: Secondary | ICD-10-CM | POA: Diagnosis not present

## 2020-04-16 DIAGNOSIS — M6284 Sarcopenia: Secondary | ICD-10-CM | POA: Diagnosis not present

## 2020-04-16 DIAGNOSIS — N183 Chronic kidney disease, stage 3 unspecified: Secondary | ICD-10-CM | POA: Diagnosis not present

## 2020-04-16 DIAGNOSIS — K219 Gastro-esophageal reflux disease without esophagitis: Secondary | ICD-10-CM | POA: Diagnosis not present

## 2020-04-17 DIAGNOSIS — M6284 Sarcopenia: Secondary | ICD-10-CM | POA: Diagnosis not present

## 2020-04-17 DIAGNOSIS — N183 Chronic kidney disease, stage 3 unspecified: Secondary | ICD-10-CM | POA: Diagnosis not present

## 2020-04-17 DIAGNOSIS — I87319 Chronic venous hypertension (idiopathic) with ulcer of unspecified lower extremity: Secondary | ICD-10-CM | POA: Diagnosis not present

## 2020-04-17 DIAGNOSIS — K219 Gastro-esophageal reflux disease without esophagitis: Secondary | ICD-10-CM | POA: Diagnosis not present

## 2020-04-17 DIAGNOSIS — R159 Full incontinence of feces: Secondary | ICD-10-CM | POA: Diagnosis not present

## 2020-04-17 DIAGNOSIS — E119 Type 2 diabetes mellitus without complications: Secondary | ICD-10-CM | POA: Diagnosis not present

## 2020-04-21 ENCOUNTER — Ambulatory Visit (INDEPENDENT_AMBULATORY_CARE_PROVIDER_SITE_OTHER): Payer: Medicare Other

## 2020-04-21 DIAGNOSIS — I495 Sick sinus syndrome: Secondary | ICD-10-CM | POA: Diagnosis not present

## 2020-04-21 LAB — CUP PACEART REMOTE DEVICE CHECK
Battery Remaining Longevity: 118 mo
Battery Remaining Percentage: 95.5 %
Battery Voltage: 2.98 V
Brady Statistic AP VP Percent: 1 %
Brady Statistic AP VS Percent: 31 %
Brady Statistic AS VP Percent: 1 %
Brady Statistic AS VS Percent: 68 %
Brady Statistic RA Percent Paced: 1 %
Brady Statistic RV Percent Paced: 34 %
Date Time Interrogation Session: 20220328020015
Implantable Lead Implant Date: 20160421
Implantable Lead Implant Date: 20160421
Implantable Lead Location: 753859
Implantable Lead Location: 753860
Implantable Pulse Generator Implant Date: 20160421
Lead Channel Impedance Value: 340 Ohm
Lead Channel Impedance Value: 540 Ohm
Lead Channel Pacing Threshold Amplitude: 0.5 V
Lead Channel Pacing Threshold Amplitude: 1 V
Lead Channel Pacing Threshold Pulse Width: 0.5 ms
Lead Channel Pacing Threshold Pulse Width: 0.5 ms
Lead Channel Sensing Intrinsic Amplitude: 0.8 mV
Lead Channel Sensing Intrinsic Amplitude: 12 mV
Lead Channel Setting Pacing Amplitude: 2 V
Lead Channel Setting Pacing Amplitude: 2.5 V
Lead Channel Setting Pacing Pulse Width: 0.5 ms
Lead Channel Setting Sensing Sensitivity: 2 mV
Pulse Gen Model: 2240
Pulse Gen Serial Number: 7761558

## 2020-04-22 DIAGNOSIS — E119 Type 2 diabetes mellitus without complications: Secondary | ICD-10-CM | POA: Diagnosis not present

## 2020-04-22 DIAGNOSIS — M6284 Sarcopenia: Secondary | ICD-10-CM | POA: Diagnosis not present

## 2020-04-22 DIAGNOSIS — N183 Chronic kidney disease, stage 3 unspecified: Secondary | ICD-10-CM | POA: Diagnosis not present

## 2020-04-22 DIAGNOSIS — I87319 Chronic venous hypertension (idiopathic) with ulcer of unspecified lower extremity: Secondary | ICD-10-CM | POA: Diagnosis not present

## 2020-04-22 DIAGNOSIS — K219 Gastro-esophageal reflux disease without esophagitis: Secondary | ICD-10-CM | POA: Diagnosis not present

## 2020-04-22 DIAGNOSIS — R159 Full incontinence of feces: Secondary | ICD-10-CM | POA: Diagnosis not present

## 2020-04-25 DEATH — deceased

## 2020-04-30 NOTE — Progress Notes (Signed)
Remote pacemaker transmission.   

## 2020-07-01 ENCOUNTER — Other Ambulatory Visit (HOSPITAL_COMMUNITY): Payer: Medicare Other
